# Patient Record
Sex: Female | Born: 1990 | Race: Black or African American | Hispanic: No | Marital: Single | Smoking: Former smoker
Health system: Southern US, Community
[De-identification: ages and names within clinical notes are randomized; demographics above are authoritative.]

## PROBLEM LIST (undated history)

## (undated) DIAGNOSIS — F419 Anxiety disorder, unspecified: Secondary | ICD-10-CM

## (undated) DIAGNOSIS — E669 Obesity, unspecified: Secondary | ICD-10-CM

## (undated) DIAGNOSIS — G43909 Migraine, unspecified, not intractable, without status migrainosus: Secondary | ICD-10-CM

## (undated) DIAGNOSIS — I10 Essential (primary) hypertension: Secondary | ICD-10-CM

## (undated) DIAGNOSIS — F329 Major depressive disorder, single episode, unspecified: Secondary | ICD-10-CM

## (undated) DIAGNOSIS — L83 Acanthosis nigricans: Secondary | ICD-10-CM

## (undated) DIAGNOSIS — K219 Gastro-esophageal reflux disease without esophagitis: Secondary | ICD-10-CM

## (undated) DIAGNOSIS — Z9151 Personal history of suicidal behavior: Secondary | ICD-10-CM

## (undated) DIAGNOSIS — N946 Dysmenorrhea, unspecified: Secondary | ICD-10-CM

## (undated) DIAGNOSIS — Z915 Personal history of self-harm: Secondary | ICD-10-CM

## (undated) DIAGNOSIS — T7840XA Allergy, unspecified, initial encounter: Secondary | ICD-10-CM

## (undated) DIAGNOSIS — F32A Depression, unspecified: Secondary | ICD-10-CM

## (undated) HISTORY — DX: Gastro-esophageal reflux disease without esophagitis: K21.9

## (undated) HISTORY — DX: Dysmenorrhea, unspecified: N94.6

## (undated) HISTORY — DX: Allergy, unspecified, initial encounter: T78.40XA

## (undated) HISTORY — DX: Acanthosis nigricans: L83

## (undated) HISTORY — DX: Anxiety disorder, unspecified: F41.9

## (undated) HISTORY — DX: Migraine, unspecified, not intractable, without status migrainosus: G43.909

## (undated) HISTORY — DX: Depression, unspecified: F32.A

## (undated) HISTORY — DX: Personal history of suicidal behavior: Z91.51

## (undated) HISTORY — DX: Obesity, unspecified: E66.9

## (undated) HISTORY — DX: Personal history of self-harm: Z91.5

## (undated) HISTORY — DX: Major depressive disorder, single episode, unspecified: F32.9

---
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
## 2011-01-15 ENCOUNTER — Observation Stay: Payer: Self-pay | Admitting: Emergency Medicine

## 2011-01-16 ENCOUNTER — Ambulatory Visit: Payer: Self-pay | Admitting: Obstetrics & Gynecology

## 2011-03-06 ENCOUNTER — Observation Stay: Payer: Self-pay | Admitting: Obstetrics & Gynecology

## 2011-03-19 ENCOUNTER — Inpatient Hospital Stay: Payer: Self-pay

## 2012-06-02 ENCOUNTER — Emergency Department: Payer: Self-pay | Admitting: Emergency Medicine

## 2012-06-03 LAB — COMPREHENSIVE METABOLIC PANEL
Anion Gap: 10 (ref 7–16)
Calcium, Total: 8.9 mg/dL (ref 8.5–10.1)
Chloride: 106 mmol/L (ref 98–107)
Co2: 25 mmol/L (ref 21–32)
EGFR (African American): >60
Osmolality: 279 (ref 275–301)
Potassium: 3.6 mmol/L (ref 3.5–5.1)
SGOT(AST): 22 U/L (ref 15–37)
Sodium: 141 mmol/L (ref 136–145)

## 2012-06-03 LAB — CBC
MCH: 30.2 pg (ref 26.0–34.0)
MCHC: 34.7 g/dL (ref 32.0–36.0)
Platelet: 318 10*3/uL (ref 150–440)
RBC: 4.44 10*6/uL (ref 3.80–5.20)
RDW: 13.8 % (ref 11.5–14.5)
WBC: 7.8 10*3/uL (ref 3.6–11.0)

## 2012-06-03 LAB — URINALYSIS, COMPLETE
Bacteria: NONE SEEN
Bilirubin,UR: NEGATIVE
Protein: 100
RBC,UR: 10161 /HPF (ref 0–5)
Specific Gravity: 1.028 (ref 1.003–1.030)
Squamous Epithelial: 9

## 2012-11-30 ENCOUNTER — Emergency Department: Payer: Self-pay | Admitting: Emergency Medicine

## 2013-01-06 LAB — URINALYSIS, COMPLETE
Bilirubin,UR: NEGATIVE
Ketone: NEGATIVE
Protein: 30
RBC,UR: 86 /HPF (ref 0–5)
Specific Gravity: 1.032 (ref 1.003–1.030)

## 2013-01-06 LAB — COMPREHENSIVE METABOLIC PANEL
Albumin: 3.7 g/dL (ref 3.4–5.0)
Alkaline Phosphatase: 115 U/L (ref 50–136)
BUN: 8 mg/dL (ref 7–18)
Calcium, Total: 8.7 mg/dL (ref 8.5–10.1)
Chloride: 105 mmol/L (ref 98–107)
Co2: 27 mmol/L (ref 21–32)
Creatinine: 0.84 mg/dL (ref 0.60–1.30)
EGFR (Non-African Amer.): >60
Glucose: 84 mg/dL (ref 65–99)
SGPT (ALT): 15 U/L (ref 12–78)
Sodium: 137 mmol/L (ref 136–145)

## 2013-01-06 LAB — CBC
MCH: 28.7 pg (ref 26.0–34.0)
MCHC: 34.2 g/dL (ref 32.0–36.0)
MCV: 84 fL (ref 80–100)
Platelet: 335 10*3/uL (ref 150–440)
RBC: 4.45 10*6/uL (ref 3.80–5.20)
RDW: 14 % (ref 11.5–14.5)
WBC: 8.6 10*3/uL (ref 3.6–11.0)

## 2013-01-06 LAB — DRUG SCREEN, URINE
Benzodiazepine, Ur Scrn: NEGATIVE (ref ?–200)
MDMA (Ecstasy)Ur Screen: NEGATIVE (ref ?–500)
Tricyclic, Ur Screen: NEGATIVE (ref ?–1000)

## 2013-01-06 LAB — ACETAMINOPHEN LEVEL: Acetaminophen: 16 ug/mL

## 2013-01-06 LAB — ETHANOL
Ethanol %: <0.003 % (ref 0.000–0.080)
Ethanol: <3 mg/dL

## 2013-01-07 ENCOUNTER — Inpatient Hospital Stay: Payer: Self-pay | Admitting: Psychiatry

## 2013-10-28 ENCOUNTER — Emergency Department: Payer: Self-pay | Admitting: Emergency Medicine

## 2013-12-30 ENCOUNTER — Emergency Department: Payer: Self-pay

## 2014-01-02 LAB — BETA STREP CULTURE(ARMC)

## 2014-05-26 LAB — HM PAP SMEAR: HM PAP: NORMAL

## 2014-08-12 ENCOUNTER — Emergency Department: Payer: Self-pay | Admitting: Emergency Medicine

## 2014-12-03 ENCOUNTER — Emergency Department (HOSPITAL_COMMUNITY)
Admission: EM | Admit: 2014-12-03 | Discharge: 2014-12-03 | Disposition: A | Discharge status: Home / Self Care | Payer: No Typology Code available for payment source | Attending: Emergency Medicine | Admitting: Emergency Medicine

## 2014-12-03 ENCOUNTER — Encounter (HOSPITAL_COMMUNITY): Payer: Self-pay | Admitting: Emergency Medicine

## 2014-12-03 ENCOUNTER — Emergency Department (HOSPITAL_COMMUNITY): Payer: No Typology Code available for payment source

## 2014-12-03 DIAGNOSIS — S4992XA Unspecified injury of left shoulder and upper arm, initial encounter: Secondary | ICD-10-CM | POA: Insufficient documentation

## 2014-12-03 DIAGNOSIS — R52 Pain, unspecified: Secondary | ICD-10-CM

## 2014-12-03 DIAGNOSIS — Y9389 Activity, other specified: Secondary | ICD-10-CM | POA: Diagnosis not present

## 2014-12-03 DIAGNOSIS — Y998 Other external cause status: Secondary | ICD-10-CM | POA: Insufficient documentation

## 2014-12-03 DIAGNOSIS — Y92410 Unspecified street and highway as the place of occurrence of the external cause: Secondary | ICD-10-CM | POA: Diagnosis not present

## 2014-12-03 DIAGNOSIS — M7918 Myalgia, other site: Secondary | ICD-10-CM

## 2014-12-03 DIAGNOSIS — S5012XA Contusion of left forearm, initial encounter: Secondary | ICD-10-CM | POA: Insufficient documentation

## 2014-12-03 MED ORDER — HYDROCODONE-ACETAMINOPHEN 5-325 MG PO TABS: ORAL_TABLET | ORAL | Status: DC

## 2014-12-03 MED ORDER — HYDROCODONE-ACETAMINOPHEN 5-325 MG PO TABS
1.0000 | ORAL_TABLET | Freq: Once | ORAL | Status: AC
  Administered 2014-12-03: 1 via ORAL
  Filled 2014-12-03: qty 1
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
## 2014-12-03 NOTE — ED Notes (Signed)
Patient is alert and orientedx4.  Patient was explained discharge instructions and they understood them with no questions.  The patient's mother, Kristina Mccann is taking the patient home.
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
## 2014-12-03 NOTE — Discharge Instructions (Signed)
Rest, Ice intermittently (in the first 24-48 hours), Gentle compression with an Ace wrap, and elevate (Limb above the level of the heart)   Take up to 800mg  of ibuprofen (that is usually 4 over the counter pills)  3 times a day for 5 days. Take with food.  Take vicodin for breakthrough pain, do not drink alcohol, drive, care for children or do other critical tasks while taking vicodin.  Do not hesitate to return to the emergency room for any new, worsening or concerning symptoms.  Please obtain primary care using resource guide below. Please let them know that you were seen in the emergency room and that they will need to obtain records for further outpatient management.     Musculoskeletal Pain Musculoskeletal pain is muscle and boney aches and pains. These pains can occur in any part of the body. Your caregiver may treat you without knowing the cause of the pain. They may treat you if blood or urine tests, X-rays, and other tests were normal.  CAUSES There is often not a definite cause or reason for these pains. These pains may be caused by a type of germ (virus). The discomfort may also come from overuse. Overuse includes working out too hard when your body is not fit. Boney aches also come from weather changes. Bone is sensitive to atmospheric pressure changes. HOME CARE INSTRUCTIONS   Ask when your test results will be ready. Make sure you get your test results.  Only take over-the-counter or prescription medicines for pain, discomfort, or fever as directed by your caregiver. If you were given medications for your condition, do not drive, operate machinery or power tools, or sign legal documents for 24 hours. Do not drink alcohol. Do not take sleeping pills or other medications that may interfere with treatment.  Continue all activities unless the activities cause more pain. When the pain lessens, slowly resume normal activities. Gradually increase the intensity and duration of the  activities or exercise.  During periods of severe pain, bed rest may be helpful. Lay or sit in any position that is comfortable.  Putting ice on the injured area.  Put ice in a bag.  Place a towel between your skin and the bag.  Leave the ice on for 15 to 20 minutes, 3 to 4 times a day.  Follow up with your caregiver for continued problems and no reason can be found for the pain. If the pain becomes worse or does not go away, it may be necessary to repeat tests or do additional testing. Your caregiver may need to look further for a possible cause. SEEK IMMEDIATE MEDICAL CARE IF:  You have pain that is getting worse and is not relieved by medications.  You develop chest pain that is associated with shortness or breath, sweating, feeling sick to your stomach (nauseous), or throw up (vomit).  Your pain becomes localized to the abdomen.  You develop any new symptoms that seem different or that concern you. MAKE SURE YOU:   Understand these instructions.  Will watch your condition.  Will get help right away if you are not doing well or get worse. Document Released: 07/30/2005 Document Revised: 10/22/2011 Document Reviewed: 04/03/2013 [facility] Patient Information 2015 [place], [place]. This information is not intended to replace advice given to you by your health care provider. Make sure you discuss any questions you have with your health care provider. Emergency Department Resource Guide 1) Find a Doctor and Pay Out of Pocket Although you won't have  to find out who is covered by your insurance plan, it is a good idea to ask around and get recommendations. You will then need to call the office and see if the doctor you have chosen will accept you as a new patient and what types of options they offer for patients who are self-pay. Some doctors offer discounts or will set up payment plans for their patients who do not have insurance, but you will need to ask so you aren't surprised when you  get to your appointment.  2) Contact Your Local Health Department Not all health departments have doctors that can see patients for sick visits, but many do, so it is worth a call to see if yours does. If you don't know where your local health department is, you can check in your phone book. The CDC also has a tool to help you locate your state's health department, and many state websites also have listings of all of their local health departments.  3) Find a Walk-in Clinic If your illness is not likely to be very severe or complicated, you may want to try a walk in clinic. These are popping up all over the country in pharmacies, drugstores, and shopping centers. They're usually staffed by nurse practitioners or physician assistants that have been trained to treat common illnesses and complaints. They're usually fairly quick and inexpensive. However, if you have serious medical issues or chronic medical problems, these are probably not your best option.  No Primary Care Doctor: - Call Health Connect at  [phone] - they can help you locate a primary care doctor that  accepts your insurance, provides certain services, etc. - Physician Referral Service- [phone]  Chronic Pain Problems: Organization         Address  Phone   Notes  [facility]  [phone] Patients need to be referred by their primary care doctor.   Medication Assistance: Organization         Address  Phone   Notes  [facility] Medication [facility] [address] [place] [place], [place] [address] [phone] --Must be a resident of [facility] -- Must have NO insurance coverage whatsoever (no Medicaid/ Medicare, etc.) -- The pt. MUST have a primary care doctor that directs their care regularly and follows them in the community   [facility]  [phone]   [employer]  [phone]    Agencies that provide inexpensive medical care: Organization         Address  Phone    Notes  [facility]  [phone]   [facility]    [phone]   [facility] [address] [place], [place] [address] [phone]   [facility] [address] [place], [place] [phone]   Planned Parenthood    [phone]   [facility]    [phone]   [facility]  [address], [place] Phone:  [phone], Fax:  [phone] Hours of Operation:  9 am - 6 pm, M-F.  Also accepts Medicaid/Medicare and self-pay.  [facility]  [address], [place] Phone: [phone], Fax: [phone]. Hours of Operation:  8:30 am - 5:30 pm, M-F.  Also accepts Medicaid and self-pay.  [facility] [place] [address], [employer] Phone: [phone]   [facility] [address] [place],  [place], [place] [phone], Ext. [phone] Mondays & Thursdays: 7-9 AM.  First 15 patients are seen on a first come, first serve basis.    Medicaid-accepting [facility] Providers:  Organization         Address  Phone   Notes  [facility] [address], [place] [phone] Also accepts self-pay patients.  [facility] [address], [place]  [phone]) [phone]   [facility] [address], [place] [phone]) [phone]   [facility] [address] [place] [address], [place] [phone]) [phone]   Renaye RakersVeita Bland [address], [place]   [phone] Only accepts [place] Access [place] patients after they have their name applied to their card.   Self-Pay (no insurance) in [facility]:  Organization         Address  Phone   Notes  Sickle Cell Patients, [facility] [address], [place] [phone]) [phone]   [facility] [address] [place], [place] [phone]) [phone]   [facility] [place]  [address], [place] [phone]) [phone]   [facility]/Dr. Osei-Bonsu  [address] [place] [address], [place] or [phone], [place] [phone] Phone number for both [place] and [place] locations is the same.  [facility] [address], [place] [phone]) [phone]   [facility] [address] [place] [address], [place] or [address] [phone](336) [phone]   [facility] [address], [place] [phone], phone; [phone]) [phone], fax Sees patients 1st and 3rd Saturday of every month.  Must not qualify for public or private insurance (i.e. Medicaid, Medicare, [place] Health Choice, Veterans' Benefits)  Household income should be no more than 200% of the poverty level The clinic cannot treat you if you are pregnant or think you are pregnant  Sexually transmitted diseases are not treated at the clinic.    Dental Care: Organization         Address  Phone  Notes  [facility] [address], [place] [phone] Accepts children up to age 23 who are enrolled in [place] or [place] Health Choice; pregnant women with a Medicaid card; and children who have applied for Medicaid or [place] Health Choice, but were declined, whose parents can pay a reduced fee at time of service.  [facility] [place]  [address], [place] [phone] Accepts children up to age 23 who are enrolled in [place] or [place] Health Choice; pregnant women with a Medicaid card; and children who have applied for Medicaid or [place] Health Choice, but were declined, whose parents can pay a reduced fee at time of service.  [facility]  [address], [place] [phone]) [phone] Patients are seen by appointment only. Walk-ins are not accepted. [facility] will see patients 23 years of age  and older. Monday - Tuesday (8am-5pm) Most Wednesdays (8:30-5pm) $30 per visit, cash only  [facility]  [address], [facility] [phone]) [phone] Patients are seen by appointment only. Walk-ins are not accepted. [facility] will see patients 23 years of age and older. One Wednesday Evening (Monthly: Volunteer Based).  $30 per visit, cash only  [employer]  Clinics  [phone] for adults; Children under age 51, call Graduate Pediatric Dentistry at [phone]. Children aged 77-14, please call [phone] to request a pediatric application.  Dental services are provided in all areas of dental care including fillings, crowns and bridges, complete and partial dentures, implants, gum treatment, root canals, and extractions. Preventive care is also provided. Treatment is provided to both adults and children. Patients are selected via a lottery and there is often a waiting list.   [facility] [address], [place]  [phone] [email or web address]   [facility] [address] [place] [address] [place], [place] [phone], Ext. [phone] Second and Fourth Thursday of each month, opens at 6:30 AM; Clinic ends at 9 AM.  Patients are seen on a first-come first-served basis, and a limited number are seen during each clinic.   [facility]  [address] [place], [place] [phone]   Eligibility Requirements You must have lived in [place], [place], or [place] counties for at least the last three months.   You cannot be eligible for state or federal sponsored [place], including [employer], [place], or [employer].   You generally cannot be eligible for healthcare insurance through your employer.    How to apply: Eligibility screenings are held every Tuesday and Wednesday afternoon from 1:00 pm until 4:00 pm. You do not need an appointment for the interview!  [facility] [address], [place], [place] [phone]   [facility] Health Department  [phone]   [facility] Health Department  [phone]   [facility] Health Department  [phone]    Behavioral Health Resources in the Community: Intensive Outpatient Programs Organization         Address  Phone  Notes  [facility] [address], [place], [place] [phone]   [facility] [address] [place], [place] [phone]   [facility] [address], [place], [place]  [phone]   [facility] Mental Health [address]  [place], [place] [phone] or [phone]   Substance Abuse Resources Organization         Address  Phone  Notes  [facility] Drug Services  9522689699   [facility]  [phone]   [facility]  [phone]   Floydene Flock  [phone]   Residential & Outpatient Substance Abuse Program  [phone]   Psychological Services Organization         Address  Phone  Notes  [facility] Behavioral Health  [phone]   [facility] Services  [phone]   [facility] Mental Health [address], [place] [phone] or [phone]    Mobile Crisis Teams Organization         Address  Phone  Notes  [facility], Mobile Crisis Care Unit  [phone]   [facility]  [address] [place], [place] [phone]   Doristine Locks [address] [place] [phone]    Self-Help/Support Groups Organization         Address  Phone             Notes  [facility] [place] - variety of support groups  336- I7437963 Call for more information  Narcotics Anonymous (NA), [facility] [address], [employer] [place]  2 meetings at this location   Chief Executive Officer  Notes  [facility] [address],    [place]  [phone]   [facility]  [address] [place] [address], [place], [place] [phone]   [facility] [address] [place], [place] [phone] Admissions: 8am-3pm M-F  [facility] [address]    [place], [place] [phone]   [facility] [address] [place], [place], [place] [phone]   [facility] [address]  [place], [place] [phone]   [facility] - Intensive Outpatient [address], [place], [place] [phone]   [facility] ([facility] [address] [place].,  [place], [place] [phone] or [phone]   [facility] ([facility]) [address], [place], [place] [phone] Accepts Medicaid  [facility] [address]  [place] [phone] Substance Abuse/Addiction Treatment   [facility] Organization         Address  Phone  Notes  [facility]  [phone]   Angie Fava, PhD [address] [place], [place]   [phone] or [phone]   [facility]   [address] [place], [place] [phone]   [facility] [address] [place], [place] [phone] Insurance/Medicaid/sponsorship through [facility] and Families [address]                                    [place], [place] [phone] Therapy/tele-psych/case  [facility] [address] [place] [address] [place], [place] [phone]    Dr. Lolly Mustache  [phone]   [facility]  [employer] [facility] Dept. 1) [address] [place] 2) [address], [place] 3)  [address], [place] [phone]  [phone]   [facility] Child Abuse Hotline 854-129-0629 or [phone] (After Hours)

## 2014-12-03 NOTE — Discharge Summary (Signed)
PATIENT NAME:  Kristina Mccann, Loraina L MR#:  [id number] DATE OF BIRTH:  Oct 16, 1990  DATE OF ADMISSION:  01/07/2013  DATE OF DISCHARGE:  01/09/2013  HOSPITAL COURSE:  See dictated history and physical for details of admission. A 24 year old woman who was admitted through the emergency room where she presented after taking an overdose of pain medicine. She was reporting long-standing stress and recently worsening depression with acute problems with breakup of her fiancee. The patient had taken an impulsive overdose, but then immediately had notified people and had cooperated with getting treatment. In the hospital, the patient denied any suicidal ideation. She recognized that her behavior was impulsive and inappropriate. She gave a history of long-standing depression and probably PTSD. She was willing to cooperate with treatment and involved in groups and individual therapy appropriately. She was started on Prozac 20 mg a day, which she has tolerated well. At the time of discharge, the patient is reporting that her mood is upbeat and no longer feeling depressed. She is denying suicidal or homicidal ideation and expressing optimism about going home. She is able to cite things in her life, mostly her child, that are important and worth living for. The patient is [facility]. She also has had a urinary tract infection that has been treated.   DISCHARGE MEDICATION:  1.  Prozac 20 mg a day.  2.  Trazodone 100 mg at night p.r.n. for sleep.  3.  Septra 1 tablet twice a day for 3 more days.   LABORATORY RESULTS:  Admission labs showed a normal CBC. Chemistry is just with a low AST and total protein slightly. Alcohol level undetectable. TSH normal at 1.9. Urinalysis:  Leukocyte esterase +3 plus, positive white and red blood cells and bacteria. Drug screen positive for opiates related to the medicine she had overdosed on. Salicylates and acetaminophen nontoxic. Pregnancy test negative.   MENTAL  STATUS EXAM AT DISCHARGE:  Neatly dressed and groomed young woman, looks her stated age, cooperative and pleasant in the interview. Good eye contact. Normal psychomotor activity. Speech normal in rate, tone and volume. Affect:  Smiling, upbeat, appropriate. Mood stated as good. Thoughts appear to be lucid, without any loosening of associations or delusions. Denies auditory or visual hallucinations. Denies suicidal or homicidal ideation currently. Shows good insight and judgment. Alert and oriented x 4. Normal intelligence.   DIAGNOSIS, PRINCIPAL AND PRIMARY:  AXIS I:  Major depression, recurrent, moderate.   SECONDARY DIAGNOSES: AXIS I:  Rule out posttraumatic stress disorder.   AXIS II:  Deferred.   AXIS III:  Urinary tract infection.   AXIS IV:  Severe from isolation, being a single mother, working.   AXIS V:  Functioning at time of evaluation and discharge 60.      ____________________________ Audery AmelJohn T. Asani Deniston, MD jtc:nts D: 01/09/2013 17:09:00 ET T: 01/10/2013 07:48:17 ET JOB#: [id number]  cc: Audery AmelJohn T. Amalya Salmons, MD, <Dictator> Audery AmelJOHN T Amyr Sluder MD ELECTRONICALLY SIGNED 01/12/2013 10:51

## 2015-01-26 ENCOUNTER — Emergency Department
Admission: EM | Admit: 2015-01-26 | Discharge: 2015-01-26 | Disposition: A | Discharge status: Home / Self Care | Payer: 59 | Attending: Emergency Medicine | Admitting: Emergency Medicine

## 2015-01-26 ENCOUNTER — Encounter: Payer: Self-pay | Admitting: Emergency Medicine

## 2015-01-26 DIAGNOSIS — N611 Abscess of the breast and nipple: Secondary | ICD-10-CM

## 2015-01-26 DIAGNOSIS — N61 Inflammatory disorders of breast: Secondary | ICD-10-CM | POA: Diagnosis present

## 2015-01-26 MED ORDER — SULFAMETHOXAZOLE-TRIMETHOPRIM 800-160 MG PO TABS: 1.0000 | ORAL_TABLET | Freq: Two times a day (BID) | ORAL | Status: DC

## 2015-01-26 NOTE — Discharge Instructions (Signed)

## 2015-01-26 NOTE — ED Provider Notes (Signed)
[facility] Emergency Department Provider Note  ____________________________________________  Time seen: Approximately 7:21 PM  I have reviewed the triage vital signs and the nursing notes.   HISTORY  Chief Complaint Abscess    HPI Kristina Mccann is a 24 y.o. female resistance to the emergency department for an abscess on the left breast. She states that it better for about a week and that on Sunday it popped and has been having clear or yellow drainage since then. She denies having had abscesses in the past. She reports that she is developing a second one under the breast. She tells me that the areas are not excessively painful.   History reviewed. No pertinent past medical history.  There are no active problems to display for this patient.   Past Surgical History  Procedure Laterality Date  . Cesarean section    . Breast lumpectomy Left 2008    Current Outpatient Rx  Name  Route  Sig  Dispense  Refill  . HYDROcodone-acetaminophen (NORCO/VICODIN) 5-325 MG per tablet      Take 1-2 tablets by mouth every 6 hours as needed for pain and/or cough.   7 tablet   0   . sulfamethoxazole-trimethoprim (BACTRIM DS,SEPTRA DS) 800-160 MG per tablet   Oral   Take 1 tablet by mouth 2 (two) times daily.   20 tablet   0     Allergies Review of patient's allergies indicates no known allergies.  No family history on file.  Social History History  Substance Use Topics  . Smoking status: Never Smoker   . Smokeless tobacco: Not on file  . Alcohol Use: No    Review of Systems   Constitutional: No fever/chills Eyes: No visual changes. ENT: No cough Cardiovascular: Denies chest pain. Respiratory: Denies shortness of breath. Gastrointestinal: No abdominal pain.  No nausea, no vomiting.  No diarrhea.  No constipation. Genitourinary: Negative for dysuria. Musculoskeletal: Negative for back pain. Skin: Abscesses to the left breast Neurological:  Negative for headaches, focal weakness or numbness.  10-point ROS otherwise negative.  ____________________________________________   PHYSICAL EXAM:  VITAL SIGNS: ED Triage Vitals  Enc Vitals Group     BP 01/26/15 1751 160/114 mmHg     Pulse Rate 01/26/15 1751 107     Resp 01/26/15 1751 19     Temp 01/26/15 1751 98.6 F (37 C)     Temp Source 01/26/15 1751 Oral     SpO2 01/26/15 1751 97 %     Weight 01/26/15 1751 204 lb (92.534 kg)     Height 01/26/15 1751  (1.575 m)     Head Cir --      Peak Flow --      Pain Score 01/26/15 1752 0     Pain Loc --      Pain Edu? --      Excl. in GC? --     Constitutional: Alert and oriented. Well appearing and in no acute distress. Eyes: Conjunctivae are normal. PERRL. EOMI. Head: Atraumatic. Nose: No congestion/rhinnorhea. Mouth/Throat: Mucous membranes are moist.  Oropharynx non-erythematous. No oral lesions. Neck: No stridor. Cardiovascular: Normal rate, regular rhythm.  Good peripheral circulation. Respiratory: Normal respiratory effort.  No retractions. Lungs CTAB. Gastrointestinal: Soft and nontender. No distention. No abdominal bruits.  Musculoskeletal: No lower extremity tenderness nor edema.  No joint effusions. Neurologic:  Normal speech and language. No gross focal neurologic deficits are appreciated. Speech is normal. No gait instability. Skin:  Rash no; 1 cm  abscess present to approximate 4:00 position outside of the aerola that is draining clear/purulent drainage--no induration or erythema outside of the abscess, less than 1 cm area of fluctuance present under her left breast; Negative for petechiae.  Psychiatric: Mood and affect are normal. Speech and behavior are normal.  ____________________________________________   LABS (all labs ordered are listed, but only abnormal results are displayed)  Labs Reviewed - No data to  display ____________________________________________  EKG   ____________________________________________  RADIOLOGY  Not indicated ____________________________________________   PROCEDURES  Procedure(s) performed: None ____________________________________________   INITIAL IMPRESSION / ASSESSMENT AND PLAN / ED COURSE  Pertinent labs & imaging results that were available during my care of the patient were reviewed by me and considered in my medical decision making (see chart for details).  Patient was given return precautions. She was advised to finish the antibiotics even if feeling better before she runs out. Patient declined pain medication today. ____________________________________________   FINAL CLINICAL IMPRESSION(S) / ED DIAGNOSES  Final diagnoses:  Breast abscess of female      Chinita Pester, FNP 01/26/15 2015  Minna Antis, MD 01/26/15 [phone]

## 2015-01-26 NOTE — ED Notes (Signed)
Pt with c/o bump to left nipple; reports it appeared 1 week ago, reports it "popped" Sunday, and has been leaking since.

## 2015-04-11 ENCOUNTER — Encounter: Payer: Self-pay | Admitting: Family Medicine

## 2015-04-11 ENCOUNTER — Ambulatory Visit (INDEPENDENT_AMBULATORY_CARE_PROVIDER_SITE_OTHER): Payer: 59 | Admitting: Family Medicine

## 2015-04-11 VITALS — BP 126/72 | HR 89 | Temp 98.5°F | Resp 16 | Ht 62.0 in | Wt 205.8 lb

## 2015-04-11 DIAGNOSIS — Z9151 Personal history of suicidal behavior: Secondary | ICD-10-CM | POA: Insufficient documentation

## 2015-04-11 DIAGNOSIS — J302 Other seasonal allergic rhinitis: Secondary | ICD-10-CM | POA: Insufficient documentation

## 2015-04-11 DIAGNOSIS — M25562 Pain in left knee: Secondary | ICD-10-CM | POA: Diagnosis not present

## 2015-04-11 DIAGNOSIS — Z23 Encounter for immunization: Secondary | ICD-10-CM

## 2015-04-11 DIAGNOSIS — IMO0002 Reserved for concepts with insufficient information to code with codable children: Secondary | ICD-10-CM | POA: Insufficient documentation

## 2015-04-11 DIAGNOSIS — F329 Major depressive disorder, single episode, unspecified: Secondary | ICD-10-CM | POA: Insufficient documentation

## 2015-04-11 DIAGNOSIS — Z915 Personal history of self-harm: Secondary | ICD-10-CM | POA: Insufficient documentation

## 2015-04-11 DIAGNOSIS — M25569 Pain in unspecified knee: Secondary | ICD-10-CM | POA: Insufficient documentation

## 2015-04-11 DIAGNOSIS — G43009 Migraine without aura, not intractable, without status migrainosus: Secondary | ICD-10-CM | POA: Insufficient documentation

## 2015-04-11 DIAGNOSIS — L83 Acanthosis nigricans: Secondary | ICD-10-CM | POA: Insufficient documentation

## 2015-04-11 DIAGNOSIS — K219 Gastro-esophageal reflux disease without esophagitis: Secondary | ICD-10-CM | POA: Insufficient documentation

## 2015-04-11 MED ORDER — MELOXICAM 15 MG PO TABS: 15.0000 mg | ORAL_TABLET | Freq: Every day | ORAL | Status: DC

## 2015-04-11 NOTE — Progress Notes (Signed)
Name: Kristina Mccann   MRN: [id number]    DOB: 03-27-1991   Date:04/11/2015       Progress Note  Subjective  Chief Complaint  Chief Complaint  Patient presents with  . Knee Pain    Onset-12 years ago, patient was at skating ring and fell, patient states her knee cracked and when outwards. Getting worst, intermittently pain/aching when squatting and using her knee bothers patient. Patient never had knee checked out when she was 12 from the fall.  Patient has used a knee brace with no relief.    HPI  Left Anterior Knee pain: Pain is described as an aching pain, constant. At rest the pain can go down to 2/10 but during activity up to 9/10. . Occasionally pain is sharp and shooting. No effusion or redness, no increase in warmth. Pain is worse with squatting, prolonged standing, walking , going up and down stairs. Sometimes knee locks, and is causing difficulty squatting.    Patient Active Problem List   Diagnosis Date Noted  . Acanthosis nigricans 04/11/2015  . Anxiety and depression 04/11/2015  . Gastro-esophageal reflux disease without esophagitis 04/11/2015  . Anterior knee pain 04/11/2015  . H/O suicide attempt 04/11/2015  . Migraine without aura and responsive to treatment 04/11/2015  . Adult BMI 30+ 04/11/2015  . Seasonal allergic rhinitis 04/11/2015    Past Surgical History  Procedure Laterality Date  . Cesarean section    . Breast lumpectomy Left 2008    Family History  Problem Relation Age of Onset  . Asthma Brother     Social History   Social History  . Marital Status: Single    Spouse Name: N/A  . Number of Children: N/A  . Years of Education: N/A   Occupational History  . Not on file.   Social History Main Topics  . Smoking status: Current Some Day Smoker -- 3 years    Types: Cigarettes    Start date: 04/10/2012  . Smokeless tobacco: Never Used  . Alcohol Use: No  . Drug Use: No  . Sexual Activity:    Partners: Male    Birth Control/ Protection:  Implant     Comment: Implanon   Other Topics Concern  . Not on file   Social History Narrative     Current outpatient prescriptions:  .  etonogestrel (IMPLANON) 68 MG IMPL implant, Inject into the skin., Disp: , Rfl:  .  meloxicam (MOBIC) 15 MG tablet, Take 1 tablet (15 mg total) by mouth daily., Disp: 30 tablet, Rfl: 0  No Known Allergies   ROS  Constitutional: Negative for fever or weight change.  Respiratory: Negative for cough and shortness of breath.   Cardiovascular: Negative for chest pain or palpitations.  Gastrointestinal: Negative for abdominal pain, no bowel changes.  Musculoskeletal: Negative for gait problem or joint swelling.  Skin: Negative for rash.  Neurological: Negative for dizziness or headache.  No other specific complaints in a complete review of systems (except as listed in HPI above).  Objective  Filed Vitals:   04/11/15 1211  BP: 126/72  Pulse: 89  Temp: 98.5 F (36.9 C)  TempSrc: Oral  Resp: 16  Height:  (1.575 m)  Weight: 205 lb 12.8 oz (93.35 kg)  SpO2: 99%    Body mass index is 37.63 kg/(m^2).  Physical Exam  Constitutional: Patient appears well-developed and well-nourished. Obese  No distress.  HEENT: head atraumatic, normocephalic, pupils equal and reactive to light,  neck supple, throat within normal  limits Cardiovascular: Normal rate, regular rhythm and normal heart sounds.  No murmur heard. No BLE edema. Pulmonary/Chest: Effort normal and breath sounds normal. No respiratory distress. Abdominal: Soft.  There is no tenderness. Psychiatric: Patient has a normal mood and affect. behavior is normal. Judgment and thought content normal. Muscular Skeletal: normal left knee exam, no effusion, normal rom, no griding Skin: acanthosis nigricans neck  PHQ2/9: Depression screen PHQ 2/9 04/11/2015  Decreased Interest 0  Down, Depressed, Hopeless 0  PHQ - 2 Score 0    Fall Risk: Fall Risk  04/11/2015  Falls in the past year? No     Assessment & Plan  1. Anterior knee pain, left History of trauma and pain going on for year, but getting worse, start nsaid's, discuss x-ray or referral to Ortho and she chose the later - meloxicam (MOBIC) 15 MG tablet; Take 1 tablet (15 mg total) by mouth daily.  Dispense: 30 tablet; Refill: 0 - Ambulatory referral to Orthopedic Surgery  2. Need for vaccination for H flu type B  - Flu Vaccine QUAD 36+ mos PF IM (Fluarix & Fluzone Quad PF)

## 2015-06-21 ENCOUNTER — Encounter: Payer: 59 | Admitting: Family Medicine

## 2016-03-13 LAB — HM HIV SCREENING LAB: HM HIV Screening: NEGATIVE

## 2016-03-20 ENCOUNTER — Encounter: Payer: Self-pay | Admitting: Family Medicine

## 2016-03-20 ENCOUNTER — Ambulatory Visit (INDEPENDENT_AMBULATORY_CARE_PROVIDER_SITE_OTHER): Payer: Self-pay | Admitting: Family Medicine

## 2016-03-20 VITALS — BP 122/102 | HR 85 | Temp 98.4°F | Resp 18 | Ht 62.0 in | Wt 199.1 lb

## 2016-03-20 DIAGNOSIS — Z9151 Personal history of suicidal behavior: Secondary | ICD-10-CM

## 2016-03-20 DIAGNOSIS — E668 Other obesity: Secondary | ICD-10-CM

## 2016-03-20 DIAGNOSIS — IMO0002 Reserved for concepts with insufficient information to code with codable children: Secondary | ICD-10-CM

## 2016-03-20 DIAGNOSIS — Z915 Personal history of self-harm: Secondary | ICD-10-CM

## 2016-03-20 DIAGNOSIS — Z9189 Other specified personal risk factors, not elsewhere classified: Secondary | ICD-10-CM

## 2016-03-20 DIAGNOSIS — R5383 Other fatigue: Secondary | ICD-10-CM

## 2016-03-20 DIAGNOSIS — F411 Generalized anxiety disorder: Secondary | ICD-10-CM

## 2016-03-20 DIAGNOSIS — F329 Major depressive disorder, single episode, unspecified: Secondary | ICD-10-CM

## 2016-03-20 DIAGNOSIS — Z113 Encounter for screening for infections with a predominantly sexual mode of transmission: Secondary | ICD-10-CM

## 2016-03-20 DIAGNOSIS — L83 Acanthosis nigricans: Secondary | ICD-10-CM

## 2016-03-20 LAB — COMPLETE METABOLIC PANEL WITH GFR
ALT: 8 U/L (ref 6–29)
AST: 13 U/L (ref 10–30)
Albumin: 3.8 g/dL (ref 3.6–5.1)
Alkaline Phosphatase: 83 U/L (ref 33–115)
BUN: 14 mg/dL (ref 7–25)
CHLORIDE: 102 mmol/L (ref 98–110)
CO2: 21 mmol/L (ref 20–31)
Calcium: 9.2 mg/dL (ref 8.6–10.2)
Creat: 0.95 mg/dL (ref 0.50–1.10)
GFR, Est Non African American: 84 mL/min (ref >=60)
Glucose, Bld: 79 mg/dL (ref 65–99)
POTASSIUM: 4.2 mmol/L (ref 3.5–5.3)
Sodium: 138 mmol/L (ref 135–146)
Total Bilirubin: 0.2 mg/dL (ref 0.2–1.2)
Total Protein: 7 g/dL (ref 6.1–8.1)

## 2016-03-20 LAB — CBC WITH DIFFERENTIAL/PLATELET
Basophils Absolute: 0 cells/uL (ref 0–200)
Basophils Relative: 0 %
Eosinophils Absolute: 231 cells/uL (ref 15–500)
Eosinophils Relative: 3 %
HEMATOCRIT: 37.2 % (ref 35.0–45.0)
Hemoglobin: 12.2 g/dL (ref 11.7–15.5)
LYMPHS PCT: 34 %
Lymphs Abs: 2618 cells/uL (ref 850–3900)
MCH: 28.7 pg (ref 27.0–33.0)
MCHC: 32.8 g/dL (ref 32.0–36.0)
MCV: 87.5 fL (ref 80.0–100.0)
MONO ABS: 462 {cells}/uL (ref 200–950)
MONOS PCT: 6 %
MPV: 10.6 fL (ref 7.5–12.5)
NEUTROS ABS: 4389 {cells}/uL (ref 1500–7800)
NEUTROS PCT: 57 %
PLATELETS: 331 10*3/uL (ref 140–400)
RBC: 4.25 MIL/uL (ref 3.80–5.10)
RDW: 14.3 % (ref 11.0–15.0)
WBC: 7.7 10*3/uL (ref 3.8–10.8)

## 2016-03-20 LAB — VITAMIN B12: VITAMIN B 12: 274 pg/mL (ref 200–1100)

## 2016-03-20 LAB — TSH: TSH: 1.09 mIU/L

## 2016-03-20 MED ORDER — DULOXETINE HCL 30 MG PO CPEP: 30.0000 mg | ORAL_CAPSULE | Freq: Every day | ORAL | 0 refills | Status: DC

## 2016-03-20 NOTE — Progress Notes (Signed)
Name: Kristina Mccann   MRN: [id number]    DOB: [birth year]   Date:03/20/2016       Progress Note  Subjective  Chief Complaint  Chief Complaint  Patient presents with  . Depression  . Anxiety    HPI  GAD/Major Depression: she has a history of major depression and suicide attempt. She is back with boyfriend Apolinar Junes ) . They were together for 3 years , they had a child together ( Amora ). He left when she was almost 3 years. They were separated for another 3 years, but back together since Feb. They moved to [place] 2 months ago. He is still there working and she came home because she was getting more depressed, and will return at the end of the month. She has seen a PCP in [place], but unable to afford the medication or counseling because she does not have Medicaid yet. She denies suicidal planning but feels like everyone would be better off without her.   Acanthosis Nigricans: she is obese, bp is elevated, she denies polyphagia, polyuria or polydipsia.   Obesity: she eats when stressed and is having a hard time controlling her intake, sometimes she skips meals because of the depression and lack of motivation to cook or prepare meals.    Patient Active Problem List   Diagnosis Date Noted  . Acanthosis nigricans 04/11/2015  . Major depression, chronic (HCC) 04/11/2015  . Gastro-esophageal reflux disease without esophagitis 04/11/2015  . Anterior knee pain 04/11/2015  . H/O suicide attempt 04/11/2015  . Migraine without aura and responsive to treatment 04/11/2015  . Adult BMI 30+ 04/11/2015  . Seasonal allergic rhinitis 04/11/2015    Past Surgical History:  Procedure Laterality Date  . BREAST LUMPECTOMY Left 2008  . CESAREAN SECTION      Family History  Problem Relation Age of Onset  . Asthma Brother     Social History   Social History  . Marital status: Single    Spouse name: N/A  . Number of children: N/A  . Years of education: N/A   Occupational History  . Not  on file.   Social History Main Topics  . Smoking status: Current Some Day Smoker    Years: 3.00    Types: Cigarettes    Start date: 04/10/2012  . Smokeless tobacco: Never Used  . Alcohol use No  . Drug use: No  . Sexual activity: Yes    Partners: Male    Birth control/ protection: Implant     Comment: Implanon   Other Topics Concern  . Not on file   Social History Narrative  . No narrative on file     Current Outpatient Prescriptions:  .  etonogestrel (IMPLANON) 68 MG IMPL implant, Inject into the skin., Disp: , Rfl:  .  DULoxetine (CYMBALTA) 30 MG capsule, Take 1-2 capsules (30-60 mg total) by mouth daily. Start with one capsule daily and increase to 2 pills after the first week, Disp: 60 capsule, Rfl: 0  No Known Allergies   ROS  Constitutional: Negative for fever or weight change.  Respiratory: Negative for cough and shortness of breath.   Cardiovascular: Negative for chest pain or palpitations.  Gastrointestinal: Negative for abdominal pain, no bowel changes.  Musculoskeletal: Negative for gait problem or joint swelling.  Skin: Negative for rash.  Neurological: Negative for dizziness or headache.  No other specific complaints in a complete review of systems (except as listed in HPI above).  Objective  Vitals:  03/20/16 1016  BP: (!) 122/102  Pulse: 85  Resp: 18  Temp: 98.4 F (36.9 C)  SpO2: 98%  Weight: 199 lb 1 oz (90.3 kg)  Height: 5\' 2"  (1.575 m)    Body mass index is 36.41 kg/m.  Physical Exam  Constitutional: Patient appears well-developed and well-nourished. Obese No distress.  HEENT: head atraumatic, normocephalic, pupils equal and reactive to light, neck supple, throat within normal limits Cardiovascular: Normal rate, regular rhythm and normal heart sounds.  No murmur heard. No BLE edema. Pulmonary/Chest: Effort normal and breath sounds normal. No respiratory distress. Abdominal: Soft.  There is no tenderness. Psychiatric: Patient has a  normal mood and affect. behavior is normal. Judgment and thought content normal.  PHQ2/9: Depression screen [facility] 2/9 03/20/2016 04/11/2015  Decreased Interest 2 0  Down, Depressed, Hopeless 3 0  PHQ - 2 Score 5 0  Altered sleeping 3 -  Tired, decreased energy 3 -  Change in appetite 2 -  Feeling bad or failure about yourself  2 -  Trouble concentrating 3 -  Moving slowly or fidgety/restless 2 -  Suicidal thoughts 1 -  PHQ-9 Score 21 -  Difficult doing work/chores Extremely dIfficult -    Fall Risk: Fall Risk  03/20/2016 04/11/2015  Falls in the past year? No No     Functional Status Survey: Is the patient deaf or have difficulty hearing?: No Does the patient have difficulty seeing, even when wearing glasses/contacts?: No Does the patient have difficulty concentrating, remembering, or making decisions?: No Does the patient have difficulty walking or climbing stairs?: No Does the patient have difficulty dressing or bathing?: No    Assessment & Plan  1. Major depression, chronic (HCC)  - DULoxetine (CYMBALTA) 30 MG capsule; Take 1-2 capsules (30-60 mg total) by mouth daily. Start with one capsule daily and increase to 2 pills after the first week  Dispense: 60 capsule; Refill: 0  2. H/O suicide attempt  She programed the text line hot line on her phone  3. Adult BMI 30+  - Hemoglobin A1c Discussed with the patient the risk posed by an increased BMI. Discussed importance of portion control, calorie counting and at least 150 minutes of physical activity weekly. Avoid sweet beverages and drink more water. Eat at least 6 servings of fruit and vegetables daily   4. Acanthosis nigricans  - Hemoglobin A1c  5. Routine screening for STI (sexually transmitted infection)  - HIV antibody - RPR - Chlamydia/Gonococcus/Trichomonas, NAA - HSV(herpes smplx)abs-1+2(IgG+IgM)-bld  6. Other fatigue  - CBC with Differential/Platelet - COMPLETE METABOLIC PANEL WITH GFR - TSH - VITAMIN  D 25 Hydroxy (Vit-D Deficiency, Fractures) - Vitamin B12  7. GAD (generalized anxiety disorder)  Discussed possible side effects  - DULoxetine (CYMBALTA) 30 MG capsule; Take 1-2 capsules (30-60 mg total) by mouth daily. Start with one capsule daily and increase to 2 pills after the first week  Dispense: 60 capsule; Refill: 0

## 2016-03-21 LAB — HEMOGLOBIN A1C
Hgb A1c MFr Bld: 5.1 % (ref <5.7)
Mean Plasma Glucose: 100 mg/dL

## 2016-03-21 LAB — HIV ANTIBODY (ROUTINE TESTING W REFLEX): HIV 1&2 Ab, 4th Generation: NONREACTIVE

## 2016-03-21 LAB — RPR

## 2016-03-21 LAB — HERPES SIMPLEX VIRUS 1/2 (IGG), CSF

## 2016-03-21 LAB — VITAMIN D 25 HYDROXY (VIT D DEFICIENCY, FRACTURES): VIT D 25 HYDROXY: 15 ng/mL — AB (ref 30–100)

## 2016-03-22 ENCOUNTER — Other Ambulatory Visit: Payer: Self-pay | Admitting: Family Medicine

## 2016-03-22 DIAGNOSIS — R768 Other specified abnormal immunological findings in serum: Secondary | ICD-10-CM | POA: Insufficient documentation

## 2016-03-22 DIAGNOSIS — E559 Vitamin D deficiency, unspecified: Secondary | ICD-10-CM

## 2016-03-22 DIAGNOSIS — E538 Deficiency of other specified B group vitamins: Secondary | ICD-10-CM | POA: Insufficient documentation

## 2016-03-22 DIAGNOSIS — R7689 Other specified abnormal immunological findings in serum: Secondary | ICD-10-CM | POA: Insufficient documentation

## 2016-03-22 LAB — CHLAMYDIA/GONOCOCCUS/TRICHOMONAS, NAA
Chlamydia by NAA: NEGATIVE
Gonococcus by NAA: NEGATIVE
Trich vag by NAA: NEGATIVE

## 2016-03-22 LAB — PLEASE NOTE

## 2016-03-22 LAB — HSV(HERPES SMPLX)ABS-I+II(IGG+IGM)-BLD
HSV 1 Glycoprotein G Ab, IgG: <0.9 Index (ref <0.90)
HSV 2 Glycoprotein G Ab, IgG: 10.7 Index — ABNORMAL HIGH (ref <0.90)

## 2016-03-22 MED ORDER — VITAMIN D (ERGOCALCIFEROL) 1.25 MG (50000 UNIT) PO CAPS: 50000.0000 [IU] | ORAL_CAPSULE | ORAL | 0 refills | Status: DC

## 2016-04-02 ENCOUNTER — Ambulatory Visit: Payer: Self-pay | Admitting: Family Medicine

## 2016-05-12 ENCOUNTER — Telehealth: Payer: Self-pay | Admitting: Family Medicine

## 2016-05-12 DIAGNOSIS — F411 Generalized anxiety disorder: Secondary | ICD-10-CM

## 2016-05-12 DIAGNOSIS — F329 Major depressive disorder, single episode, unspecified: Secondary | ICD-10-CM

## 2016-05-15 NOTE — Telephone Encounter (Signed)
Number disconnected.

## 2018-01-17 ENCOUNTER — Emergency Department
Admission: EM | Admit: 2018-01-17 | Discharge: 2018-01-17 | Disposition: A | Discharge status: Home / Self Care | Payer: Medicaid Other | Attending: Emergency Medicine | Admitting: Emergency Medicine

## 2018-01-17 ENCOUNTER — Other Ambulatory Visit: Payer: Self-pay

## 2018-01-17 DIAGNOSIS — R030 Elevated blood-pressure reading, without diagnosis of hypertension: Secondary | ICD-10-CM

## 2018-01-17 DIAGNOSIS — H9201 Otalgia, right ear: Secondary | ICD-10-CM | POA: Diagnosis present

## 2018-01-17 DIAGNOSIS — J0100 Acute maxillary sinusitis, unspecified: Secondary | ICD-10-CM | POA: Diagnosis not present

## 2018-01-17 DIAGNOSIS — F17210 Nicotine dependence, cigarettes, uncomplicated: Secondary | ICD-10-CM | POA: Insufficient documentation

## 2018-01-17 DIAGNOSIS — Z79899 Other long term (current) drug therapy: Secondary | ICD-10-CM | POA: Diagnosis not present

## 2018-01-17 MED ORDER — FEXOFENADINE-PSEUDOEPHED ER 60-120 MG PO TB12: 1.0000 | ORAL_TABLET | Freq: Two times a day (BID) | ORAL | 0 refills | Status: DC

## 2018-01-17 MED ORDER — IBUPROFEN 600 MG PO TABS: 600.0000 mg | ORAL_TABLET | Freq: Three times a day (TID) | ORAL | 0 refills | Status: DC | PRN

## 2018-01-17 MED ORDER — AMOXICILLIN 875 MG PO TABS: 875.0000 mg | ORAL_TABLET | Freq: Two times a day (BID) | ORAL | 0 refills | Status: DC

## 2018-01-17 NOTE — Discharge Instructions (Addendum)
Continue eardrops as directed. °

## 2018-01-17 NOTE — ED Notes (Signed)
See triage note  Presents with pain to right ear   Was seen earlier for same  dx'd with swimmer's ear  Now have some facial congestion and pressure  Afebrile on arrival

## 2018-01-17 NOTE — ED Triage Notes (Signed)
Right ear pain since Sunday, [facility] c/o facial congestion since. Pt alert and oriented X4, active, cooperative, pt in NAD. RR even and unlabored, color WNL.

## 2018-01-17 NOTE — ED Provider Notes (Signed)
[facility] Emergency Department Provider Note   ____________________________________________   First MD Initiated Contact with Patient 01/17/18 1010     (approximate)  I have reviewed the triage vital signs and the nursing notes.   HISTORY  Chief Complaint Otalgia    HPI Kristina Mccann is a 27 y.o. female patient complain 5 days right ear pain.  Patient states she was diagnosed with swimmer's ear by urgent care clinic 5 days ago.  Patient states since starting Ciprodex eardrops she has developed sinus congestion and increased ear pressure/pain.  Patient stated mild hearing loss.  Patient also complain of a sore throat secondary to postnasal drainage.  Patient denies cough.  Patient denies fever/chills.  Patient denies nausea, vomiting, diarrhea.  Patient continued  swimming with complaint.  Past Medical History:  Diagnosis Date  . Acanthosis nigricans   . Allergy   . Anxiety   . Depression   . Dysmenorrhea   . GERD (gastroesophageal reflux disease)   . History of suicide attempt   . Migraine without status migrainosus, not intractable   . Obesity     Patient Active Problem List   Diagnosis Date Noted  . 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 B12 deficiency 03/22/2016  . Vitamin D deficiency 03/22/2016  . Acanthosis nigricans 04/11/2015  . Major depression, chronic (HCC) 04/11/2015  . Gastro-esophageal reflux disease without esophagitis 04/11/2015  . Anterior knee pain 04/11/2015  . H/O suicide attempt 04/11/2015  . Migraine without aura and responsive to treatment 04/11/2015  . Adult BMI 30+ 04/11/2015  . Seasonal allergic rhinitis 04/11/2015    Past Surgical History:  Procedure Laterality Date  . BREAST LUMPECTOMY Left 2008  . CESAREAN SECTION      Prior to Admission medications   Medication Sig Start Date End Date Taking? Authorizing Provider  amoxicillin (AMOXIL) 875 MG tablet Take 1 tablet (875 mg total) by mouth 2 (two) times daily.  01/17/18   Joni Reining, PA-C  DULoxetine (CYMBALTA) 30 MG capsule Take 1-2 capsules (30-60 mg total) by mouth daily. Start with one capsule daily and increase to 2 pills after the first week 03/20/16   Alba Cory, MD  etonogestrel (IMPLANON) 68 MG IMPL implant Inject into the skin.    [provider]  fexofenadine-pseudoephedrine (ALLEGRA-D) 60-120 MG 12 hr tablet Take 1 tablet by mouth 2 (two) times daily. 01/17/18   Joni Reining, PA-C  ibuprofen (ADVIL,MOTRIN) 600 MG tablet Take 1 tablet (600 mg total) by mouth every 8 (eight) hours as needed. 01/17/18   Joni Reining, PA-C  Vitamin D, Ergocalciferol, (DRISDOL) 50000 units CAPS capsule Take 1 capsule (50,000 Units total) by mouth every 7 (seven) days. 03/22/16   Alba Cory, MD    Allergies Patient has no known allergies.  Family History  Problem Relation Age of Onset  . Asthma Brother     Social History Social History   Tobacco Use  . Smoking status: Current Some Day Smoker    Years: 3.00    Types: Cigarettes    Start date: 04/10/2012  . Smokeless tobacco: Never Used  Substance Use Topics  . Alcohol use: No    Alcohol/week: 0.0 oz  . Drug use: No    Review of Systems  Constitutional: No fever/chills Eyes: No visual changes. ENT: Right ear pain, nasal congestion, sore throat and sinus pressure. Cardiovascular: Denies chest pain. Respiratory: Denies shortness of breath. Gastrointestinal: No abdominal pain.  No nausea, no vomiting.  No diarrhea.  No constipation. Genitourinary: Negative for dysuria. Musculoskeletal: Negative for back pain. Skin: Negative for rash. Neurological: Negative for headaches, focal weakness or numbness. Psychiatric:Anxiety and depression   ____________________________________________   PHYSICAL EXAM:  VITAL SIGNS: ED Triage Vitals  Enc Vitals Group     BP 01/17/18 0958 (!) 165/114     Pulse Rate 01/17/18 0958 71     Resp 01/17/18 0958 18     Temp 01/17/18 0958 98.6  F (37 C)     Temp Source 01/17/18 0958 Oral     SpO2 01/17/18 0958 100 %     Weight 01/17/18 1003 212 lb (96.2 kg)     Height 01/17/18 1003 5\' 2"  (1.575 m)     Head Circumference --      Peak Flow --      Pain Score 01/17/18 1003 7     Pain Loc --      Pain Edu? --      Excl. in GC? --     Constitutional: Alert and oriented. Well appearing and in no acute distress. Eyes: Conjunctivae are normal. PERRL. EOMI. Head: Atraumatic. Nose: Bilateral maxillary guarding with edematous nasal turbinates.  EARS: Erythematous right ear canal with bilateral edematous TMs. Mouth/Throat: Mucous membranes are moist.  Oropharynx erythematous. Neck: No stridor. Hematological/Lymphatic/Immunilogical: No cervical lymphadenopathy. Cardiovascular: Normal rate, regular rhythm. Grossly normal heart sounds.  Good peripheral circulation.  Elevated blood pressure was rechecked and found to be within normal range. Respiratory: Normal respiratory effort.  No retractions. Lungs CTAB. Gastrointestinal: Soft and nontender. No distention. No abdominal bruits. No CVA tenderness. Musculoskeletal: No lower extremity tenderness nor edema.  No joint effusions. Neurologic:  Normal speech and language. No gross focal neurologic deficits are appreciated. No gait instability. Skin:  Skin is warm, dry and intact. No rash noted. Psychiatric: Mood and affect are normal. Speech and behavior are normal.  ____________________________________________   LABS (all labs ordered are listed, but only abnormal results are displayed)  Labs Reviewed - No data to display ____________________________________________  EKG   ____________________________________________  RADIOLOGY  ED MD interpretation:    Official radiology report(s): No results found.  ____________________________________________   PROCEDURES  Procedure(s) performed: None  Procedures  Critical Care performed:  No  ____________________________________________   INITIAL IMPRESSION / ASSESSMENT AND PLAN / ED COURSE  As part of my medical decision making, I reviewed the following data within the electronic MEDICAL RECORD NUMBER    Nasal and facial congestion secondary to sinusitis.  Right ear pain secondary to otitis external and sinus congestion.  Patient advised to continue using eyedrops given by urgent care.  Patient given prescription for Amoxil, Allegra-D, and ibuprofen.  Patient given discharge care instruction advised follow-up PCP.      ____________________________________________   FINAL CLINICAL IMPRESSION(S) / ED DIAGNOSES  Final diagnoses:  Otalgia of right ear  Subacute maxillary sinusitis  Elevated blood pressure reading     ED Discharge Orders        Ordered    amoxicillin (AMOXIL) 875 MG tablet  2 times daily     01/17/18 1017    fexofenadine-pseudoephedrine (ALLEGRA-D) 60-120 MG 12 hr tablet  2 times daily     01/17/18 1017    ibuprofen (ADVIL,MOTRIN) 600 MG tablet  Every 8 hours PRN     01/17/18 1017       Note:  This document was prepared using Dragon voice recognition software and may include unintentional dictation errors.    Joni ReiningSmith, Ashleyanne Hemmingway K, PA-C 01/17/18  1031    Schaevitz, Myra Rude, MD 01/17/18 1600

## 2018-01-20 ENCOUNTER — Ambulatory Visit: Payer: Self-pay | Admitting: Nurse Practitioner

## 2018-04-04 ENCOUNTER — Other Ambulatory Visit (HOSPITAL_COMMUNITY)
Admission: RE | Admit: 2018-04-04 | Discharge: 2018-04-04 | Disposition: A | Discharge status: Home / Self Care | Payer: Medicaid Other | Source: Ambulatory Visit | Attending: Family Medicine | Admitting: Family Medicine

## 2018-04-04 ENCOUNTER — Encounter: Payer: Self-pay | Admitting: Family Medicine

## 2018-04-04 ENCOUNTER — Ambulatory Visit (INDEPENDENT_AMBULATORY_CARE_PROVIDER_SITE_OTHER): Payer: Medicaid Other | Admitting: Family Medicine

## 2018-04-04 VITALS — BP 120/80 | HR 88 | Temp 98.1°F | Resp 16 | Ht 62.0 in | Wt 207.2 lb

## 2018-04-04 DIAGNOSIS — Z113 Encounter for screening for infections with a predominantly sexual mode of transmission: Secondary | ICD-10-CM | POA: Diagnosis not present

## 2018-04-04 DIAGNOSIS — L83 Acanthosis nigricans: Secondary | ICD-10-CM

## 2018-04-04 DIAGNOSIS — Z6837 Body mass index (BMI) 37.0-37.9, adult: Secondary | ICD-10-CM | POA: Insufficient documentation

## 2018-04-04 DIAGNOSIS — E6609 Other obesity due to excess calories: Secondary | ICD-10-CM | POA: Diagnosis not present

## 2018-04-04 DIAGNOSIS — Z0000 Encounter for general adult medical examination without abnormal findings: Secondary | ICD-10-CM | POA: Diagnosis not present

## 2018-04-04 DIAGNOSIS — Z114 Encounter for screening for human immunodeficiency virus [HIV]: Secondary | ICD-10-CM

## 2018-04-04 DIAGNOSIS — E559 Vitamin D deficiency, unspecified: Secondary | ICD-10-CM

## 2018-04-04 DIAGNOSIS — Z6841 Body Mass Index (BMI) 40.0 and over, adult: Secondary | ICD-10-CM | POA: Insufficient documentation

## 2018-04-04 DIAGNOSIS — Z5901 Sheltered homelessness: Secondary | ICD-10-CM | POA: Insufficient documentation

## 2018-04-04 DIAGNOSIS — Z111 Encounter for screening for respiratory tuberculosis: Secondary | ICD-10-CM

## 2018-04-04 DIAGNOSIS — Z591 Inadequate housing: Secondary | ICD-10-CM
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
## 2018-04-07 LAB — COMPLETE METABOLIC PANEL WITH GFR
AG Ratio: 1.1 (calc) (ref 1.0–2.5)
ALBUMIN MSPROF: 3.9 g/dL (ref 3.6–5.1)
ALKALINE PHOSPHATASE (APISO): 90 U/L (ref 33–115)
ALT: 8 U/L (ref 6–29)
AST: 14 U/L (ref 10–30)
BILIRUBIN TOTAL: 0.3 mg/dL (ref 0.2–1.2)
BUN: 8 mg/dL (ref 7–25)
CHLORIDE: 105 mmol/L (ref 98–110)
CO2: 26 mmol/L (ref 20–32)
Calcium: 9.3 mg/dL (ref 8.6–10.2)
Creat: 0.78 mg/dL (ref 0.50–1.10)
GFR, Est African American: 122 mL/min/{1.73_m2} (ref >=60)
GFR, Est Non African American: 105 mL/min/{1.73_m2} (ref >=60)
GLOBULIN: 3.7 g/dL (ref 1.9–3.7)
Glucose, Bld: 80 mg/dL (ref 65–139)
Potassium: 4.1 mmol/L (ref 3.5–5.3)
SODIUM: 140 mmol/L (ref 135–146)
Total Protein: 7.6 g/dL (ref 6.1–8.1)

## 2018-04-07 LAB — QUANTIFERON-TB GOLD PLUS
Mitogen-NIL: >10 IU/mL
NIL: 0.03 IU/mL
QUANTIFERON-TB GOLD PLUS: NEGATIVE
TB1-NIL: 0 [IU]/mL
TB2-NIL: 0 IU/mL

## 2018-04-07 LAB — HEMOGLOBIN A1C
EAG (MMOL/L): 6 (calc)
Hgb A1c MFr Bld: 5.4 % of total Hgb (ref <5.7)
Mean Plasma Glucose: 108 (calc)

## 2018-04-07 LAB — CBC WITH DIFFERENTIAL/PLATELET
BASOS ABS: 29 {cells}/uL (ref 0–200)
Basophils Relative: 0.5 %
Eosinophils Absolute: 108 cells/uL (ref 15–500)
Eosinophils Relative: 1.9 %
HCT: 38.9 % (ref 35.0–45.0)
Hemoglobin: 12.8 g/dL (ref 11.7–15.5)
Lymphs Abs: 2172 cells/uL (ref 850–3900)
MCH: 28.6 pg (ref 27.0–33.0)
MCHC: 32.9 g/dL (ref 32.0–36.0)
MCV: 86.8 fL (ref 80.0–100.0)
MPV: 10.8 fL (ref 7.5–12.5)
Monocytes Relative: 6.3 %
NEUTROS PCT: 53.2 %
Neutro Abs: 3032 cells/uL (ref 1500–7800)
PLATELETS: 415 10*3/uL — AB (ref 140–400)
RBC: 4.48 10*6/uL (ref 3.80–5.10)
RDW: 13.8 % (ref 11.0–15.0)
TOTAL LYMPHOCYTE: 38.1 %
WBC: 5.7 10*3/uL (ref 3.8–10.8)
WBCMIX: 359 {cells}/uL (ref 200–950)

## 2018-04-07 LAB — LIPID PANEL
CHOLESTEROL: 157 mg/dL (ref <200)
HDL: 41 mg/dL — AB (ref >50)
LDL CHOLESTEROL (CALC): 99 mg/dL
Non-HDL Cholesterol (Calc): 116 mg/dL (calc) (ref <130)
TRIGLYCERIDES: 84 mg/dL (ref <150)
Total CHOL/HDL Ratio: 3.8 (calc) (ref <5.0)

## 2018-04-07 LAB — CERVICOVAGINAL ANCILLARY ONLY
Chlamydia: NEGATIVE
NEISSERIA GONORRHEA: NEGATIVE
TRICH (WINDOWPATH): NEGATIVE

## 2018-04-07 LAB — HIV ANTIBODY (ROUTINE TESTING W REFLEX): HIV 1&2 Ab, 4th Generation: NONREACTIVE

## 2018-04-07 LAB — TSH: TSH: 0.66 mIU/L

## 2018-04-07 LAB — VITAMIN D 25 HYDROXY (VIT D DEFICIENCY, FRACTURES): VIT D 25 HYDROXY: 19 ng/mL — AB (ref 30–100)

## 2018-04-07 LAB — RPR: RPR: NONREACTIVE

## 2018-08-27 ENCOUNTER — Emergency Department
Admission: EM | Admit: 2018-08-27 | Discharge: 2018-08-27 | Disposition: A | Discharge status: Home / Self Care | Payer: Medicaid Other | Attending: Emergency Medicine | Admitting: Emergency Medicine

## 2018-08-27 ENCOUNTER — Encounter: Payer: Self-pay | Admitting: Emergency Medicine

## 2018-08-27 ENCOUNTER — Other Ambulatory Visit: Payer: Self-pay

## 2018-08-27 DIAGNOSIS — R05 Cough: Secondary | ICD-10-CM | POA: Diagnosis present

## 2018-08-27 DIAGNOSIS — J101 Influenza due to other identified influenza virus with other respiratory manifestations: Secondary | ICD-10-CM | POA: Diagnosis not present

## 2018-08-27 DIAGNOSIS — F17210 Nicotine dependence, cigarettes, uncomplicated: Secondary | ICD-10-CM | POA: Insufficient documentation

## 2018-08-27 LAB — INFLUENZA PANEL BY PCR (TYPE A & B)
INFLAPCR: POSITIVE — AB
Influenza B By PCR: NEGATIVE

## 2018-08-27 MED ORDER — OSELTAMIVIR PHOSPHATE 75 MG PO CAPS: 75.0000 mg | ORAL_CAPSULE | Freq: Two times a day (BID) | ORAL | 0 refills | Status: AC

## 2018-08-27 NOTE — Discharge Instructions (Addendum)
Follow-up with your primary care provider or can no clinic acute care if any continued problems.  Begin taking Tamiflu twice a day for the next 5 days.  Increase fluids.  You may take any over-the-counter medication for cough.  Tylenol or ibuprofen as needed for fever and body aches.  Discontinue or decrease smoking to prevent further respiratory problems.

## 2018-08-27 NOTE — ED Triage Notes (Signed)
Symptoms started Monday, appears in NAD.

## 2018-08-27 NOTE — ED Notes (Signed)
Signature pad not working. Pt verbally understood discharge prescriptions and follow up care.

## 2018-08-27 NOTE — ED Provider Notes (Signed)
[facility] Emergency Department Provider Note  ____________________________________________   First MD Initiated Contact with Patient 08/27/18 1129     (approximate)  I have reviewed the triage vital signs and the nursing notes.   HISTORY  Chief Complaint Cough; Headache; and Fever  HPI Kristina Mccann is a 28 y.o. female presents to the ED with complaint of sudden onset of symptoms Monday of cough, congestion, fever and body aches.  Patient did not get the flu vaccine.  She denies any other symptoms.  Rates her pain as 7 out of 10.  Past Medical History:  Diagnosis Date  . Acanthosis nigricans   . Allergy   . Anxiety   . Depression   . Dysmenorrhea   . GERD (gastroesophageal reflux disease)   . History of suicide attempt   . Migraine without status migrainosus, not intractable   . Obesity     Patient Active Problem List   Diagnosis Date Noted  . Class 2 obesity due to excess calories without serious comorbidity with body mass index (BMI) of 37.0 to 37.9 in adult 04/04/2018  . Lives in sheltered housing 04/04/2018  . 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 B12 deficiency 03/22/2016  . Vitamin D deficiency 03/22/2016  . Acanthosis nigricans 04/11/2015  . Major depression, chronic ([facility]) 04/11/2015  . Gastro-esophageal reflux disease without esophagitis 04/11/2015  . Anterior knee pain 04/11/2015  . H/O suicide attempt 04/11/2015  . Migraine without aura and responsive to treatment 04/11/2015  . Adult BMI 30+ 04/11/2015  . Seasonal allergic rhinitis 04/11/2015    Past Surgical History:  Procedure Laterality Date  . BREAST LUMPECTOMY Left 2008  . CESAREAN SECTION      Prior to Admission medications   Medication Sig Start Date End Date Taking? Authorizing Provider  etonogestrel (IMPLANON) 68 MG IMPL implant Inject into the skin.    [provider]  oseltamivir (TAMIFLU) 75 MG capsule Take 1 capsule (75 mg total) by mouth 2 (two) times  daily for 5 days. 08/27/18 09/01/18  Tommi RumpsSummers, Rhonda L, PA-C    Allergies Patient has no known allergies.  Family History  Problem Relation Age of Onset  . Stroke Father   . Diabetes Father   . Obesity Father   . Hypertension Father   . Asthma Brother   . Asthma Brother   . Asthma Brother     Social History Social History   Tobacco Use  . Smoking status: Current Some Day Smoker    Years: 3.00    Types: Cigarettes    Start date: 04/10/2012  . Smokeless tobacco: Never Used  . Tobacco comment: 1-2 black and milds a day  Substance Use Topics  . Alcohol use: No    Alcohol/week: 0.0 standard drinks  . Drug use: No    Review of Systems Constitutional: Positive fever/chills Eyes: No visual changes. ENT: No sore throat.  Positive nasal congestion. Cardiovascular: Denies chest pain. Respiratory: Denies shortness of breath.  Positive cough. Gastrointestinal: No abdominal pain.  No nausea, no vomiting.   Musculoskeletal: Positive for body aches. Skin: Negative for rash. Neurological: Negative for headaches, focal weakness or numbness. ___________________________________________   PHYSICAL EXAM:  VITAL SIGNS: ED Triage Vitals  Enc Vitals Group     BP 08/27/18 1132 (!) 162/87     Pulse Rate 08/27/18 1132 87     Resp --      Temp 08/27/18 1132 99.1 F (37.3 C)     Temp Source 08/27/18 1132 Oral  SpO2 08/27/18 1132 98 %     Weight 08/27/18 1122 200 lb (90.7 kg)     Height 08/27/18 1122 5' (1.524 m)     Head Circumference --      Peak Flow --      Pain Score 08/27/18 1121 7     Pain Loc --      Pain Edu? --      Excl. in GC? --    Constitutional: Alert and oriented. Well appearing and in no acute distress. Eyes: Conjunctivae are normal.  Head: Atraumatic. Nose: Mild congestion/rhinnorhea. Mouth/Throat: Mucous membranes are moist.  Oropharynx non-erythematous. Neck: No stridor.   Hematological/Lymphatic/Immunilogical: No cervical  lymphadenopathy. Cardiovascular: Normal rate, regular rhythm. Grossly normal heart sounds.  Good peripheral circulation. Respiratory: Normal respiratory effort.  No retractions. Lungs CTAB. Musculoskeletal: No lower extremity tenderness nor edema.  No joint effusions. Neurologic:  Normal speech and language. No gross focal neurologic deficits are appreciated. No gait instability. Skin:  Skin is warm, dry and intact. No rash noted. Psychiatric: Mood and affect are normal. Speech and behavior are normal.  ____________________________________________   LABS (all labs ordered are listed, but only abnormal results are displayed)  Labs Reviewed  INFLUENZA PANEL BY PCR (TYPE A & B) - Abnormal; Notable for the following components:      Result Value   Influenza A By PCR POSITIVE (*)    All other components within normal limits    PROCEDURES  Procedure(s) performed: None  Procedures  Critical Care performed: No  ____________________________________________   INITIAL IMPRESSION / ASSESSMENT AND PLAN / ED COURSE  As part of my medical decision making, I reviewed the following data within the electronic MEDICAL RECORD NUMBER Notes from prior ED visits and [place] Controlled Substance Database  Patient presents to the ED with complaint of sudden onset of symptoms suggestive of influenza.  Patient was positive for influenza A.  We discussed beginning increase fluids and decreasing her smoking.  She is to obtain over-the-counter cough medication and begin taking Tamiflu 75 mg twice daily for 5 days.  She was given a note to remain out of work until Monday.  She is to follow-up with her PCP or urgent care if needed.  ____________________________________________   FINAL CLINICAL IMPRESSION(S) / ED DIAGNOSES  Final diagnoses:  Influenza A     ED Discharge Orders         Ordered    oseltamivir (TAMIFLU) 75 MG capsule  2 times daily     08/27/18 1225           Note:  This document was  prepared using Dragon voice recognition software and may include unintentional dictation errors.    Tommi Rumps, PA-C 08/27/18 1233    Sharman Cheek, MD 08/28/18 [phone]

## 2019-02-25 ENCOUNTER — Other Ambulatory Visit: Payer: Self-pay

## 2019-02-25 ENCOUNTER — Ambulatory Visit (LOCAL_COMMUNITY_HEALTH_CENTER): Payer: Medicaid Other

## 2019-02-25 VITALS — BP 130/100 | Ht 63.0 in | Wt 213.0 lb

## 2019-02-25 DIAGNOSIS — Z0389 Encounter for observation for other suspected diseases and conditions ruled out: Secondary | ICD-10-CM | POA: Diagnosis not present

## 2019-02-25 DIAGNOSIS — B9689 Other specified bacterial agents as the cause of diseases classified elsewhere: Secondary | ICD-10-CM

## 2019-02-25 DIAGNOSIS — Z3009 Encounter for other general counseling and advice on contraception: Secondary | ICD-10-CM

## 2019-02-25 DIAGNOSIS — Z113 Encounter for screening for infections with a predominantly sexual mode of transmission: Secondary | ICD-10-CM

## 2019-02-25 DIAGNOSIS — Z1388 Encounter for screening for disorder due to exposure to contaminants: Secondary | ICD-10-CM | POA: Diagnosis not present

## 2019-02-25 DIAGNOSIS — Z3046 Encounter for surveillance of implantable subdermal contraceptive: Secondary | ICD-10-CM | POA: Diagnosis not present

## 2019-02-25 DIAGNOSIS — N760 Acute vaginitis: Secondary | ICD-10-CM | POA: Diagnosis not present

## 2019-02-25 LAB — WET PREP FOR TRICH, YEAST, CLUE
Trichomonas Exam: NEGATIVE
Yeast Exam: NEGATIVE

## 2019-02-25 MED ORDER — THERA VITAL M PO TABS: 1.0000 | ORAL_TABLET | Freq: Every day | ORAL | 0 refills | Status: DC

## 2019-02-25 MED ORDER — METRONIDAZOLE 500 MG PO TABS: 500.0000 mg | ORAL_TABLET | Freq: Two times a day (BID) | ORAL | 0 refills | Status: AC

## 2019-02-25 NOTE — Progress Notes (Signed)
Patient ID: Kristina Mccann, female   DOB: 24-Dec-1990, 28 y.o.   MRN: [id number]   [facility] problem visit  [place] Department  Subjective:  Kristina Mccann is a 28 y.o. being seen today for   Chief Complaint  Patient presents with  . Contraception    Nexplanon removal  . Exposure to STD    STD testing    HPI Client desire STD screen today   Does the patient have a current or past history of drug use? No   No components found for: HCV]   Health Maintenance Due  Topic Date Due  . PAP SMEAR-Modifier  05/27/2015  . PAP-Cervical Cytology Screening  05/26/2017    ROS  The following portions of the patient's history were reviewed and updated as appropriate: allergies, current medications, past family history, past medical history, past social history, past surgical history and problem list. Problem list updated.   See flowsheet for other program required questions.  Objective:   Vitals:   02/25/19 1127  BP: (!) 130/100  Weight: 213 lb (96.6 kg)  Height: 5\' 3"  (1.6 m)    Physical Exam Neck:     Musculoskeletal: Neck supple.  Abdominal:     Palpations: Abdomen is soft.     Tenderness: There is no abdominal tenderness.  Genitourinary:    General: Normal vulva.     Vagina: Vaginal discharge present.     Rectum: Normal.     Comments: Blood noted in vagina.  PH not Mccann Lymphadenopathy:     Cervical: No cervical adenopathy.  Neurological:     Mental Status: She is alert.     Assessment and Plan:  Kristina Mccann is a 28 y.o. female presenting to the [facility] Department for a Women's Health problem visit Nexplanon removed Client desires pregnancy.May use condoms until establishes reg menses.  Preconceptual counseling reviewed.  STD screen Treat wet prep for BV if + for clues/amine as per SO.  Elevated BP Needs to have BP f/u witjh PCP. There are no diagnoses linked to this encounter.    No follow-ups on  file.  No future appointments.  Kristina Done, FNP

## 2019-02-25 NOTE — Progress Notes (Signed)
Patient here for Nexplanon removal. Inserted at [place] 2015. Patient does not desire new form BCM. Plans pregnancy sometime in near future. Patient given pamphlet on planning a healthy pregnancy, MVI, and nexplanon care instructions. RN counseling done.Jenetta Downer, RN

## 2019-08-24 DIAGNOSIS — F17210 Nicotine dependence, cigarettes, uncomplicated: Secondary | ICD-10-CM | POA: Diagnosis not present

## 2019-08-24 DIAGNOSIS — K820 Obstruction of gallbladder: Secondary | ICD-10-CM | POA: Diagnosis not present

## 2019-08-24 DIAGNOSIS — Z79899 Other long term (current) drug therapy: Secondary | ICD-10-CM | POA: Insufficient documentation

## 2019-08-24 DIAGNOSIS — K5900 Constipation, unspecified: Secondary | ICD-10-CM | POA: Insufficient documentation

## 2019-08-24 DIAGNOSIS — R109 Unspecified abdominal pain: Secondary | ICD-10-CM | POA: Diagnosis present

## 2019-08-24 DIAGNOSIS — R1084 Generalized abdominal pain: Secondary | ICD-10-CM | POA: Diagnosis not present

## 2019-08-25 ENCOUNTER — Other Ambulatory Visit: Payer: Self-pay

## 2019-08-25 ENCOUNTER — Emergency Department
Admission: EM | Admit: 2019-08-25 | Discharge: 2019-08-25 | Disposition: A | Discharge status: Home / Self Care | Payer: Medicaid Other | Attending: Emergency Medicine | Admitting: Emergency Medicine

## 2019-08-25 ENCOUNTER — Emergency Department: Payer: Medicaid Other

## 2019-08-25 DIAGNOSIS — R1084 Generalized abdominal pain: Secondary | ICD-10-CM

## 2019-08-25 DIAGNOSIS — K820 Obstruction of gallbladder: Secondary | ICD-10-CM | POA: Diagnosis not present

## 2019-08-25 DIAGNOSIS — K5900 Constipation, unspecified: Secondary | ICD-10-CM

## 2019-08-25 LAB — COMPREHENSIVE METABOLIC PANEL
ALT: 17 U/L (ref 0–44)
AST: 21 U/L (ref 15–41)
Albumin: 3.7 g/dL (ref 3.5–5.0)
Alkaline Phosphatase: 89 U/L (ref 38–126)
Anion gap: 9 (ref 5–15)
BUN: 12 mg/dL (ref 6–20)
CO2: 27 mmol/L (ref 22–32)
Calcium: 9 mg/dL (ref 8.9–10.3)
Chloride: 106 mmol/L (ref 98–111)
Creatinine, Ser: 0.86 mg/dL (ref 0.44–1.00)
GFR calc Af Amer: >60 mL/min (ref >60)
GFR calc non Af Amer: >60 mL/min (ref >60)
Glucose, Bld: 119 mg/dL — ABNORMAL HIGH (ref 70–99)
Potassium: 4.2 mmol/L (ref 3.5–5.1)
Sodium: 142 mmol/L (ref 135–145)
Total Bilirubin: 0.4 mg/dL (ref 0.3–1.2)
Total Protein: 7.8 g/dL (ref 6.5–8.1)

## 2019-08-25 LAB — URINALYSIS, COMPLETE (UACMP) WITH MICROSCOPIC
Bacteria, UA: NONE SEEN
Bilirubin Urine: NEGATIVE
Glucose, UA: NEGATIVE mg/dL
Ketones, ur: NEGATIVE mg/dL
Nitrite: NEGATIVE
Protein, ur: 30 mg/dL — AB
RBC / HPF: >50 RBC/hpf — ABNORMAL HIGH (ref 0–5)
Specific Gravity, Urine: 1.013 (ref 1.005–1.030)
pH: 6 (ref 5.0–8.0)

## 2019-08-25 LAB — CBC
HCT: 38.9 % (ref 36.0–46.0)
Hemoglobin: 12.4 g/dL (ref 12.0–15.0)
MCH: 28.7 pg (ref 26.0–34.0)
MCHC: 31.9 g/dL (ref 30.0–36.0)
MCV: 90 fL (ref 80.0–100.0)
Platelets: 210 10*3/uL (ref 150–400)
RBC: 4.32 MIL/uL (ref 3.87–5.11)
RDW: 13.6 % (ref 11.5–15.5)
WBC: 9.2 10*3/uL (ref 4.0–10.5)
nRBC: 0 % (ref 0.0–0.2)

## 2019-08-25 LAB — POCT PREGNANCY, URINE: Preg Test, Ur: NEGATIVE

## 2019-08-25 LAB — LIPASE, BLOOD: Lipase: 31 U/L (ref 11–51)

## 2019-08-25 MED ORDER — MORPHINE SULFATE (PF) 4 MG/ML IV SOLN
4.0000 mg | Freq: Once | INTRAVENOUS | Status: AC
  Administered 2019-08-25: 4 mg via INTRAVENOUS
  Filled 2019-08-25: qty 1

## 2019-08-25 MED ORDER — IOHEXOL 300 MG/ML  SOLN
100.0000 mL | Freq: Once | INTRAMUSCULAR | Status: AC | PRN
  Administered 2019-08-25: 100 mL via INTRAVENOUS

## 2019-08-25 MED ORDER — DICYCLOMINE HCL 10 MG PO CAPS
10.0000 mg | ORAL_CAPSULE | Freq: Once | ORAL | Status: AC
  Administered 2019-08-25: 10 mg via ORAL
  Filled 2019-08-25: qty 1

## 2019-08-25 MED ORDER — ONDANSETRON HCL 4 MG/2ML IJ SOLN
4.0000 mg | INTRAMUSCULAR | Status: AC
  Administered 2019-08-25: 4 mg via INTRAVENOUS
  Filled 2019-08-25: qty 2

## 2019-08-25 MED ORDER — SODIUM CHLORIDE 0.9% FLUSH
3.0000 mL | Freq: Once | INTRAVENOUS | Status: AC
  Administered 2019-08-25: 03:00:00 3 mL via INTRAVENOUS

## 2019-08-25 MED ORDER — KETOROLAC TROMETHAMINE 30 MG/ML IJ SOLN
15.0000 mg | Freq: Once | INTRAMUSCULAR | Status: AC
  Administered 2019-08-25: 15 mg via INTRAVENOUS
  Filled 2019-08-25: qty 1

## 2019-08-25 NOTE — ED Triage Notes (Signed)
Pt presents via POV c/o abd pain x3 days, waxing and waning pain. Reports nausea.

## 2019-08-25 NOTE — Discharge Instructions (Signed)
You were seen in the emergency department today for abdominal pain that we believe is due to constipation.  We recommend that you use one or more of the following over-the-counter medications in the order described:   1)  Colace (or Dulcolax) 100 mg:  This is a stool softener, and you may take it once or twice a day as needed. 2)  Senna tablets:  This is a bowel stimulant that will help "push" out your stool. It is the next step to add after you have tried a stool softener. 3)  Miralax (powder):  This medication works by drawing additional fluid into your intestines and helps to flush out your stool.  Mix the powder with water or juice according to label instructions.  It may help if the Colace and Senna are not sufficient, but you must be sure to use the recommended amount of water or juice when you mix up the powder. 4)  Look for magnesium citrate at the pharmacy (it is usually a small glass bottle).  Drink the bottle according to the label instructions.  For now, until you get "cleaned out", I recommend you at least try a stool softener such as docusate and also use Miralax twice daily.  This will work!  Remember that narcotic pain medications are constipating, so avoid them or minimize their use.  Drink plenty of fluids.  Please return to the Emergency Department immediately if you develop new or worsening symptoms that concern you, such as (but not limited to) fever > 101 degrees, severe abdominal pain, or persistent vomiting.

## 2019-08-25 NOTE — ED Provider Notes (Signed)
[facility] Emergency Department Provider Note  ____________________________________________   First MD Initiated Contact with Patient 08/25/19 0244     (approximate)  I have reviewed the triage vital signs and the nursing notes.   HISTORY  Chief Complaint Abdominal Pain    HPI Zanai L Morell is a 29 y.o. female with medical issues as listed below who presents for evaluation of intermittent but gradually worsening abdominal pain over the last 3 days.  She says that it started acutely as a sharp stabbing pain in her left side and then would come and go over the last 3 days but is also been migrating to different parts of her abdomen.  Now she feels it in the lower middle part of her abdomen.  Is accompanied with nausea but no vomiting and no diarrhea.  It has been radiating to her back over the last few hours as well and it was keeping her from sleeping.  She describes it as severe, she is not able to find a position of comfort, and she said that nothing is helping.  She has been urinating more frequently than usual but has no burning with urination.  She is not currently on her menstrual period but it is almost time for it to begin.  She has no vaginal discomfort or discharge and no concerns for STDs.  She denies fever/chills, sore throat, chest pain, shortness of breath, and cough.         Past Medical History:  Diagnosis Date  . Acanthosis nigricans   . Allergy   . Anxiety   . Depression   . Dysmenorrhea   . GERD (gastroesophageal reflux disease)   . History of suicide attempt   . Migraine without status migrainosus, not intractable   . Obesity     Patient Active Problem List   Diagnosis Date Noted  . Class 2 obesity due to excess calories without serious comorbidity with body mass index (BMI) of 37.0 to 37.9 in adult 04/04/2018  . Lives in sheltered housing 04/04/2018  . 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 B12 deficiency 03/22/2016  . Vitamin D  deficiency 03/22/2016  . Acanthosis nigricans 04/11/2015  . Major depression, chronic (HCC) 04/11/2015  . Gastro-esophageal reflux disease without esophagitis 04/11/2015  . Anterior knee pain 04/11/2015  . H/O suicide attempt 04/11/2015  . Migraine without aura and responsive to treatment 04/11/2015  . Adult BMI 30+ 04/11/2015  . Seasonal allergic rhinitis 04/11/2015    Past Surgical History:  Procedure Laterality Date  . BREAST LUMPECTOMY Left 2008  . CESAREAN SECTION      Prior to Admission medications   Medication Sig Start Date End Date Taking? Authorizing Provider  etonogestrel (IMPLANON) 68 MG IMPL implant Inject into the skin.    [provider]  Multiple Vitamins-Minerals (MULTIVITAMIN) tablet Take 1 tablet by mouth daily. 02/25/19   Larene Pickett, FNP    Allergies Patient has no known allergies.  Family History  Problem Relation Age of Onset  . Migraines Mother   . Stroke Father   . Diabetes Father   . Obesity Father   . Hypertension Father   . Asthma Brother   . Asthma Brother   . Asthma Brother   . Diabetes Maternal Grandmother   . Hypertension Maternal Grandmother     Social History Social History   Tobacco Use  . Smoking status: Current Some Day Smoker    Years: 3.00    Types: Cigarettes    Start date:  04/10/2012  . Smokeless tobacco: Never Used  . Tobacco comment: 1-2 black and milds a day  Substance Use Topics  . Alcohol use: No    Alcohol/week: 0.0 standard drinks  . Drug use: No    Review of Systems Constitutional: No fever/chills Eyes: No visual changes. ENT: No sore throat. Cardiovascular: Denies chest pain. Respiratory: Denies shortness of breath. Gastrointestinal: Abdominal pain with nausea, as described above. Genitourinary: Negative for dysuria. Musculoskeletal: Some pain from her abdomen is radiating into her lower back. Integumentary: Negative for rash. Neurological: Negative for headaches, focal weakness or  numbness.   ____________________________________________   PHYSICAL EXAM:  VITAL SIGNS: ED Triage Vitals  Enc Vitals Group     BP 08/25/19 0005 (!) 149/116     Pulse Rate 08/25/19 0005 96     Resp 08/25/19 0005 20     Temp 08/25/19 0005 98.8 F (37.1 C)     Temp Source 08/25/19 0005 Oral     SpO2 08/25/19 0005 97 %     Weight 08/25/19 0006 97.5 kg (215 lb)     Height 08/25/19 0006 1.575 m (5\' 2" )     Head Circumference --      Peak Flow --      Pain Score 08/25/19 0010 8     Pain Loc --      Pain Edu? --      Excl. in [place]? --     Constitutional: Alert and oriented.  Appears uncomfortable but not in severe distress. Eyes: Conjunctivae are normal.  Head: Atraumatic. Nose: No congestion/rhinnorhea. Mouth/Throat: Patient is wearing a mask. Neck: No stridor.  No meningeal signs.   Cardiovascular: Normal rate, regular rhythm. Good peripheral circulation. Grossly normal heart sounds. Respiratory: Normal respiratory effort.  No retractions. Gastrointestinal: Obese, soft and nondistended.  Tender to palpation throughout but with more exquisite tenderness around the umbilicus as well as in the left lower quadrant.  Also tender in epigastrium but negative Murphy sign. Genitourinary: Deferred Musculoskeletal: No lower extremity tenderness nor edema. No gross deformities of extremities. Neurologic:  Normal speech and language. No gross focal neurologic deficits are appreciated.  Skin:  Skin is warm, dry and intact. Psychiatric: Mood and affect are normal. Speech and behavior are normal.  ____________________________________________   LABS (all labs ordered are listed, but only abnormal results are displayed)  Labs Reviewed  COMPREHENSIVE METABOLIC PANEL - Abnormal; Notable for the following components:      Result Value   Glucose, Bld 119 (*)    All other components within normal limits  URINALYSIS, COMPLETE (UACMP) WITH MICROSCOPIC - Abnormal; Notable for the following  components:   Color, Urine YELLOW (*)    APPearance HAZY (*)    Hgb urine dipstick LARGE (*)    Protein, ur 30 (*)    Leukocytes,Ua SMALL (*)    RBC / HPF >50 (*)    All other components within normal limits  LIPASE, BLOOD  CBC  POC URINE PREG, ED  POCT PREGNANCY, URINE   ____________________________________________  EKG  None - EKG not ordered by ED physician ____________________________________________  RADIOLOGY Ursula Alert, personally viewed and evaluated these images (plain radiographs) as part of my medical decision making, as well as reviewing the written report by the radiologist.  ED MD interpretation: No strong evidence of cholelithiasis or acute cholecystitis on ultrasound.  Substantial constipation on CT scan, no evidence of any other acute or emergent abnormality.  Official radiology report(s): CT abd/pelvis w/ IV contrast  Result Date: 08/25/2019 CLINICAL DATA:  Right flank pain EXAM: CT ABDOMEN AND PELVIS WITH CONTRAST TECHNIQUE: Multidetector CT imaging of the abdomen and pelvis was performed using the standard protocol following bolus administration of intravenous contrast. CONTRAST:  OMNIPAQUE IOHEXOL 300 MG/ML  SOLN COMPARISON:  None. FINDINGS: Lower chest: The visualized heart size within normal limits. No pericardial fluid/thickening. No hiatal hernia. The visualized portions of the lungs are clear. Hepatobiliary: The liver is normal in density without focal abnormality.The main portal vein is patent. No evidence of calcified gallstones, gallbladder wall thickening or biliary dilatation. Pancreas: Unremarkable. No pancreatic ductal dilatation or surrounding inflammatory changes. Spleen: Normal in size without focal abnormality. Adrenals/Urinary Tract: Both adrenal glands appear normal. No renal or collecting system calculi are seen. No hydronephrosis. The bladder is unremarkable. Stomach/Bowel: The stomach, small bowel, and colon are normal in appearance. No  inflammatory changes, wall thickening, or obstructive findings.The appendix is normal. There is a moderate to large amount of colonic stool present most notable in the cecum. There does appear to be fecalization of the distal ileal loops. Vascular/Lymphatic: There are no enlarged mesenteric, retroperitoneal, or pelvic lymph nodes. No significant vascular findings are present. Reproductive: The uterus and adnexa are unremarkable. Other: No evidence of abdominal wall mass or hernia. Musculoskeletal: No acute or significant osseous findings. IMPRESSION: No renal or collecting system calculi. Normal appearing appendix. Moderate to large amount of colonic stool most notable within the cecum with fecalization of the distal ileal loops which could be due to slow transit. Electronically Signed   By: Jonna Clark M.D.   On: 08/25/2019 04:20   US Abdomen Limited RUQ  Result Date: 08/25/2019 CLINICAL DATA:  29 year old female with abdominal pain and nausea for 3 days. EXAM: ULTRASOUND ABDOMEN LIMITED RIGHT UPPER QUADRANT COMPARISON:  None. FINDINGS: Gallbladder: Contracted gallbladder with no shadowing echogenic stones evident (image 13). No pericholecystic fluid. No sonographic Murphy sign elicited. Borderline gallbladder wall thickening. Common bile duct: Diameter: 2 millimeters, normal. Liver: No focal lesion identified. Within normal limits in parenchymal echogenicity. Portal vein is patent on color Doppler imaging with normal direction of blood flow towards the liver. Other: Negative visible right kidney. IMPRESSION: 1. Contracted gallbladder with no strong evidence of cholelithiasis or acute cholecystitis. 2. Negative liver.  No evidence of bile duct obstruction. Electronically Signed   By: Odessa Fleming M.D.   On: 08/25/2019 02:02    ____________________________________________   PROCEDURES   Procedure(s) performed (including Critical  Care):  Procedures   ____________________________________________   INITIAL IMPRESSION / MDM / ASSESSMENT AND PLAN / ED COURSE  As part of my medical decision making, I reviewed the following data within the electronic MEDICAL RECORD NUMBER Nursing notes reviewed and incorporated, Labs reviewed , Old chart reviewed and Notes from prior ED visits   Differential diagnosis includes, but is not limited to, biliary colic, renal/ureteral colic, UTI/pyelonephritis, diverticulitis, appendicitis, mesenteric adenitis, epiploic appendagitis, acid reflux.  The description of the patient's symptoms, intermittent over the last 3 days and now radiating to her back as well as some migration of the pain but having started on the left side, suggest to me that she may be having a kidney stone.  She also has hematuria but no report of a current menstrual cycle.  There is no strong indication of a urinary tract infection including no bacteria seen and nitrite negative urine.  Comprehensive metabolic panel is normal, urine pregnancy test is negative, lipase is normal, CBC is normal including no leukocytosis.  Ultrasound obtained while the patient was in the lobby shows no evidence of biliary disease.  Given that she is reporting severe pain and she has substantial tenderness palpation, I am obtaining a CT scan of the abdomen and pelvis with IV contrast.  She received morphine 4 g IV and Zofran 4 mg IV.      Clinical Course as of Aug 24 549  Tue Aug 25, 2019  2951 Patient feels better after getting a dose of Bentyl 10 mg by mouth and Toradol 15 mg IV.  I ordered these because the CT scan came back showing no evidence of an acute or emergent medical condition but what appears to be constipation which does make sense with the clinical presentation.  I updated the patient and she agrees that that is likely is that she has not had a bowel movement for a while.  I gave my usual and customary constipation management  recommendations and gave her return precautions.   [CF]    Clinical Course User Index [CF] Loleta Rose, MD     ____________________________________________  FINAL CLINICAL IMPRESSION(S) / ED DIAGNOSES  Final diagnoses:  Constipation, unspecified constipation type  Generalized abdominal pain     MEDICATIONS GIVEN DURING THIS VISIT:  Medications  sodium chloride flush (NS) 0.9 % injection 3 mL (3 mLs Intravenous Given by Other 08/25/19 0254)  morphine 4 MG/ML injection 4 mg (4 mg Intravenous Given 08/25/19 0323)  ondansetron (ZOFRAN) injection 4 mg (4 mg Intravenous Given 08/25/19 0323)  iohexol (OMNIPAQUE) 300 MG/ML solution 100 mL (100 mLs Intravenous Contrast Given 08/25/19 0406)  ketorolac (TORADOL) 30 MG/ML injection 15 mg (15 mg Intravenous Given 08/25/19 0500)  dicyclomine (BENTYL) capsule 10 mg (10 mg Oral Given 08/25/19 0459)     ED Discharge Orders    None      *Please note:  Marshia L Boatman was evaluated in Emergency Department on 08/25/2019 for the symptoms described in the history of present illness. She was evaluated in the context of the global COVID-19 pandemic, which necessitated consideration that the patient might be at risk for infection with the SARS-CoV-2 virus that causes COVID-19. Institutional protocols and algorithms that pertain to the evaluation of patients at risk for COVID-19 are in a state of rapid change based on information released by regulatory bodies including the CDC and federal and state organizations. These policies and algorithms were followed during the patient's care in the ED.  Some ED evaluations and interventions may be delayed as a result of limited staffing during the pandemic.*  Note:  This document was prepared using Dragon voice recognition software and may include unintentional dictation errors.   Loleta Rose, MD 08/25/19 [phone]

## 2019-11-18 DIAGNOSIS — Z5181 Encounter for therapeutic drug level monitoring: Secondary | ICD-10-CM | POA: Diagnosis not present

## 2019-12-16 DIAGNOSIS — Z5181 Encounter for therapeutic drug level monitoring: Secondary | ICD-10-CM | POA: Diagnosis not present

## 2020-01-06 DIAGNOSIS — Z5181 Encounter for therapeutic drug level monitoring: Secondary | ICD-10-CM | POA: Diagnosis not present

## 2020-01-13 DIAGNOSIS — Z5181 Encounter for therapeutic drug level monitoring: Secondary | ICD-10-CM | POA: Diagnosis not present

## 2020-01-20 DIAGNOSIS — Z5181 Encounter for therapeutic drug level monitoring: Secondary | ICD-10-CM | POA: Diagnosis not present

## 2020-01-27 DIAGNOSIS — Z5181 Encounter for therapeutic drug level monitoring: Secondary | ICD-10-CM | POA: Diagnosis not present

## 2020-02-03 DIAGNOSIS — Z5181 Encounter for therapeutic drug level monitoring: Secondary | ICD-10-CM | POA: Diagnosis not present

## 2020-02-10 DIAGNOSIS — Z5181 Encounter for therapeutic drug level monitoring: Secondary | ICD-10-CM | POA: Diagnosis not present

## 2020-02-11 DIAGNOSIS — Z419 Encounter for procedure for purposes other than remedying health state, unspecified: Secondary | ICD-10-CM | POA: Diagnosis not present

## 2020-03-13 DIAGNOSIS — Z419 Encounter for procedure for purposes other than remedying health state, unspecified: Secondary | ICD-10-CM | POA: Diagnosis not present

## 2020-04-13 DIAGNOSIS — Z419 Encounter for procedure for purposes other than remedying health state, unspecified: Secondary | ICD-10-CM | POA: Diagnosis not present

## 2020-05-13 DIAGNOSIS — Z419 Encounter for procedure for purposes other than remedying health state, unspecified: Secondary | ICD-10-CM | POA: Diagnosis not present

## 2020-06-13 DIAGNOSIS — Z419 Encounter for procedure for purposes other than remedying health state, unspecified: Secondary | ICD-10-CM | POA: Diagnosis not present

## 2020-07-13 DIAGNOSIS — Z419 Encounter for procedure for purposes other than remedying health state, unspecified: Secondary | ICD-10-CM | POA: Diagnosis not present

## 2020-07-26 NOTE — Progress Notes (Deleted)
Name: Kristina Mccann   MRN: [id number]    DOB: 07/10/1991   Date:07/26/2020       Progress Note  Subjective  Chief Complaint  Confirm Pregnancy  HPI  *** Patient Active Problem List   Diagnosis Date Noted  . Class 2 obesity due to excess calories without serious comorbidity with body mass index (BMI) of 37.0 to 37.9 in adult 04/04/2018  . Lives in sheltered housing 04/04/2018  . 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 B12 deficiency 03/22/2016  . Vitamin D deficiency 03/22/2016  . Acanthosis nigricans 04/11/2015  . Major depression, chronic (HCC) 04/11/2015  . Gastro-esophageal reflux disease without esophagitis 04/11/2015  . Anterior knee pain 04/11/2015  . H/O suicide attempt 04/11/2015  . Migraine without aura and responsive to treatment 04/11/2015  . Adult BMI 30+ 04/11/2015  . Seasonal allergic rhinitis 04/11/2015    Past Surgical History:  Procedure Laterality Date  . BREAST LUMPECTOMY Left 2008  . CESAREAN SECTION      Family History  Problem Relation Age of Onset  . Migraines Mother   . Stroke Father   . Diabetes Father   . Obesity Father   . Hypertension Father   . Asthma Brother   . Asthma Brother   . Asthma Brother   . Diabetes Maternal Grandmother   . Hypertension Maternal Grandmother     Social History   Tobacco Use  . Smoking status: Current Some Day Smoker    Years: 3.00    Types: Cigarettes    Start date: 04/10/2012  . Smokeless tobacco: Never Used  . Tobacco comment: 1-2 black and milds a day  Substance Use Topics  . Alcohol use: No    Alcohol/week: 0.0 standard drinks     Current Outpatient Medications:  .  cetirizine (ZYRTEC) 10 MG tablet, cetirizine 10 mg tablet, Disp: , Rfl:  .  etonogestrel (IMPLANON) 68 MG IMPL implant, Inject into the skin., Disp: , Rfl:  .  Multiple Vitamins-Minerals (MULTIVITAMIN) tablet, Take 1 tablet by mouth daily., Disp: 100 tablet, Rfl: 0  No Known Allergies  I personally reviewed {Reviewed:[id number]} with the  patient/caregiver today.   ROS  ***  Objective  There were no vitals filed for this visit.  There is no height or weight on file to calculate BMI.  Physical Exam ***  No results found for this or any previous visit (from the past 2160 hour(s)).  Diabetic Foot Exam: Diabetic Foot Exam - Simple   No data filed    ***  PHQ2/9: Depression screen [facility] 2/9 04/04/2018 03/20/2016 04/11/2015  Decreased Interest 0 2 0  Down, Depressed, Hopeless 0 3 0  PHQ - 2 Score 0 5 0  Altered sleeping 0 3 -  Tired, decreased energy 0 3 -  Change in appetite 0 2 -  Feeling bad or failure about yourself  0 2 -  Trouble concentrating 0 3 -  Moving slowly or fidgety/restless 0 2 -  Suicidal thoughts 0 1 -  PHQ-9 Score 0 21 -  Difficult doing work/chores Not difficult at all Extremely dIfficult -    phq 9 is {gen pos [id number]} ***  Fall Risk: Fall Risk  04/04/2018 03/20/2016 04/11/2015  Falls in the past year? No No No   ***   Functional Status Survey:   ***   Assessment & Plan  *** There are no diagnoses linked to this encounter.

## 2020-07-27 ENCOUNTER — Ambulatory Visit: Payer: Medicaid Other | Admitting: Family Medicine

## 2020-08-09 ENCOUNTER — Other Ambulatory Visit: Payer: Self-pay

## 2020-08-09 ENCOUNTER — Emergency Department: Payer: Medicaid Other

## 2020-08-09 ENCOUNTER — Encounter: Payer: Self-pay | Admitting: Emergency Medicine

## 2020-08-09 ENCOUNTER — Emergency Department
Admission: EM | Admit: 2020-08-09 | Discharge: 2020-08-09 | Disposition: A | Discharge status: Home / Self Care | Payer: Medicaid Other | Attending: Emergency Medicine | Admitting: Emergency Medicine

## 2020-08-09 DIAGNOSIS — O219 Vomiting of pregnancy, unspecified: Secondary | ICD-10-CM | POA: Diagnosis not present

## 2020-08-09 DIAGNOSIS — O131 Gestational [pregnancy-induced] hypertension without significant proteinuria, first trimester: Secondary | ICD-10-CM | POA: Diagnosis not present

## 2020-08-09 DIAGNOSIS — O210 Mild hyperemesis gravidarum: Secondary | ICD-10-CM | POA: Insufficient documentation

## 2020-08-09 DIAGNOSIS — Z3A09 9 weeks gestation of pregnancy: Secondary | ICD-10-CM | POA: Diagnosis not present

## 2020-08-09 DIAGNOSIS — I10 Essential (primary) hypertension: Secondary | ICD-10-CM | POA: Diagnosis not present

## 2020-08-09 DIAGNOSIS — O1214 Gestational proteinuria, complicating childbirth: Secondary | ICD-10-CM | POA: Diagnosis not present

## 2020-08-09 DIAGNOSIS — Z3A10 10 weeks gestation of pregnancy: Secondary | ICD-10-CM | POA: Diagnosis not present

## 2020-08-09 DIAGNOSIS — O99331 Smoking (tobacco) complicating pregnancy, first trimester: Secondary | ICD-10-CM | POA: Insufficient documentation

## 2020-08-09 DIAGNOSIS — R112 Nausea with vomiting, unspecified: Secondary | ICD-10-CM

## 2020-08-09 DIAGNOSIS — F17210 Nicotine dependence, cigarettes, uncomplicated: Secondary | ICD-10-CM | POA: Diagnosis not present

## 2020-08-09 DIAGNOSIS — R809 Proteinuria, unspecified: Secondary | ICD-10-CM

## 2020-08-09 DIAGNOSIS — O139 Gestational [pregnancy-induced] hypertension without significant proteinuria, unspecified trimester: Secondary | ICD-10-CM | POA: Diagnosis not present

## 2020-08-09 LAB — COMPREHENSIVE METABOLIC PANEL
ALT: 11 U/L (ref 0–44)
AST: 17 U/L (ref 15–41)
Albumin: 3.5 g/dL (ref 3.5–5.0)
Alkaline Phosphatase: 72 U/L (ref 38–126)
Anion gap: 12 (ref 5–15)
BUN: 8 mg/dL (ref 6–20)
CO2: 23 mmol/L (ref 22–32)
Calcium: 9.2 mg/dL (ref 8.9–10.3)
Chloride: 100 mmol/L (ref 98–111)
Creatinine, Ser: 0.69 mg/dL (ref 0.44–1.00)
GFR, Estimated: >60 mL/min (ref >60)
Glucose, Bld: 94 mg/dL (ref 70–99)
Potassium: 3.7 mmol/L (ref 3.5–5.1)
Sodium: 135 mmol/L (ref 135–145)
Total Bilirubin: 0.4 mg/dL (ref 0.3–1.2)
Total Protein: 8.2 g/dL — ABNORMAL HIGH (ref 6.5–8.1)

## 2020-08-09 LAB — URINALYSIS, COMPLETE (UACMP) WITH MICROSCOPIC
Bilirubin Urine: NEGATIVE
Glucose, UA: NEGATIVE mg/dL
Ketones, ur: 80 mg/dL — AB
Leukocytes,Ua: NEGATIVE
Nitrite: NEGATIVE
Protein, ur: 100 mg/dL — AB
RBC / HPF: >50 RBC/hpf — ABNORMAL HIGH (ref 0–5)
Specific Gravity, Urine: 1.03 (ref 1.005–1.030)
pH: 5 (ref 5.0–8.0)

## 2020-08-09 LAB — CBC
HCT: 37.6 % (ref 36.0–46.0)
Hemoglobin: 13 g/dL (ref 12.0–15.0)
MCH: 29.3 pg (ref 26.0–34.0)
MCHC: 34.6 g/dL (ref 30.0–36.0)
MCV: 84.9 fL (ref 80.0–100.0)
Platelets: 371 10*3/uL (ref 150–400)
RBC: 4.43 MIL/uL (ref 3.87–5.11)
RDW: 13.7 % (ref 11.5–15.5)
WBC: 11.3 10*3/uL — ABNORMAL HIGH (ref 4.0–10.5)
nRBC: 0 % (ref 0.0–0.2)

## 2020-08-09 LAB — HCG, QUANTITATIVE, PREGNANCY: hCG, Beta Chain, Quant, S: 135188 m[IU]/mL — ABNORMAL HIGH (ref <5)

## 2020-08-09 LAB — POC URINE PREG, ED: Preg Test, Ur: POSITIVE — AB

## 2020-08-09 MED ORDER — METOCLOPRAMIDE HCL 5 MG/ML IJ SOLN
10.0000 mg | Freq: Once | INTRAMUSCULAR | Status: AC
  Administered 2020-08-09: 10 mg via INTRAVENOUS
  Filled 2020-08-09: qty 2

## 2020-08-09 MED ORDER — METOCLOPRAMIDE HCL 10 MG PO TABS: 10.0000 mg | ORAL_TABLET | Freq: Three times a day (TID) | ORAL | 0 refills | Status: DC | PRN

## 2020-08-09 MED ORDER — DEXTROSE-NACL 5-0.9 % IV SOLN: INTRAVENOUS | Status: AC

## 2020-08-09 NOTE — ED Provider Notes (Signed)
[facility] Emergency Department Provider Note  ____________________________________________  Time seen: Approximately 4:06 AM  I have reviewed the triage vital signs and the nursing notes.   HISTORY  Chief Complaint Emesis During Pregnancy   HPI Kristina Mccann is a 29 y.o. female with a history of obesity, anxiety, depression, migraine  who is currently [redacted] weeks pregnant per LMP with her second pregnancy who presents for evaluation of nausea and vomiting.  Patient has had nausea and vomiting for several weeks which has been getting progressively worse.  Unable to keep anything down today.  She denies having any problems with nausea and vomiting with her first pregnancy which was a normal pregnancy.  She denies abdominal pain, vaginal discharge, vaginal bleeding, diarrhea or constipation, fever or chills, chest pain or shortness of breath, cough or congestion, dysuria or hematuria.  Patient has not established care for this pregnancy yet.  Past Medical History:  Diagnosis Date  . Acanthosis nigricans   . Allergy   . Anxiety   . Depression   . Dysmenorrhea   . GERD (gastroesophageal reflux disease)   . History of suicide attempt   . Migraine without status migrainosus, not intractable   . Obesity     Patient Active Problem List   Diagnosis Date Noted  . Class 2 obesity due to excess calories without serious comorbidity with body mass index (BMI) of 37.0 to 37.9 in adult 04/04/2018  . Lives in sheltered housing 04/04/2018  . 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 B12 deficiency 03/22/2016  . Vitamin D deficiency 03/22/2016  . Acanthosis nigricans 04/11/2015  . Major depression, chronic (HCC) 04/11/2015  . Gastro-esophageal reflux disease without esophagitis 04/11/2015  . Anterior knee pain 04/11/2015  . H/O suicide attempt 04/11/2015  . Migraine without aura and responsive to treatment 04/11/2015  . Adult BMI 30+ 04/11/2015  . Seasonal allergic rhinitis  04/11/2015    Past Surgical History:  Procedure Laterality Date  . BREAST LUMPECTOMY Left 2008  . CESAREAN SECTION      Prior to Admission medications   Medication Sig Start Date End Date Taking? Authorizing Provider  metoCLOPramide (REGLAN) 10 MG tablet Take 1 tablet (10 mg total) by mouth every 8 (eight) hours as needed for up to 3 days for nausea. 08/09/20 08/12/20 Yes Ronrico Dupin, [place], MD  cetirizine (ZYRTEC) 10 MG tablet cetirizine 10 mg tablet    [provider]  etonogestrel (IMPLANON) 68 MG IMPL implant Inject into the skin.    [provider]  Multiple Vitamins-Minerals (MULTIVITAMIN) tablet Take 1 tablet by mouth daily. 02/25/19   Larene Pickett, FNP    Allergies Patient has no known allergies.  Family History  Problem Relation Age of Onset  . Migraines Mother   . Stroke Father   . Diabetes Father   . Obesity Father   . Hypertension Father   . Asthma Brother   . Asthma Brother   . Asthma Brother   . Diabetes Maternal Grandmother   . Hypertension Maternal Grandmother     Social History Social History   Tobacco Use  . Smoking status: Current Some Day Smoker    Years: 3.00    Types: Cigarettes    Start date: 04/10/2012  . Smokeless tobacco: Never Used  . Tobacco comment: 1-2 black and milds a day  Vaping Use  . Vaping Use: Former  Substance Use Topics  . Alcohol use: No    Alcohol/week: 0.0 standard drinks  . Drug use: No  Review of Systems  Constitutional: Negative for fever. Eyes: Negative for visual changes. ENT: Negative for sore throat. Neck: No neck pain  Cardiovascular: Negative for chest pain. Respiratory: Negative for shortness of breath. Gastrointestinal: Negative for abdominal pain, diarrhea. + nausea and vomiting Genitourinary: Negative for dysuria. Musculoskeletal: Negative for back pain. Skin: Negative for rash. Neurological: Negative for headaches, weakness or numbness. Psych: No SI or  HI  ____________________________________________   PHYSICAL EXAM:  VITAL SIGNS: ED Triage Vitals  Enc Vitals Group     BP 08/09/20 0047 (!) 157/103     Pulse Rate 08/09/20 0047 91     Resp 08/09/20 0047 20     Temp 08/09/20 0047 99.4 F (37.4 C)     Temp Source 08/09/20 0047 Oral     SpO2 08/09/20 0047 95 %     Weight 08/09/20 0048 220 lb (99.8 kg)     Height 08/09/20 0048 5\' 2"  (1.575 m)     Head Circumference --      Peak Flow --      Pain Score 08/09/20 0047 0     Pain Loc --      Pain Edu? --      Excl. in GC? --     Constitutional: Alert and oriented. Well appearing and in no apparent distress. HEENT:      Head: Normocephalic and atraumatic.         Eyes: Conjunctivae are normal. Sclera is non-icteric.       Mouth/Throat: Mucous membranes are moist.       Neck: Supple with no signs of meningismus. Cardiovascular: Regular rate and rhythm. No murmurs, gallops, or rubs.  Respiratory: Normal respiratory effort. Lungs are clear to auscultation bilaterally.  Gastrointestinal: Soft, non tender, and non distended with positive bowel sounds. No rebound or guarding. Musculoskeletal:  No edema, cyanosis, or erythema of extremities. Neurologic: Normal speech and language. Face is symmetric. Moving all extremities. No gross focal neurologic deficits are appreciated. Skin: Skin is warm, dry and intact. No rash noted. Psychiatric: Mood and affect are normal. Speech and behavior are normal.  ____________________________________________   LABS (all labs ordered are listed, but only abnormal results are displayed)  Labs Reviewed  CBC - Abnormal; Notable for the following components:      Result Value   WBC 11.3 (*)    All other components within normal limits  COMPREHENSIVE METABOLIC PANEL - Abnormal; Notable for the following components:   Total Protein 8.2 (*)    All other components within normal limits  HCG, QUANTITATIVE, PREGNANCY - Abnormal; Notable for the following  components:   hCG, Beta Chain, Quant, S 135,188 (*)    All other components within normal limits  URINALYSIS, COMPLETE (UACMP) WITH MICROSCOPIC - Abnormal; Notable for the following components:   Color, Urine YELLOW (*)    APPearance CLOUDY (*)    Hgb urine dipstick LARGE (*)    Ketones, ur 80 (*)    Protein, ur 100 (*)    RBC / HPF >50 (*)    Bacteria, UA FEW (*)    All other components within normal limits  POC URINE PREG, ED - Abnormal; Notable for the following components:   Preg Test, Ur POSITIVE (*)    All other components within normal limits   ____________________________________________  EKG  none  ____________________________________________  RADIOLOGY  I have personally reviewed the images performed during this visit and I agree with the Radiologist's read.   Interpretation by Radiologist:  US OB LESS THAN 14 WEEKS WITH OB TRANSVAGINAL  Result Date: 08/09/2020 CLINICAL DATA:  Pregnant.  Nausea, vomiting.  LMP 05/29/2020 EXAM: OBSTETRIC <14 WK [place] AND TRANSVAGINAL OB US TECHNIQUE: Both transabdominal and transvaginal ultrasound examinations were performed for complete evaluation of the gestation as well as the maternal uterus, adnexal regions, and pelvic cul-de-sac. Transvaginal technique was performed to assess early pregnancy. COMPARISON:  None. FINDINGS: Intrauterine gestational sac: Present, single Yolk sac:  Not visualized Embryo:  Present, single Cardiac Activity: Present, regular Heart Rate: 180 bpm MSD: Appropriate given fetal size CRL:  32 mm   10 w   the 1 d                  [place] EDC: 03/06/2021 Subchorionic hemorrhage:  None visualized. Maternal uterus/adnexae: The uterus is retroflexed. No intrauterine masses are seen. The cervix is closed and is unremarkable. No free fluid seen within the cul-de-sac. The maternal ovaries are unremarkable. No adnexal masses are seen. IMPRESSION: Single living intrauterine gestation with an estimated gestational age of [redacted] weeks, 1  day. Electronically Signed   By: Helyn Numbers MD   On: 08/09/2020 05:11      ____________________________________________   PROCEDURES  Procedure(s) performed: None Procedures Critical Care performed:  None ____________________________________________   INITIAL IMPRESSION / ASSESSMENT AND PLAN / ED COURSE   30 y.o. female with a history of obesity, anxiety, depression, migraine  who is currently [redacted] weeks pregnant per LMP with her second pregnancy who presents for evaluation of nausea and vomiting for several weeks.  No prior history of hyperemesis with first pregnancy.  Here she is well-appearing and in no distress, abdomen is soft with no tenderness.  Vital signs show elevated BP of 165/90.  Patient denies any history of elevated blood pressure however review of epic shows that patient has had elevated blood pressure at least since 2016 on several different doctor visits.  Patient denies that she was ever told that her blood pressure was elevated.  She has no headache or changes in vision, no abdominal pain.  Pregnancy test is positive with a beta quant of 135,000.  UA shows ketones with blood and 100 protein.  Patient has had protein in her urine since January 2021.  At this time believe that elevated blood pressure and proteinuria are from chronic hypertension and not associated with her pregnancy at this time.  She has normal LFTs and normal platelets.  Presentation is most likely concerning with hyperemesis gravidarum.  Will give a liter of D5 normal and Reglan.  Will get ultrasound to evaluate the pregnancy.  Old medical records reviewed. No vaginal discharge or bleeding, no ab pain.  _________________________ 6:25 AM on 08/09/2020 -----------------------------------------  Patient feels markedly improved.  Passed p.o. challenge.  Ultrasound consistent with a 10-week pregnancy with no complications.  Recommended close follow-up with OB/GYN due to proteinuria and hypertension for close  monitoring.  Discussed my standard return precautions. patient be discharged home with a prescription for Reglan.    _____________________________________________ Please note:  Patient was evaluated in Emergency Department today for the symptoms described in the history of present illness. Patient was evaluated in the context of the global COVID-19 pandemic, which necessitated consideration that the patient might be at risk for infection with the SARS-CoV-2 virus that causes COVID-19. Institutional protocols and algorithms that pertain to the evaluation of patients at risk for COVID-19 are in a state of rapid change based on information released by regulatory bodies including the  CDC and federal and [employer] organizations. These policies and algorithms were followed during the patient's care in the ED.  Some ED evaluations and interventions may be delayed as a result of limited staffing during the pandemic.   [place] Controlled Substance Database was reviewed by me. ____________________________________________   FINAL CLINICAL IMPRESSION(S) / ED DIAGNOSES   Final diagnoses:  Hyperemesis gravidarum  Hypertension, unspecified type  Proteinuria, unspecified type      NEW MEDICATIONS STARTED DURING THIS VISIT:  ED Discharge Orders         Ordered    metoCLOPramide (REGLAN) 10 MG tablet  Every 8 hours PRN        08/09/20 0542           Note:  This document was prepared using Dragon voice recognition software and may include unintentional dictation errors.    Nita SickleVeronese, , MD 08/09/20 [phone]

## 2020-08-09 NOTE — ED Triage Notes (Signed)
Pt is approx [redacted] weeks pregnant and has had n.v. for a few weeks. NO vag bleeding, co generalized abd pain.

## 2020-08-09 NOTE — ED Notes (Signed)
Patient transported to Ultrasound 

## 2020-08-09 NOTE — ED Notes (Signed)
Pt provided with crackers and water for PO challenge.  

## 2020-08-09 NOTE — Discharge Instructions (Signed)
Your ultrasound shows a 10-week 1-day pregnancy with a due date of July 25.  Your blood pressure has been elevated which has been seen before as we discussed.  You also have some protein in your urine which could be seen with patients with chronic hypertension.  It is very important that you follow-up closely with your OB/GYN for management of this issue and to prevent any complications with the pregnancy.  I am giving a prescription for Reglan for nausea.  Please take it as prescribed.  Return to the emergency room if you have abdominal pain, vaginal bleeding, or if you're unable to keep food or drinks down.

## 2020-08-13 DIAGNOSIS — Z419 Encounter for procedure for purposes other than remedying health state, unspecified: Secondary | ICD-10-CM | POA: Diagnosis not present

## 2020-08-13 NOTE — L&D Delivery Note (Deleted)
Pt arrived from mother baby for NST. Due to difficulty monitoring pt, NST will take a longer time. See next shift nurses note for completion of test.  

## 2020-08-29 ENCOUNTER — Encounter: Payer: Medicaid Other | Admitting: Obstetrics

## 2020-09-02 ENCOUNTER — Other Ambulatory Visit (HOSPITAL_COMMUNITY)
Admission: RE | Admit: 2020-09-02 | Discharge: 2020-09-02 | Disposition: A | Discharge status: Home / Self Care | Payer: Medicaid Other | Source: Ambulatory Visit | Attending: Obstetrics | Admitting: Obstetrics

## 2020-09-02 ENCOUNTER — Other Ambulatory Visit: Payer: Self-pay

## 2020-09-02 ENCOUNTER — Encounter: Payer: Self-pay | Admitting: Obstetrics

## 2020-09-02 ENCOUNTER — Ambulatory Visit (INDEPENDENT_AMBULATORY_CARE_PROVIDER_SITE_OTHER): Payer: Medicaid Other | Admitting: Obstetrics

## 2020-09-02 VITALS — BP 130/90 | Wt 215.0 lb

## 2020-09-02 DIAGNOSIS — Z3A15 15 weeks gestation of pregnancy: Secondary | ICD-10-CM

## 2020-09-02 DIAGNOSIS — O0992 Supervision of high risk pregnancy, unspecified, second trimester: Secondary | ICD-10-CM | POA: Insufficient documentation

## 2020-09-02 DIAGNOSIS — Z3A13 13 weeks gestation of pregnancy: Secondary | ICD-10-CM | POA: Insufficient documentation

## 2020-09-02 DIAGNOSIS — O0990 Supervision of high risk pregnancy, unspecified, unspecified trimester: Secondary | ICD-10-CM | POA: Insufficient documentation

## 2020-09-02 LAB — OB RESULTS CONSOLE GC/CHLAMYDIA: Gonorrhea: NEGATIVE

## 2020-09-02 NOTE — Progress Notes (Signed)
New Obstetric Patient H&P    Chief Complaint: "Desires prenatal care"   History of Present Illness: Patient is a 30 y.o. G2P1001 Not Hispanic or Latino female, LMP 05/29/2020 presents with amenorrhea and positive home pregnancy test. Based on her  LMP, her EDD is Estimated Date of Delivery: 03/05/21 and her EGA is [redacted]w[redacted]d. Cycles are 5. days, irregular, and occur approximately every : 28 days. Her last pap smear was about one years ago and was no abnormalities.    She had a urine pregnancy test which was positive about 2 month(s)  ago. Her last menstrual period was normal and lasted for  about 5 day(s). Since her LMP she claims she has experienced fatigue and breast tenderness. She was initially quite nauseous , but this has ceased.. She denies vaginal bleeding. Her past medical history is noncontributory. Her prior pregnancies are notable for delivery via primary CS secondary to an active Herpes outbreak.  Since her LMP, she admits to the use of tobacco products  no She claims she has gained   no pounds since the start of her pregnancy.  There are cats in the home in the home  No She admits close contact with children on a regular basis  yes  She has had chicken pox in the past no She has had Tuberculosis exposures, symptoms, or previously tested positive for TB   no Current or past history of domestic violence. no  Genetic Screening/Teratology Counseling: (Includes patient, baby's father, or anyone in either family with:)   1. Patient's age >/= 93 [facility]  no 2. Thalassemia ([place], [place], Mediterranean, or Asian background): MCV<80  no 3. Neural tube defect (meningomyelocele, spina bifida, anencephaly)  no 4. Congenital heart defect  no  5. Down syndrome  no 6. Tay-Sachs (Jewish, [place]  no 7. Canavan's Disease  no 8. Sickle cell disease or trait (African)  no  9. Hemophilia or other blood disorders  no  10. Muscular dystrophy  no  11. Cystic fibrosis  no  12.  Huntington's Chorea  no  13. Mental retardation/autism  no 14. Other inherited genetic or chromosomal disorder  no 15. Maternal metabolic disorder (DM, PKU, etc)  no 16. Patient or FOB with a child with a birth defect not listed above no  16a. Patient or FOB with a birth defect themselves no 17. Recurrent pregnancy loss, or stillbirth  no  18. Any medications since LMP other than prenatal vitamins (include vitamins, supplements, OTC meds, drugs, alcohol)  no 19. Any other genetic/environmental exposure to discuss  no  Infection History:   1. Lives with someone with TB or TB exposed  no  2. Patient or partner has history of genital herpes  yes 3. Rash or viral illness since LMP  no 4. History of STI (GC, CT, HPV, syphilis, HIV)  Treated in the past for Trichomonas 5. History of recent travel :  no  Other pertinent information:  yes - she would very much like a TOL    Review of Systems:10 point review of systems negative unless otherwise noted in HPI  Past Medical History:  Past Medical History:  Diagnosis Date   Acanthosis nigricans    Allergy    Anxiety    Depression    Dysmenorrhea    GERD (gastroesophageal reflux disease)    History of suicide attempt    Migraine without status migrainosus, not intractable    Obesity     Past Surgical  History:  Past Surgical History:  Procedure Laterality Date   BREAST LUMPECTOMY Left 2008   CESAREAN SECTION      Gynecologic History: Patient's last menstrual period was 05/29/2020.  Obstetric History: G2P1001  Family History:  Family History  Problem Relation Age of Onset   Migraines Mother    Stroke Father    Diabetes Father    Obesity Father    Hypertension Father    Asthma Brother    Asthma Brother    Asthma Brother    Diabetes Maternal Grandmother    Hypertension Maternal Grandmother     Social History:  Social History   Socioeconomic History   Marital status: Single    Spouse name: Not  on file   Number of children: 1   Years of education: Not on file   Highest education level: High school graduate  Occupational History   Occupation: Case Production designer, theatre/television/film    Comment: [employer]   Occupation: Conservation officer, nature    Comment: Scientist, forensic Station  Tobacco Use   Smoking status: Former Smoker    Years: 3.00    Types: Cigarettes    Start date: 04/10/2012   Smokeless tobacco: Never Used   Tobacco comment: 1-2 black and milds a day  Vaping Use   Vaping Use: Former  Substance and Sexual Activity   Alcohol use: No    Alcohol/week: 0.0 standard drinks   Drug use: No   Sexual activity: Yes    Partners: Male    Birth control/protection: None  Other Topics Concern   Not on file  Social History Narrative   Not on file   Social Determinants of Health   Financial Resource Strain: Not on file  Food Insecurity: Not on file  Transportation Needs: Not on file  Physical Activity: Not on file  Stress: Not on file  Social Connections: Not on file  Intimate Partner Violence: Not on file    Allergies:  No Known Allergies  Medications: Prior to Admission medications   Medication Sig Start Date End Date Taking? Authorizing Provider  metoCLOPramide (REGLAN) 10 MG tablet Take 1 tablet (10 mg total) by mouth every 8 (eight) hours as needed for up to 3 days for nausea. 08/09/20 08/12/20  Nita Sickle, MD    Physical Exam Vitals: Blood pressure 130/90, weight 215 lb (97.5 kg), last menstrual period 05/29/2020.  General: NAD HEENT: normocephalic, anicteric Thyroid: no enlargement, no palpable nodules Pulmonary: No increased work of breathing, CTAB Cardiovascular: RRR, distal pulses 2+ Abdomen: NABS, soft, non-tender, non-distended.  Umbilicus without lesions.  No hepatomegaly, splenomegaly or masses palpable. No evidence of hernia  Genitourinary:  External: Normal external female genitalia.  Normal urethral meatus, normal  Bartholin's and Skene's glands.     Vagina: Normal vaginal mucosa, no evidence of prolapse.    Cervix: Grossly normal in appearance, no bleeding  Uterus: anteverted, enlarged, mobile, normal contour.  No CMT  Adnexa: ovaries non-enlarged, no adnexal masses  Rectal: deferred Extremities: no edema, erythema, or tenderness Neurologic: Grossly intact Psychiatric: mood appropriate, affect full Ample gynecoid pelvis   Assessment: 30 y.o. G2P1001 at [redacted]w[redacted]d presenting to initiate prenatal care  Plan: 1) Avoid alcoholic beverages. 2) Patient encouraged not to smoke.  3) Discontinue the use of all non-medicinal drugs and chemicals.  4) Take prenatal vitamins daily.  5) Nutrition, food safety (fish, cheese advisories, and high nitrite foods) and exercise discussed. 6) Hospital and practice style discussed with cross coverage system.  7) Genetic Screening, such as with  1st Trimester Screening, cell free fetal DNA, AFP testing, and Ultrasound, as well as with amniocentesis and CVS as appropriate, is discussed with patient. At the conclusion of today's visit patient undecided genetic testing. She will likely need additional education on her options. 8) Patient is asked about travel to areas at risk for the Zika virus, and counseled to avoid travel and exposure to mosquitoes or sexual partners who may have themselves been exposed to the virus. Testing is discussed, and will be ordered as appropriate.   We discussed her HSV status, and she knows to start Valtrex at [redacted] weeks gestation. Advised to gain 15-20 lbs during the pregnancy. Dietary counsel provided to assist her with this. RTC in 1-2 weeks for a dating sono, bloodwork, and possible genetic testing as she desires. Pap and cultures today as well as urine culture. Mirna Mires, CNM  09/02/2020 4:57 PM

## 2020-09-02 NOTE — Progress Notes (Signed)
NOB- flu shot today, no concerns 

## 2020-09-04 LAB — URINE CULTURE

## 2020-09-06 LAB — CYTOLOGY - PAP
Chlamydia: NEGATIVE
Comment: NEGATIVE
Comment: NEGATIVE
Comment: NORMAL
Diagnosis: NEGATIVE
Neisseria Gonorrhea: NEGATIVE
Trichomonas: NEGATIVE

## 2020-09-08 ENCOUNTER — Ambulatory Visit (INDEPENDENT_AMBULATORY_CARE_PROVIDER_SITE_OTHER): Payer: Medicaid Other

## 2020-09-08 ENCOUNTER — Encounter: Payer: Self-pay | Admitting: Obstetrics and Gynecology

## 2020-09-08 ENCOUNTER — Other Ambulatory Visit: Payer: Self-pay

## 2020-09-08 ENCOUNTER — Ambulatory Visit (INDEPENDENT_AMBULATORY_CARE_PROVIDER_SITE_OTHER): Payer: Medicaid Other | Admitting: Obstetrics and Gynecology

## 2020-09-08 VITALS — BP 112/70 | Wt 219.0 lb

## 2020-09-08 DIAGNOSIS — O99342 Other mental disorders complicating pregnancy, second trimester: Secondary | ICD-10-CM | POA: Diagnosis not present

## 2020-09-08 DIAGNOSIS — Z3A14 14 weeks gestation of pregnancy: Secondary | ICD-10-CM | POA: Diagnosis not present

## 2020-09-08 DIAGNOSIS — O0972 Supervision of high risk pregnancy due to social problems, second trimester: Secondary | ICD-10-CM | POA: Diagnosis not present

## 2020-09-08 DIAGNOSIS — O99212 Obesity complicating pregnancy, second trimester: Secondary | ICD-10-CM | POA: Diagnosis not present

## 2020-09-08 DIAGNOSIS — O0990 Supervision of high risk pregnancy, unspecified, unspecified trimester: Secondary | ICD-10-CM

## 2020-09-08 DIAGNOSIS — O99213 Obesity complicating pregnancy, third trimester: Secondary | ICD-10-CM | POA: Insufficient documentation

## 2020-09-08 DIAGNOSIS — Z1379 Encounter for other screening for genetic and chromosomal anomalies: Secondary | ICD-10-CM

## 2020-09-08 DIAGNOSIS — Z3A13 13 weeks gestation of pregnancy: Secondary | ICD-10-CM

## 2020-09-08 DIAGNOSIS — Z3149 Encounter for other procreative investigation and testing: Secondary | ICD-10-CM

## 2020-09-08 DIAGNOSIS — Z6841 Body Mass Index (BMI) 40.0 and over, adult: Secondary | ICD-10-CM

## 2020-09-08 DIAGNOSIS — Z130 Encounter for screening for diseases of the blood and blood-forming organs and certain disorders involving the immune mechanism: Secondary | ICD-10-CM

## 2020-09-08 LAB — POCT URINALYSIS DIPSTICK OB
Glucose, UA: NEGATIVE
POC,PROTEIN,UA: NEGATIVE

## 2020-09-08 NOTE — Progress Notes (Signed)
Routine Prenatal Care Visit  Subjective  Kristina Mccann is a 30 y.o. G2P1001 at [redacted]w[redacted]d being seen today for ongoing prenatal care.  She is currently monitored for the following issues for this high-risk pregnancy and has Acanthosis nigricans; Major depression, chronic (HCC); Gastro-esophageal reflux disease without esophagitis; Anterior knee pain; H/O suicide attempt; Migraine without aura and responsive to treatment; Seasonal allergic rhinitis; HSV-2 seropositive; B12 deficiency; Vitamin D deficiency; Lives in sheltered housing; Supervision of high risk pregnancy, antepartum; Maternal morbid obesity, antepartum, second trimester (HCC); and BMI 40.0-44.9, adult (HCC) on their problem list.  ----------------------------------------------------------------------------------- Patient reports no complaints.  Patient states she has experienced an major improvement in sx of N/V.  . Vag. Bleeding: None.   . Denies leaking of fluid.  ----------------------------------------------------------------------------------- The following portions of the patient's history were reviewed and updated as appropriate: allergies, current medications, past family history, past medical history, past social history, past surgical history and problem list. Problem list updated.   Objective  Blood pressure 112/70, weight 219 lb (99.3 kg), last menstrual period 05/29/2020. Pregravid weight 220 lb (99.8 kg) Total Weight Gain -1 lb (-0.454 kg) Urinalysis:      Fetal Status: Fetal Heart Rate (bpm): 163          [place]:  Findings:  Singleton intrauterine pregnancy is visualized with a CRL consistent with [redacted]w[redacted]d gestation, giving an (U/S) EDD of 03/04/2021. The (U/S) EDD is consistent with the clinically established EDD of 03/05/2021. FHR: 163 BPM The EFW: 102 g ( 4 oz ) Yolk sac is not visualized. Amnion: not visualized  Right Ovary is normal in appearance. Left Ovary is normal appearance. Corpus luteal cyst:  is not  visualized Survey of the adnexa demonstrates no adnexal masses. There is no free peritoneal fluid in the cul de sac.  General:  Alert, oriented and cooperative. Patient is in no acute distress.  Skin: Skin is warm and dry. No rash noted.   Cardiovascular: Normal heart rate noted  Respiratory: Normal respiratory effort, no problems with respiration noted  Abdomen: Soft, gravid, appropriate for gestational age.       Pelvic:  Cervical exam deferred        Extremities: Normal range of motion.     Mental Status: Normal mood and affect. Normal behavior. Normal judgment and thought content.     Assessment   30 y.o. G2P1001 at [redacted]w[redacted]d by  03/05/2021, by Last Menstrual Period presenting for routine prenatal visit  Plan   SECOND Problems (from 09/02/20 to present)    Problem Noted Resolved   Supervision of high risk pregnancy, antepartum 09/02/2020 by Mirna Mires, CNM No   Overview Addendum 09/07/2020 11:45 AM by Mirna Mires, CNM     Clinic  Prenatal Labs  Dating  Blood type:     Genetic Screen 1 Screen:    AFP:     Quad:     NIPS: Antibody:   Anatomic [place]  Rubella:    GTT Early:               Third trimester:  RPR:     Flu vaccine  HBsAg:     TDaP vaccine                                               Rhogam: HIV:     [employer]  GBS: (For PCN allergy, check sensitivities)  Contraception  Pap: NILM  Circumcision    Pediatrician    Support Person              Previous Version       -NOB labs, A1C, Inheritest, and NIPS collected today -Reviewed dating [place] - EDD c/w with LMP  Second trimester precautions including but not limited to vaginal bleeding, contractions, leaking of fluid and fetal movement were reviewed in detail with the patient.    Return in about 4 weeks (around 10/06/2020) for ROB with anat scan.  Zipporah Plants, CNM, MSN [facility], [facility] 09/08/2020, 11:44 AM

## 2020-09-09 ENCOUNTER — Encounter: Payer: Self-pay | Admitting: Obstetrics

## 2020-09-09 ENCOUNTER — Telehealth: Payer: Self-pay

## 2020-09-09 NOTE — Telephone Encounter (Signed)
Pt left msg on triage asking if she can get a note for work stating she can wait later on in her pregnancy to get her covid vaccine. Says she had talked with [facility] and was advised to wait later on in her prengancy. Please advise.

## 2020-09-09 NOTE — Telephone Encounter (Signed)
Sent a note to the patient recommending she is now in her second trimester and can get the COVID vaccine pas recommended by [employer].

## 2020-09-12 LAB — HGB FRACTIONATION CASCADE
Hgb A2: 2.6 % (ref 1.8–3.2)
Hgb A: 97.4 % (ref 96.4–98.8)
Hgb F: 0 % (ref 0.0–2.0)
Hgb S: 0 %

## 2020-09-12 LAB — HGB A1C W/O EAG: Hgb A1c MFr Bld: 5.4 % (ref 4.8–5.6)

## 2020-09-12 NOTE — Telephone Encounter (Signed)
Called pt to let her know letter via mychart sent, no answer, left detailed msg.

## 2020-09-13 DIAGNOSIS — Z419 Encounter for procedure for purposes other than remedying health state, unspecified: Secondary | ICD-10-CM | POA: Diagnosis not present

## 2020-09-13 LAB — MATERNIT 21 PLUS CORE, BLOOD
Fetal Fraction: 7
Result (T21): NEGATIVE
Trisomy 13 (Patau syndrome): NEGATIVE
Trisomy 18 (Edwards syndrome): NEGATIVE
Trisomy 21 (Down syndrome): NEGATIVE

## 2020-09-18 ENCOUNTER — Encounter: Payer: Self-pay | Admitting: Obstetrics

## 2020-09-18 DIAGNOSIS — F1290 Cannabis use, unspecified, uncomplicated: Secondary | ICD-10-CM | POA: Insufficient documentation

## 2020-09-18 LAB — URINE DRUG PANEL 7
Amphetamines, Urine: NEGATIVE ng/mL
Barbiturate Quant, Ur: NEGATIVE ng/mL
Benzodiazepine Quant, Ur: NEGATIVE ng/mL
Cannabinoid Quant, Ur: POSITIVE — AB
Cocaine (Metab.): NEGATIVE ng/mL
Opiate Quant, Ur: NEGATIVE ng/mL
PCP Quant, Ur: NEGATIVE ng/mL

## 2020-09-22 DIAGNOSIS — O99512 Diseases of the respiratory system complicating pregnancy, second trimester: Secondary | ICD-10-CM | POA: Diagnosis not present

## 2020-09-22 DIAGNOSIS — K219 Gastro-esophageal reflux disease without esophagitis: Secondary | ICD-10-CM | POA: Diagnosis not present

## 2020-09-22 DIAGNOSIS — O99612 Diseases of the digestive system complicating pregnancy, second trimester: Secondary | ICD-10-CM | POA: Diagnosis not present

## 2020-09-22 DIAGNOSIS — J029 Acute pharyngitis, unspecified: Secondary | ICD-10-CM | POA: Diagnosis not present

## 2020-09-22 DIAGNOSIS — Z3A16 16 weeks gestation of pregnancy: Secondary | ICD-10-CM | POA: Diagnosis not present

## 2020-10-04 ENCOUNTER — Ambulatory Visit: Payer: Medicaid Other

## 2020-10-06 ENCOUNTER — Encounter: Payer: Self-pay | Admitting: Obstetrics and Gynecology

## 2020-10-06 ENCOUNTER — Ambulatory Visit: Payer: Self-pay

## 2020-10-06 DIAGNOSIS — O0990 Supervision of high risk pregnancy, unspecified, unspecified trimester: Secondary | ICD-10-CM

## 2020-10-07 ENCOUNTER — Other Ambulatory Visit: Payer: Self-pay | Admitting: Obstetrics and Gynecology

## 2020-10-07 DIAGNOSIS — O99212 Obesity complicating pregnancy, second trimester: Secondary | ICD-10-CM

## 2020-10-07 DIAGNOSIS — Z3689 Encounter for other specified antenatal screening: Secondary | ICD-10-CM

## 2020-10-07 LAB — INHERITEST CORE(CF97,SMA,FRAX)

## 2020-10-10 ENCOUNTER — Other Ambulatory Visit: Payer: Self-pay

## 2020-10-10 ENCOUNTER — Ambulatory Visit
Admission: RE | Admit: 2020-10-10 | Discharge: 2020-10-10 | Disposition: A | Discharge status: Home / Self Care | Payer: Medicaid Other | Source: Ambulatory Visit | Attending: Obstetrics and Gynecology | Admitting: Obstetrics and Gynecology

## 2020-10-10 ENCOUNTER — Encounter: Payer: Self-pay | Admitting: Obstetrics and Gynecology

## 2020-10-10 DIAGNOSIS — Z3689 Encounter for other specified antenatal screening: Secondary | ICD-10-CM

## 2020-10-10 DIAGNOSIS — Z3A15 15 weeks gestation of pregnancy: Secondary | ICD-10-CM | POA: Diagnosis not present

## 2020-10-10 DIAGNOSIS — O99212 Obesity complicating pregnancy, second trimester: Secondary | ICD-10-CM | POA: Diagnosis not present

## 2020-10-11 ENCOUNTER — Encounter: Payer: Self-pay | Admitting: Obstetrics and Gynecology

## 2020-10-11 ENCOUNTER — Ambulatory Visit (INDEPENDENT_AMBULATORY_CARE_PROVIDER_SITE_OTHER): Payer: Medicaid Other | Admitting: Obstetrics

## 2020-10-11 VITALS — BP 118/72 | Ht 62.0 in | Wt 218.2 lb

## 2020-10-11 DIAGNOSIS — Z6841 Body Mass Index (BMI) 40.0 and over, adult: Secondary | ICD-10-CM

## 2020-10-11 DIAGNOSIS — Z3A19 19 weeks gestation of pregnancy: Secondary | ICD-10-CM

## 2020-10-11 DIAGNOSIS — O0990 Supervision of high risk pregnancy, unspecified, unspecified trimester: Secondary | ICD-10-CM | POA: Diagnosis not present

## 2020-10-11 DIAGNOSIS — Z1379 Encounter for other screening for genetic and chromosomal anomalies: Secondary | ICD-10-CM

## 2020-10-11 DIAGNOSIS — Z419 Encounter for procedure for purposes other than remedying health state, unspecified: Secondary | ICD-10-CM | POA: Diagnosis not present

## 2020-10-11 DIAGNOSIS — O219 Vomiting of pregnancy, unspecified: Secondary | ICD-10-CM

## 2020-10-11 LAB — OB RESULTS CONSOLE VARICELLA ZOSTER ANTIBODY, IGG: Varicella: NON-IMMUNE/NOT IMMUNE

## 2020-10-11 LAB — POCT URINALYSIS DIPSTICK OB
Glucose, UA: NEGATIVE
POC,PROTEIN,UA: NEGATIVE

## 2020-10-11 MED ORDER — ONDANSETRON 4 MG PO TBDP: 4.0000 mg | ORAL_TABLET | Freq: Four times a day (QID) | ORAL | 0 refills | Status: DC | PRN

## 2020-10-11 NOTE — Progress Notes (Signed)
Routine Prenatal Care Visit  Subjective  Kristina Mccann is a 30 y.o. G2P1001 at [redacted]w[redacted]d being seen today for ongoing prenatal care.  She is currently monitored for the following issues for this high-risk pregnancy and has Acanthosis nigricans; Major depression, chronic (HCC); Gastro-esophageal reflux disease without esophagitis; Anterior knee pain; H/O suicide attempt; Migraine without aura and responsive to treatment; Seasonal allergic rhinitis; HSV-2 seropositive; B12 deficiency; Vitamin D deficiency; Lives in sheltered housing; Supervision of high risk pregnancy, antepartum; Maternal morbid obesity, antepartum, second trimester (HCC); BMI 40.0-44.9, adult (HCC); and Marijuana user on their problem list.  ----------------------------------------------------------------------------------- Patient reports heartburn and nausea.    . Vag. Bleeding: None.   . Leaking Fluid denies.  ----------------------------------------------------------------------------------- The following portions of the patient's history were reviewed and updated as appropriate: allergies, current medications, past family history, past medical history, past social history, past surgical history and problem list. Problem list updated.  Objective  Blood pressure 118/72, height 5\' 2"  (1.575 m), weight 218 lb 3.2 oz (99 kg), last menstrual period 05/29/2020. Pregravid weight 220 lb (99.8 kg) Total Weight Gain -1 lb 12.8 oz (-0.816 kg) Urinalysis: Urine Protein    Urine Glucose    Fetal Status:           General:  Alert, oriented and cooperative. Patient is in no acute distress.  Skin: Skin is warm and dry. No rash noted.   Cardiovascular: Normal heart rate noted  Respiratory: Normal respiratory effort, no problems with respiration noted  Abdomen: Soft, gravid, appropriate for gestational age.       Pelvic:  Cervical exam deferred        Extremities: Normal range of motion.     Mental Status: Normal mood and affect. Normal  behavior. Normal judgment and thought content.   Assessment   30 y.o. G2P1001 at [redacted]w[redacted]d by  03/05/2021, by Last Menstrual Period presenting for routine prenatal visit  Plan   SECOND Problems (from 09/02/20 to present)    Problem Noted Resolved   Marijuana user 09/18/2020 by 11/16/2020, CNM No   Overview Signed 09/18/2020  4:48 PM by 11/16/2020, CNM    + testing ather new OB appointment 09/2020      Maternal morbid obesity, antepartum, second trimester (HCC) 09/08/2020 by 09/10/2020, CNM No   Overview Signed 09/08/2020 11:42 AM by 09/10/2020, CNM    BMI >=40 [ ]  early 1h gtt -  [ ]  screen sleep apnea [ ]  anesthesia consult (early and late if BMI > 45) [x]  u/s for dating [x]  nutritional goals [ ]  folic acid 1mg  [ ]  bASA (>12 weeks) [ ]  consider nutrition consult [ ]  consider maternal EKG 1st trimester [ ]  Growth u/s 28 [ ] , 32 [ ] , 36 weeks [ ]  [ ]  NST/AFI weekly 34+ weeks (34[] ,35[] ,36[] , 37[] , 38[] , 39[] , 40[] ) [ ]  IOL by 41 weeks (scheduled, prn [] )       Supervision of high risk pregnancy, antepartum 09/02/2020 by , CNM No   Overview Addendum 09/18/2020  4:51 PM by , CNM     Nursing Staff Provider  Office Location  [facility] Dating   LMP = 14w . Dating sono CW LMP  Language  English Anatomy    Flu Vaccine   Genetic Screen  NIPS:   Negx3, XX   TDaP vaccine    Hgb A1C or  GTT  A1C: Third trimester :   Rhogam     LAB RESULTS  Feeding Plan  Blood Type     Contraception  Antibody    Circumcision  Rubella    Pediatrician   RPR     Support Person  HBsAg     Prenatal Classes  HIV      Varicella   BTL Consent  GBS  (For PCN allergy, check sensitivities)        VBAC Consent  Pap  NILM    Hgb Electro      CF      SMA         Marijuana + at NOB      Previous Version       Preterm labor symptoms and general obstetric precautions including but not limited to vaginal bleeding, contractions, leaking of fluid and fetal  movement were reviewed in detail with the patient. Please refer to After Visit Summary for other counseling recommendations.  Reviewed her anatomy scan today. She needs a repeat to complete views. Rx for Zofran ODT provided Her NOB labs were not drawn last visit- drawn today with SMA testing.  Return in about 4 weeks (around 11/08/2020) for return OB.  Mirna Mires, CNM  10/11/2020 11:43 AM

## 2020-10-12 LAB — RPR+RH+ABO+RUB AB+AB SCR+CB...
Antibody Screen: NEGATIVE
HIV Screen 4th Generation wRfx: NONREACTIVE
Hematocrit: 34.6 % (ref 34.0–46.6)
Hemoglobin: 11.7 g/dL (ref 11.1–15.9)
Hepatitis B Surface Ag: NEGATIVE
MCH: 29.4 pg (ref 26.6–33.0)
MCHC: 33.8 g/dL (ref 31.5–35.7)
MCV: 87 fL (ref 79–97)
Platelets: 316 10*3/uL (ref 150–450)
RBC: 3.98 x10E6/uL (ref 3.77–5.28)
RDW: 14.2 % (ref 11.7–15.4)
RPR Ser Ql: NONREACTIVE
Rh Factor: POSITIVE
Rubella Antibodies, IGG: <0.9 index — ABNORMAL LOW (ref >0.99)
Varicella zoster IgG: <135 index — ABNORMAL LOW (ref >165)
WBC: 7.5 10*3/uL (ref 3.4–10.8)

## 2020-10-18 ENCOUNTER — Telehealth: Payer: Self-pay

## 2020-10-18 NOTE — Telephone Encounter (Signed)
Pt calling; her work is asking if she can get a 2nd TB skin test.  [phone]  [facility] - it is okay but we draw blood instead - quantaferon TB Gold.

## 2020-10-19 LAB — SMN1 COPY NUMBER ANALYSIS (SMA CARRIER SCREENING)

## 2020-10-19 NOTE — Telephone Encounter (Signed)
Pt aware we do not do the TB skin test; we draw blood.  If her work wants the skin test she can ck c her PCP or HD.

## 2020-10-31 NOTE — Telephone Encounter (Signed)
Called pt to adv of appt changes. She was kind and understanding.

## 2020-11-07 ENCOUNTER — Other Ambulatory Visit: Payer: Self-pay | Admitting: Obstetrics

## 2020-11-07 DIAGNOSIS — Z6841 Body Mass Index (BMI) 40.0 and over, adult: Secondary | ICD-10-CM

## 2020-11-07 DIAGNOSIS — Z362 Encounter for other antenatal screening follow-up: Secondary | ICD-10-CM

## 2020-11-08 ENCOUNTER — Ambulatory Visit: Payer: Medicaid Other

## 2020-11-08 ENCOUNTER — Encounter: Payer: Medicaid Other | Admitting: Obstetrics and Gynecology

## 2020-11-08 DIAGNOSIS — Z6841 Body Mass Index (BMI) 40.0 and over, adult: Secondary | ICD-10-CM

## 2020-11-08 DIAGNOSIS — O0990 Supervision of high risk pregnancy, unspecified, unspecified trimester: Secondary | ICD-10-CM

## 2020-11-11 DIAGNOSIS — Z419 Encounter for procedure for purposes other than remedying health state, unspecified: Secondary | ICD-10-CM | POA: Diagnosis not present

## 2020-11-23 DIAGNOSIS — Z5181 Encounter for therapeutic drug level monitoring: Secondary | ICD-10-CM | POA: Diagnosis not present

## 2020-11-24 ENCOUNTER — Ambulatory Visit
Admission: RE | Admit: 2020-11-24 | Discharge: 2020-11-24 | Disposition: A | Discharge status: Home / Self Care | Payer: Medicaid Other | Source: Ambulatory Visit | Attending: Obstetrics | Admitting: Obstetrics

## 2020-11-24 ENCOUNTER — Other Ambulatory Visit: Payer: Self-pay

## 2020-11-24 DIAGNOSIS — Z6841 Body Mass Index (BMI) 40.0 and over, adult: Secondary | ICD-10-CM | POA: Insufficient documentation

## 2020-11-24 DIAGNOSIS — Z362 Encounter for other antenatal screening follow-up: Secondary | ICD-10-CM

## 2020-11-24 DIAGNOSIS — Z3A25 25 weeks gestation of pregnancy: Secondary | ICD-10-CM | POA: Diagnosis not present

## 2020-11-24 DIAGNOSIS — Z3492 Encounter for supervision of normal pregnancy, unspecified, second trimester: Secondary | ICD-10-CM | POA: Diagnosis not present

## 2020-11-28 ENCOUNTER — Encounter: Payer: Self-pay | Admitting: Obstetrics and Gynecology

## 2020-11-28 ENCOUNTER — Other Ambulatory Visit: Payer: Self-pay

## 2020-11-28 ENCOUNTER — Encounter: Payer: Medicaid Other | Admitting: Obstetrics and Gynecology

## 2020-11-28 ENCOUNTER — Ambulatory Visit (INDEPENDENT_AMBULATORY_CARE_PROVIDER_SITE_OTHER): Payer: Medicaid Other | Admitting: Obstetrics and Gynecology

## 2020-11-28 VITALS — BP 122/74 | Wt 231.0 lb

## 2020-11-28 DIAGNOSIS — Z131 Encounter for screening for diabetes mellitus: Secondary | ICD-10-CM

## 2020-11-28 DIAGNOSIS — Z3A26 26 weeks gestation of pregnancy: Secondary | ICD-10-CM

## 2020-11-28 DIAGNOSIS — O0992 Supervision of high risk pregnancy, unspecified, second trimester: Secondary | ICD-10-CM

## 2020-11-28 DIAGNOSIS — Z113 Encounter for screening for infections with a predominantly sexual mode of transmission: Secondary | ICD-10-CM

## 2020-11-28 DIAGNOSIS — O99212 Obesity complicating pregnancy, second trimester: Secondary | ICD-10-CM

## 2020-11-28 DIAGNOSIS — R768 Other specified abnormal immunological findings in serum: Secondary | ICD-10-CM

## 2020-11-28 NOTE — Progress Notes (Signed)
Routine Prenatal Care Visit  Subjective  Kristina Mccann is a 30 y.o. G2P1001 at [redacted]w[redacted]d being seen today for ongoing prenatal care.  She is currently monitored for the following issues for this high-risk pregnancy and has Acanthosis nigricans; Major depression, chronic (HCC); Gastro-esophageal reflux disease without esophagitis; Anterior knee pain; H/O suicide attempt; Migraine without aura and responsive to treatment; Seasonal allergic rhinitis; HSV-2 seropositive; B12 deficiency; Vitamin D deficiency; Lives in sheltered housing; Supervision of high risk pregnancy, antepartum; Maternal morbid obesity, antepartum, second trimester (HCC); BMI 40.0-44.9, adult (HCC); and Marijuana user on their problem list.  ----------------------------------------------------------------------------------- Patient reports being tired and struggling with sleep.   Contractions: Not present. Vag. Bleeding: None.  Movement: Present. Leaking Fluid denies.  Anatomy u/s now complete.  ----------------------------------------------------------------------------------- The following portions of the patient's history were reviewed and updated as appropriate: allergies, current medications, past family history, past medical history, past social history, past surgical history and problem list. Problem list updated.  Objective  Blood pressure 122/74, weight 231 lb (104.8 kg), last menstrual period 05/29/2020. Pregravid weight 220 lb (99.8 kg) Total Weight Gain 11 lb (4.99 kg) Urinalysis: Urine Protein    Urine Glucose    Fetal Status: Fetal Heart Rate (bpm): 150 Fundal Height: 27 cm Movement: Present     General:  Alert, oriented and cooperative. Patient is in no acute distress.  Skin: Skin is warm and dry. No rash noted.   Cardiovascular: Normal heart rate noted  Respiratory: Normal respiratory effort, no problems with respiration noted  Abdomen: Soft, gravid, appropriate for gestational age. Pain/Pressure: Absent      Pelvic:  Cervical exam deferred        Extremities: Normal range of motion.  Edema: None  Mental Status: Normal mood and affect. Normal behavior. Normal judgment and thought content.   Imaging: US OB Follow Up  Result Date: 11/24/2020 CLINICAL DATA:  Incomplete fetal anatomic survey EXAM: OBSTETRIC 14+ WK ULTRASOUND FOLLOW-UP FINDINGS: Number of Fetuses: 1 Heart Rate:  131 bpm Movement: Yes Presentation: Cephalic Previa: No Placental Location: Anterior Amniotic Fluid (Subjective): Normal Amniotic Fluid (Objective): Vertical pocket 5.1cm FETAL BIOMETRY BPD:  6.2cm 25w 1d HC:    23.0cm 25w 0d AC:    18.7cm 23w 3d FL:    5.0cm 27w 0d Current Mean GA: 25w 2d [place] EDC: 03/07/2021 Assigned GA: 25w 3d Assigned EDC: 03/06/2021 FETAL ANATOMY Lateral Ventricles: Appears normal Thalami/CSP: Appears normal Posterior Fossa: Previously seen Nuchal Region: Previously seen Upper Lip: Appears normal Spine: Previously seen 4 Chamber Heart on Left: Appears normal LVOT: Appears normal RVOT: Appears normal Stomach on Left: Appears normal 3 Vessel Cord: Previously seen Cord Insertion site: Previously seen Kidneys: Appears normal Bladder: Appears normal Extremities: Previously seen Sex: Female Technical Limitations: Maternal body habitus Maternal Findings: Cervix:  3.1 cm IMPRESSION: 1. Single live intrauterine pregnancy as above estimated age 27 weeks and 2 days. 2. Normal appearance of the ventricular outflow tracts, completing the anatomic survey begun previously. No fetal anomalies identified. Electronically Signed   By: Sharlet Salina M.D.   On: 11/24/2020 16:42     Assessment   29 y.o. G2P1001 at [redacted]w[redacted]d by  03/05/2021, by Last Menstrual Period presenting for routine prenatal visit  Plan   SECOND Problems (from 09/02/20 to present)    Problem Noted Resolved   Marijuana user 09/18/2020 by Mirna Mires, CNM No   Overview Signed 09/18/2020  4:48 PM by Mirna Mires, CNM    + testing ather new OB appointment  09/2020      Maternal morbid obesity, antepartum, second trimester (HCC) 09/08/2020 by Zipporah Plants, CNM No   Overview Signed 09/08/2020 11:42 AM by Zipporah Plants, CNM    BMI >=40 [ ]  early 1h gtt -  [ ]  screen sleep apnea [ ]  anesthesia consult (early and late if BMI > 45) [x]  u/s for dating [x]  nutritional goals [ ]  folic acid 1mg  [ ]  bASA (>12 weeks) [ ]  consider nutrition consult [ ]  consider maternal EKG 1st trimester [ ]  Growth u/s 28 [ ] , 32 [ ] , 36 weeks [ ]  [ ]  NST/AFI weekly 34+ weeks (34[] ,35[] ,36[] , 37[] , 38[] , 39[] , 40[] ) [ ]  IOL by 41 weeks (scheduled, prn [] )       Supervision of high risk pregnancy, antepartum 09/02/2020 by , CNM No   Overview Addendum 10/19/2020  9:02 PM by , CNM     Nursing Staff Provider  Office Location  [facility] Dating   LMP = 14w . Dating sono CW LMP  Language  English Anatomy  Normal anatomy, female  Flu Vaccine   Genetic Screen  NIPS:   Negx3, XX  SMA negative  TDaP vaccine    Hgb A1C or  GTT  A1C: Third trimester :   Rhogam     LAB RESULTS   Feeding Plan  Blood Type   A+  Contraception  Antibody  negative  Circumcision  NA Rubella  non immune  Pediatrician   RPR   NR  Support Person  HBsAg   negative  Prenatal Classes  HIV  NR    Varicella Non immune  BTL Consent  GBS  (For PCN allergy, check sensitivities)        VBAC Consent  Pap  NILM    Hgb Electro      CF      SMA         Marijuana + at NOB      Previous Version       Preterm labor symptoms and general obstetric precautions including but not limited to vaginal bleeding, contractions, leaking of fluid and fetal movement were reviewed in detail with the patient. Please refer to After Visit Summary for other counseling recommendations.   Return in about 2 weeks (around 12/12/2020) for 28 week labs and routine prenatal.   , MD, OB/GYN, [facility] 11/28/2020 11:31 AM

## 2020-12-06 ENCOUNTER — Telehealth: Payer: Self-pay

## 2020-12-06 NOTE — Telephone Encounter (Signed)
Pt calling; is 27wks, very nauseas; vomiting; can't keep anything down.  [phone]  Pt states not keeping fluids down started this am; no fever, GFM.  Adv sips of clear liquids x24hrs, then bland food for 24hrs, the work up to reg diet; pt aware to be seen if doesn't keep liquids down for 24hrs.  Pt requests rx for nausea - never p/u rx d/t not having a car.

## 2020-12-11 DIAGNOSIS — Z419 Encounter for procedure for purposes other than remedying health state, unspecified: Secondary | ICD-10-CM | POA: Diagnosis not present

## 2020-12-12 ENCOUNTER — Other Ambulatory Visit: Payer: Medicaid Other

## 2020-12-12 ENCOUNTER — Ambulatory Visit (INDEPENDENT_AMBULATORY_CARE_PROVIDER_SITE_OTHER): Payer: Medicaid Other | Admitting: Obstetrics and Gynecology

## 2020-12-12 ENCOUNTER — Encounter: Payer: Medicaid Other | Admitting: Obstetrics

## 2020-12-12 ENCOUNTER — Encounter: Payer: Self-pay | Admitting: Obstetrics and Gynecology

## 2020-12-12 ENCOUNTER — Other Ambulatory Visit: Payer: Self-pay

## 2020-12-12 VITALS — BP 126/80 | Wt 231.0 lb

## 2020-12-12 DIAGNOSIS — O0992 Supervision of high risk pregnancy, unspecified, second trimester: Secondary | ICD-10-CM | POA: Diagnosis not present

## 2020-12-12 DIAGNOSIS — Z113 Encounter for screening for infections with a predominantly sexual mode of transmission: Secondary | ICD-10-CM | POA: Diagnosis not present

## 2020-12-12 DIAGNOSIS — O34219 Maternal care for unspecified type scar from previous cesarean delivery: Secondary | ICD-10-CM | POA: Insufficient documentation

## 2020-12-12 DIAGNOSIS — O99213 Obesity complicating pregnancy, third trimester: Secondary | ICD-10-CM

## 2020-12-12 DIAGNOSIS — Z131 Encounter for screening for diabetes mellitus: Secondary | ICD-10-CM | POA: Diagnosis not present

## 2020-12-12 DIAGNOSIS — Z3A28 28 weeks gestation of pregnancy: Secondary | ICD-10-CM

## 2020-12-12 DIAGNOSIS — R768 Other specified abnormal immunological findings in serum: Secondary | ICD-10-CM

## 2020-12-12 DIAGNOSIS — O0993 Supervision of high risk pregnancy, unspecified, third trimester: Secondary | ICD-10-CM

## 2020-12-12 NOTE — Progress Notes (Signed)
Routine Prenatal Care Visit  Subjective  Kristina Mccann is a 30 y.o. G2P1001 at [redacted]w[redacted]d being seen today for ongoing prenatal care.  She is currently monitored for the following issues for this high-risk pregnancy and has Acanthosis nigricans; Major depression, chronic (HCC); Gastro-esophageal reflux disease without esophagitis; Anterior knee pain; H/O suicide attempt; Migraine without aura and responsive to treatment; Seasonal allergic rhinitis; HSV-2 seropositive; B12 deficiency; Vitamin D deficiency; Lives in sheltered housing; Supervision of high risk pregnancy, antepartum; Maternal morbid obesity, antepartum, second trimester (HCC); BMI 40.0-44.9, adult (HCC); Marijuana user; and Pregnancy with history of cesarean section, antepartum on their problem list.  ----------------------------------------------------------------------------------- Patient reports no complaints.   Contractions: Not present. Vag. Bleeding: None.  Movement: Present. Leaking Fluid denies.  ----------------------------------------------------------------------------------- The following portions of the patient's history were reviewed and updated as appropriate: allergies, current medications, past family history, past medical history, past social history, past surgical history and problem list. Problem list updated.  Objective  Blood pressure 126/80, weight 231 lb (104.8 kg), last menstrual period 05/29/2020. Pregravid weight 220 lb (99.8 kg) Total Weight Gain 11 lb (4.99 kg) Urinalysis: Urine Protein    Urine Glucose    Fetal Status: Fetal Heart Rate (bpm): 135 Fundal Height: 28 cm Movement: Present     General:  Alert, oriented and cooperative. Patient is in no acute distress.  Skin: Skin is warm and dry. No rash noted.   Cardiovascular: Normal heart rate noted  Respiratory: Normal respiratory effort, no problems with respiration noted  Abdomen: Soft, gravid, appropriate for gestational age. Pain/Pressure: Absent      Pelvic:  Cervical exam deferred        Extremities: Normal range of motion.  Edema: None  Mental Status: Normal mood and affect. Normal behavior. Normal judgment and thought content.   Assessment   30 y.o. G2P1001 at [redacted]w[redacted]d by  03/05/2021, by Last Menstrual Period presenting for routine prenatal visit  Plan   SECOND Problems (from 09/02/20 to present)    Problem Noted Resolved   Pregnancy with history of cesarean section, antepartum 12/12/2020 by Conard Novak, MD No   Marijuana user 09/18/2020 by Mirna Mires, CNM No   Overview Signed 09/18/2020  4:48 PM by Mirna Mires, CNM    + testing ather new OB appointment 09/2020      Maternal morbid obesity, antepartum, second trimester (HCC) 09/08/2020 by Zipporah Plants, CNM No   Overview Signed 09/08/2020 11:42 AM by Zipporah Plants, CNM    BMI >=40 [ ]  early 1h gtt -  [ ]  screen sleep apnea [ ]  anesthesia consult (early and late if BMI > 45) [x]  u/s for dating [x]  nutritional goals [ ]  folic acid 1mg  [ ]  bASA (>12 weeks) [ ]  consider nutrition consult [ ]  consider maternal EKG 1st trimester [ ]  Growth u/s 28 [ ] , 32 [ ] , 36 weeks [ ]  [ ]  NST/AFI weekly 34+ weeks (34[] ,35[] ,36[] , 37[] , 38[] , 39[] , 40[] ) [ ]  IOL by 41 weeks (scheduled, prn [] )       Supervision of high risk pregnancy, antepartum 09/02/2020 by , CNM No   Overview Addendum 10/19/2020  9:02 PM by , CNM     Nursing Staff Provider  Office Location  [facility] Dating   LMP = 14w . Dating sono CW LMP  Language  English Anatomy  Normal anatomy, female  Flu Vaccine   Genetic Screen  NIPS:   Negx3, XX  SMA negative  TDaP vaccine  Hgb A1C or  GTT  A1C: Third trimester :   Rhogam     LAB RESULTS   Feeding Plan  Blood Type   A+  Contraception  Antibody  negative  Circumcision  NA Rubella  non immune  Pediatrician   RPR   NR  Support Person  HBsAg   negative  Prenatal Classes  HIV  NR    Varicella Non immune  BTL Consent  GBS   (For PCN allergy, check sensitivities)        VBAC Consent  Pap  NILM    Hgb Electro      CF      SMA         Marijuana + at NOB      Previous Version       Preterm labor symptoms and general obstetric precautions including but not limited to vaginal bleeding, contractions, leaking of fluid and fetal movement were reviewed in detail with the patient. Please refer to After Visit Summary for other counseling recommendations.   - 28 wk labs today  Return in about 2 weeks (around 12/26/2020) for Routine Prenatal Appointment.   Thomasene Mohair, MD, [facility], [facility] 12/12/2020 10:20 AM

## 2020-12-13 DIAGNOSIS — Z5181 Encounter for therapeutic drug level monitoring: Secondary | ICD-10-CM | POA: Diagnosis not present

## 2020-12-13 LAB — 28 WEEK RH+PANEL
Basophils Absolute: 0 10*3/uL (ref 0.0–0.2)
Basos: 0 %
EOS (ABSOLUTE): 0.1 10*3/uL (ref 0.0–0.4)
Eos: 1 %
Gestational Diabetes Screen: 122 mg/dL (ref 65–139)
HIV Screen 4th Generation wRfx: NONREACTIVE
Hematocrit: 30.8 % — ABNORMAL LOW (ref 34.0–46.6)
Hemoglobin: 10.5 g/dL — ABNORMAL LOW (ref 11.1–15.9)
Immature Grans (Abs): 0 10*3/uL (ref 0.0–0.1)
Immature Granulocytes: 1 %
Lymphocytes Absolute: 1.6 10*3/uL (ref 0.7–3.1)
Lymphs: 21 %
MCH: 29.4 pg (ref 26.6–33.0)
MCHC: 34.1 g/dL (ref 31.5–35.7)
MCV: 86 fL (ref 79–97)
Monocytes Absolute: 0.4 10*3/uL (ref 0.1–0.9)
Monocytes: 5 %
Neutrophils Absolute: 5.5 10*3/uL (ref 1.4–7.0)
Neutrophils: 72 %
Platelets: 308 10*3/uL (ref 150–450)
RBC: 3.57 x10E6/uL — ABNORMAL LOW (ref 3.77–5.28)
RDW: 12.7 % (ref 11.7–15.4)
RPR Ser Ql: NONREACTIVE
WBC: 7.6 10*3/uL (ref 3.4–10.8)

## 2020-12-19 ENCOUNTER — Ambulatory Visit
Admission: RE | Admit: 2020-12-19 | Discharge: 2020-12-19 | Disposition: A | Discharge status: Home / Self Care | Payer: Medicaid Other | Source: Ambulatory Visit | Attending: Obstetrics and Gynecology | Admitting: Obstetrics and Gynecology

## 2020-12-19 ENCOUNTER — Other Ambulatory Visit: Payer: Self-pay

## 2020-12-19 DIAGNOSIS — O99212 Obesity complicating pregnancy, second trimester: Secondary | ICD-10-CM | POA: Diagnosis not present

## 2020-12-19 DIAGNOSIS — Z3493 Encounter for supervision of normal pregnancy, unspecified, third trimester: Secondary | ICD-10-CM | POA: Diagnosis not present

## 2020-12-19 DIAGNOSIS — Z3A29 29 weeks gestation of pregnancy: Secondary | ICD-10-CM | POA: Diagnosis not present

## 2020-12-20 ENCOUNTER — Encounter: Payer: Self-pay | Admitting: Obstetrics and Gynecology

## 2020-12-20 ENCOUNTER — Inpatient Hospital Stay
Admission: EM | Admit: 2020-12-20 | Discharge: 2021-01-10 | DRG: 832 | Disposition: A | Discharge status: Home / Self Care | Payer: Medicaid Other | Attending: Obstetrics | Admitting: Obstetrics

## 2020-12-20 ENCOUNTER — Ambulatory Visit (INDEPENDENT_AMBULATORY_CARE_PROVIDER_SITE_OTHER): Payer: Medicaid Other | Admitting: Obstetrics and Gynecology

## 2020-12-20 ENCOUNTER — Other Ambulatory Visit: Payer: Self-pay

## 2020-12-20 VITALS — BP 144/88 | Wt 226.0 lb

## 2020-12-20 DIAGNOSIS — O1493 Unspecified pre-eclampsia, third trimester: Secondary | ICD-10-CM

## 2020-12-20 DIAGNOSIS — Z3A29 29 weeks gestation of pregnancy: Secondary | ICD-10-CM | POA: Diagnosis not present

## 2020-12-20 DIAGNOSIS — O99891 Other specified diseases and conditions complicating pregnancy: Secondary | ICD-10-CM | POA: Diagnosis present

## 2020-12-20 DIAGNOSIS — O0990 Supervision of high risk pregnancy, unspecified, unspecified trimester: Secondary | ICD-10-CM

## 2020-12-20 DIAGNOSIS — O163 Unspecified maternal hypertension, third trimester: Secondary | ICD-10-CM

## 2020-12-20 DIAGNOSIS — Z20822 Contact with and (suspected) exposure to covid-19: Secondary | ICD-10-CM | POA: Diagnosis present

## 2020-12-20 DIAGNOSIS — O113 Pre-existing hypertension with pre-eclampsia, third trimester: Principal | ICD-10-CM | POA: Diagnosis present

## 2020-12-20 DIAGNOSIS — O288 Other abnormal findings on antenatal screening of mother: Secondary | ICD-10-CM

## 2020-12-20 DIAGNOSIS — A6000 Herpesviral infection of urogenital system, unspecified: Secondary | ICD-10-CM | POA: Diagnosis present

## 2020-12-20 DIAGNOSIS — R768 Other specified abnormal immunological findings in serum: Secondary | ICD-10-CM | POA: Diagnosis present

## 2020-12-20 DIAGNOSIS — Z87891 Personal history of nicotine dependence: Secondary | ICD-10-CM

## 2020-12-20 DIAGNOSIS — O99213 Obesity complicating pregnancy, third trimester: Secondary | ICD-10-CM | POA: Diagnosis present

## 2020-12-20 DIAGNOSIS — O99810 Abnormal glucose complicating pregnancy: Secondary | ICD-10-CM | POA: Diagnosis not present

## 2020-12-20 DIAGNOSIS — O99212 Obesity complicating pregnancy, second trimester: Secondary | ICD-10-CM

## 2020-12-20 DIAGNOSIS — O119 Pre-existing hypertension with pre-eclampsia, unspecified trimester: Secondary | ICD-10-CM

## 2020-12-20 DIAGNOSIS — R000 Tachycardia, unspecified: Secondary | ICD-10-CM | POA: Diagnosis present

## 2020-12-20 DIAGNOSIS — Z3A31 31 weeks gestation of pregnancy: Secondary | ICD-10-CM

## 2020-12-20 DIAGNOSIS — Z3A30 30 weeks gestation of pregnancy: Secondary | ICD-10-CM

## 2020-12-20 DIAGNOSIS — R7689 Other specified abnormal immunological findings in serum: Secondary | ICD-10-CM | POA: Diagnosis present

## 2020-12-20 DIAGNOSIS — Z3A32 32 weeks gestation of pregnancy: Secondary | ICD-10-CM

## 2020-12-20 DIAGNOSIS — O0993 Supervision of high risk pregnancy, unspecified, third trimester: Secondary | ICD-10-CM

## 2020-12-20 DIAGNOSIS — O10913 Unspecified pre-existing hypertension complicating pregnancy, third trimester: Secondary | ICD-10-CM | POA: Diagnosis not present

## 2020-12-20 DIAGNOSIS — F1290 Cannabis use, unspecified, uncomplicated: Secondary | ICD-10-CM | POA: Diagnosis present

## 2020-12-20 DIAGNOSIS — O10013 Pre-existing essential hypertension complicating pregnancy, third trimester: Secondary | ICD-10-CM | POA: Diagnosis present

## 2020-12-20 DIAGNOSIS — O34219 Maternal care for unspecified type scar from previous cesarean delivery: Secondary | ICD-10-CM

## 2020-12-20 DIAGNOSIS — Z880 Allergy status to penicillin: Secondary | ICD-10-CM

## 2020-12-20 DIAGNOSIS — O98313 Other infections with a predominantly sexual mode of transmission complicating pregnancy, third trimester: Secondary | ICD-10-CM | POA: Diagnosis present

## 2020-12-20 DIAGNOSIS — O99323 Drug use complicating pregnancy, third trimester: Secondary | ICD-10-CM | POA: Diagnosis present

## 2020-12-20 LAB — COMPREHENSIVE METABOLIC PANEL
ALT: 8 U/L (ref 0–44)
AST: 20 U/L (ref 15–41)
Albumin: 3 g/dL — ABNORMAL LOW (ref 3.5–5.0)
Alkaline Phosphatase: 74 U/L (ref 38–126)
Anion gap: 9 (ref 5–15)
BUN: 6 mg/dL (ref 6–20)
CO2: 22 mmol/L (ref 22–32)
Calcium: 8.8 mg/dL — ABNORMAL LOW (ref 8.9–10.3)
Chloride: 104 mmol/L (ref 98–111)
Creatinine, Ser: 0.59 mg/dL (ref 0.44–1.00)
GFR, Estimated: >60 mL/min (ref >60)
Glucose, Bld: 81 mg/dL (ref 70–99)
Potassium: 3.7 mmol/L (ref 3.5–5.1)
Sodium: 135 mmol/L (ref 135–145)
Total Bilirubin: 0.2 mg/dL — ABNORMAL LOW (ref 0.3–1.2)
Total Protein: 7.7 g/dL (ref 6.5–8.1)

## 2020-12-20 LAB — PROTEIN / CREATININE RATIO, URINE
Creatinine, Urine: 308 mg/dL
Protein Creatinine Ratio: 0.62 mg/mg{Cre} — ABNORMAL HIGH (ref 0.00–0.15)
Total Protein, Urine: 190 mg/dL

## 2020-12-20 LAB — CBC
HCT: 33.3 % — ABNORMAL LOW (ref 36.0–46.0)
Hemoglobin: 11.1 g/dL — ABNORMAL LOW (ref 12.0–15.0)
MCH: 28.7 pg (ref 26.0–34.0)
MCHC: 33.3 g/dL (ref 30.0–36.0)
MCV: 86 fL (ref 80.0–100.0)
Platelets: 338 10*3/uL (ref 150–400)
RBC: 3.87 MIL/uL (ref 3.87–5.11)
RDW: 13.2 % (ref 11.5–15.5)
WBC: 7.5 10*3/uL (ref 4.0–10.5)
nRBC: 0 % (ref 0.0–0.2)

## 2020-12-20 LAB — URINE DRUG SCREEN, QUALITATIVE (ARMC ONLY)
Amphetamines, Ur Screen: NOT DETECTED
Barbiturates, Ur Screen: NOT DETECTED
Benzodiazepine, Ur Scrn: NOT DETECTED
Cannabinoid 50 Ng, Ur ~~LOC~~: POSITIVE — AB
Cocaine Metabolite,Ur ~~LOC~~: NOT DETECTED
MDMA (Ecstasy)Ur Screen: NOT DETECTED
Methadone Scn, Ur: NOT DETECTED
Opiate, Ur Screen: NOT DETECTED
Phencyclidine (PCP) Ur S: NOT DETECTED
Tricyclic, Ur Screen: NOT DETECTED

## 2020-12-20 MED ORDER — LABETALOL HCL 5 MG/ML IV SOLN: 80.0000 mg | INTRAVENOUS | Status: DC | PRN

## 2020-12-20 MED ORDER — LABETALOL HCL 5 MG/ML IV SOLN
40.0000 mg | INTRAVENOUS | Status: DC | PRN
  Administered 2020-12-20: 40 mg via INTRAVENOUS
  Filled 2020-12-20: qty 8

## 2020-12-20 MED ORDER — HYDRALAZINE HCL 20 MG/ML IJ SOLN
INTRAMUSCULAR | Status: AC
  Filled 2020-12-20: qty 1

## 2020-12-20 MED ORDER — NIFEDIPINE ER OSMOTIC RELEASE 30 MG PO TB24
30.0000 mg | ORAL_TABLET | Freq: Every day | ORAL | Status: DC
  Administered 2020-12-20 – 2020-12-21 (×2): 30 mg via ORAL
  Filled 2020-12-20 (×2): qty 1

## 2020-12-20 MED ORDER — BETAMETHASONE SOD PHOS & ACET 6 (3-3) MG/ML IJ SUSP
12.0000 mg | INTRAMUSCULAR | Status: AC
  Administered 2020-12-20 – 2020-12-21 (×2): 12 mg via INTRAMUSCULAR
  Filled 2020-12-20 (×2): qty 5

## 2020-12-20 MED ORDER — HYDRALAZINE HCL 20 MG/ML IJ SOLN
5.0000 mg | Freq: Once | INTRAMUSCULAR | Status: AC
  Administered 2020-12-20: 5 mg via INTRAVENOUS

## 2020-12-20 MED ORDER — HYDRALAZINE HCL 20 MG/ML IJ SOLN: 10.0000 mg | INTRAMUSCULAR | Status: DC | PRN

## 2020-12-20 MED ORDER — LABETALOL HCL 5 MG/ML IV SOLN
20.0000 mg | INTRAVENOUS | Status: DC | PRN
  Administered 2020-12-20 – 2020-12-22 (×2): 20 mg via INTRAVENOUS
  Filled 2020-12-20 (×2): qty 4

## 2020-12-20 NOTE — H&P (Signed)
Obstetric H&P   Chief Complaint: Elevated BP today in clinic  Prenatal Care Provider: [facility]  History of Present Illness: 30 y.o. G2P1001 [redacted]w[redacted]d by 03/05/2021, by Last Menstrual Period presenting to L&D after routine prenatal visit today at which mild range BP was noted.  The patient has no previously documented history of CHTN, although review of her records show several visits with elevated BP predating this pregnancy, as well as a 13 week BP of 130/90.  She did not have any blood pressure issues with her first pregnancy but this pregnancy is with a different partner.  Denies increased edema, headaches. RUQ/epigastric pain.  Reports +FM, no LOF, no VB, no contractions.  Has growth scan 12/19/2020 which was normal.   Pregravid weight 99.8 kg Total Weight Gain 2.722 kg  SECOND Problems (from 09/02/20 to present)    Problem Noted Resolved   Pregnancy with history of cesarean section, antepartum 12/12/2020 by Will Bonnet, MD No   Marijuana user 09/18/2020 by Imagene Riches, CNM No   Overview Signed 09/18/2020  4:48 PM by Imagene Riches, CNM    + testing ather new OB appointment 09/2020      Maternal morbid obesity, antepartum, second trimester ([place]) 09/08/2020 by Orlie Pollen, CNM No   Overview Signed 09/08/2020 11:42 AM by Orlie Pollen, CNM    BMI >=40 $Remove'[ ]'pHkQDXb$  early 1h gtt -  $'[ ]'D$  screen sleep apnea $RemoveBef'[ ]'vbiMzfBQhR$  anesthesia consult (early and late if BMI > 45) $Rem'[x]'mIzZ$  u/s for dating $RemoveBe'[x]'xyrCSAOsQ$  nutritional goals $RemoveBeforeDEI'[ ]'OBMKoUdhfhiQyPED$  folic acid $RemoveBe'1mg'rVHcnlgjq$'[ ]'$  bASA (>12 weeks) $RemoveBef'[ ]'KsPJkIrJRN$  consider nutrition consult $RemoveBeforeDEI'[ ]'WXhLREktaeDaGfcp$  consider maternal EKG 1st trimester $RemoveBefor'[ ]'HoglgBeJSuwR$  Growth u/s 28 $Remo'[ ]'YHhaJ$ , 32 $Re'[ ]'FIH$ , 36 weeks $Remove'[ ]'LnTkLXP$  $Remove'[ ]'cdHsKCh$  NST/AFI weekly 34+ weeks (34$RemoveBefo'[]'LGyNHAWZfBn$ ,35$Remove'[]'ejrNkCm$ ,36$'[]'u$ , 37$'[]'c$ , 38'[]'$ , 39$Rem'[]'WMkb$ , 40'[]'$ ) $Rem'[ ]'ZVvQ$  IOL by 41 weeks (scheduled, prn $RemoveBeforeD'[]'fuLDDuYNfSNRml$ )       Supervision of high risk pregnancy, antepartum 09/02/2020 by Imagene Riches, CNM No   Overview Addendum 10/19/2020  9:02 PM by Imagene Riches, CNM     Nursing Staff Provider  Office Location  [facility] Dating   LMP = 14w [place].  Dating sono CW LMP  Language  English Anatomy US  Normal anatomy, female  Flu Vaccine   Genetic Screen  NIPS:   Negx3, XX  SMA negative  TDaP vaccine    Hgb A1C or  GTT Third trimester : 88  Rhogam     LAB RESULTS   Feeding Plan  Blood Type   A+  Contraception  Antibody  negative  Circumcision  NA Rubella  non immune  Pediatrician   RPR   NR  Support Person  HBsAg   negative  Prenatal Classes  HIV  NR    Varicella Non immune  BTL Consent  GBS  (For PCN allergy, check sensitivities)        VBAC Consent  Pap  NILM    Hgb Electro      CF      SMA         Marijuana + at NOB      Previous Version       Review of Systems: 10 point review of systems negative unless otherwise noted in HPI  Past Medical History: Patient Active Problem List   Diagnosis Date Noted  . Elevated blood pressure affecting pregnancy in third trimester, antepartum 12/20/2020  . Pregnancy with history of cesarean section, antepartum 12/12/2020  . Marijuana user 09/18/2020    +  testing ather new OB appointment 09/2020   . Maternal morbid obesity, antepartum, second trimester ([place]) 09/08/2020    BMI >=40 $Remove'[ ]'HCSEiyB$  early 1h gtt -  $'[ ]'F$  screen sleep apnea $RemoveBef'[ ]'SXTjqbyukA$  anesthesia consult (early and late if BMI > 45) $Rem'[x]'uswh$  u/s for dating $RemoveBe'[x]'OBjVmsjIt$  nutritional goals $RemoveBeforeDEI'[ ]'BPUlZykJdhUMpvJY$  folic acid $RemoveBe'1mg'dWcuodQlM$'[ ]'$  bASA (>12 weeks) $RemoveBef'[ ]'VKlMqUULIs$  consider nutrition consult $RemoveBeforeDEI'[ ]'DndXbXHksjyLWhGd$  consider maternal EKG 1st trimester $RemoveBefor'[ ]'goZLwCRVZEjz$  Growth u/s 28 $Remo'[ ]'IzXJZ$ , 32 $Re'[ ]'nJg$ , 36 weeks $Remove'[ ]'bogXQvn$  $Remove'[ ]'mSllvFR$  NST/AFI weekly 34+ weeks (34$RemoveBefo'[]'eHeEAElcJpd$ ,35$Remove'[]'AnALJzI$ ,36$'[]'Z$ , 37$'[]'k$ , 38'[]'$ , 39$Rem'[]'PZcz$ , 40'[]'$ ) $Rem'[ ]'ffaw$  IOL by 41 weeks (scheduled, prn $RemoveBeforeD'[]'EXcexlpvCdHedl$ )    . BMI 40.0-44.9, adult ([place]) 09/08/2020  . Supervision of high risk pregnancy, antepartum 09/02/2020     Nursing Staff Provider  Office Location  [facility] Dating   LMP = 14w [place]. Dating sono CW LMP  Language  English Anatomy US  Normal anatomy, female  Flu Vaccine   Genetic Screen  NIPS:   Negx3, XX  SMA negative  TDaP vaccine    Hgb A1C or  GTT  A1C: Third trimester :   Rhogam     LAB RESULTS    Feeding Plan  Blood Type   A+  Contraception  Antibody  negative  Circumcision  NA Rubella  non immune  Pediatrician   RPR   NR  Support Person  HBsAg   negative  Prenatal Classes  HIV  NR    Varicella Non immune  BTL Consent  GBS  (For PCN allergy, check sensitivities)        VBAC Consent  Pap  NILM    Hgb Electro      CF      SMA         Marijuana + at [facility]   . Lives in sheltered housing 04/04/2018  . 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 B12 deficiency 03/22/2016  . Vitamin D deficiency 03/22/2016  . Acanthosis nigricans 04/11/2015  . Major depression, chronic ([place]) 04/11/2015  . Gastro-esophageal reflux disease without esophagitis 04/11/2015  . Anterior knee pain 04/11/2015  . H/O suicide attempt 04/11/2015  . Migraine without aura and responsive to treatment 04/11/2015  . Seasonal allergic rhinitis 04/11/2015    Past Surgical History: Past Surgical History:  Procedure Laterality Date  . BREAST LUMPECTOMY Left 2008  . CESAREAN SECTION      Past Obstetric History: # 1 - Date: 03/23/11, Sex: Female, Weight: 2892 g, GA: None, Delivery: C-Section, Unspecified, Apgar1: None, Apgar5: None, Living: None, Birth Comments: None  # 2 - Date: None, Sex: None, Weight: None, GA: None, Delivery: None, Apgar1: None, Apgar5: None, Living: None, Birth Comments: None   Past Gynecologic History:  Family History: Family History  Problem Relation Age of Onset  . Migraines Mother   . Stroke Father   . Diabetes Father   . Obesity Father   . Hypertension Father   . Asthma Brother   . Asthma Brother   . Asthma Brother   . Diabetes Maternal Grandmother   . Hypertension Maternal Grandmother     Social History: Social History   Socioeconomic History  . Marital status: Single    Spouse name: Not on file  . Number of children: 1  . Years of education: Not on file  . Highest education level: High school graduate  Occupational History  . 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 Occupation: Scientist, water quality    Comment: Research scientist (medical)  Tobacco  Use  . Smoking status: Former Smoker    Years: 3.00    Types: Cigarettes    Start date: 04/10/2012  . Smokeless tobacco: Never Used  . Tobacco comment: 1-2 black and milds a day  Vaping Use  . Vaping Use: Former  Substance and Sexual Activity  . Alcohol use: No    Alcohol/week: 0.0 standard drinks  . Drug use: Yes    Types: Marijuana    Comment: 1-2x day  . Sexual activity: Yes    Partners: Male    Birth control/protection: None  Other Topics Concern  . Not on file  Social History Narrative  . Not on file   Social Determinants of Health   Financial Resource Strain: Not on file  Food Insecurity: Not on file  Transportation Needs: Not on file  Physical Activity: Not on file  Stress: Not on file  Social Connections: Not on file  Intimate Partner Violence: Not on file    Medications: Prior to Admission medications   Medication Sig Start Date End Date Taking? Authorizing Provider  Prenatal Vit-Fe Fumarate-FA (PRENATAL MULTIVITAMIN) TABS tablet Take 1 tablet by mouth daily at 12 noon.   Yes [provider]  metoCLOPramide (REGLAN) 10 MG tablet Take 1 tablet (10 mg total) by mouth every 8 (eight) hours as needed for up to 3 days for nausea. 08/09/20 08/12/20  Rudene Re, MD  ondansetron (ZOFRAN ODT) 4 MG disintegrating tablet Take 1 tablet (4 mg total) by mouth every 6 (six) hours as needed for nausea. Patient not taking: Reported on 12/20/2020 10/11/20   Imagene Riches, CNM    Allergies: No Known Allergies  Physical Exam: Vitals: Blood pressure (!) 162/82, pulse 86, temperature 98.3 F (36.8 C), temperature source Oral, resp. rate 18, height $RemoveBe'5\' 2"'mAhqTOsGI$  (1.575 m), weight 102.5 kg, last menstrual period 05/29/2020.  Urine Dip Protein: N/A see P/C ratio  FHT: 145, moderate, +accels, no decels Toco: none  General: NAD HEENT: normocephalic, anicteric Pulmonary: No  increased work of breathing Cardiovascular: RRR, distal pulses 2+ Abdomen: Gravid, non-tender Genitourinary: deferred Extremities: no edema, erythema, or tenderness Neurologic: Grossly intact Psychiatric: mood appropriate, affect full  Labs: Results for orders placed or performed during the hospital encounter of 12/20/20 (from the past 24 hour(s))  Protein / creatinine ratio, urine     Status: Abnormal   Collection Time: 12/20/20 12:41 PM  Result Value Ref Range   Creatinine, Urine 308 mg/dL   Total Protein, Urine 190 mg/dL   Protein Creatinine Ratio 0.62 (H) 0.00 - 0.15 mg/mg[Cre]  Urine Drug Screen, Qualitative ([facility] only)     Status: Abnormal   Collection Time: 12/20/20 12:41 PM  Result Value Ref Range   Tricyclic, Ur Screen NONE DETECTED NONE DETECTED   Amphetamines, Ur Screen NONE DETECTED NONE DETECTED   MDMA (Ecstasy)Ur Screen NONE DETECTED NONE DETECTED   Cocaine Metabolite,Ur Lee NONE DETECTED NONE DETECTED   Opiate, Ur Screen NONE DETECTED NONE DETECTED   Phencyclidine (PCP) Ur S NONE DETECTED NONE DETECTED   Cannabinoid 50 Ng, Ur [place] POSITIVE (A) NONE DETECTED   Barbiturates, Ur Screen NONE DETECTED NONE DETECTED   Benzodiazepine, Ur Scrn NONE DETECTED NONE DETECTED   Methadone Scn, Ur NONE DETECTED NONE DETECTED  CBC on admission     Status: Abnormal   Collection Time: 12/20/20 12:54 PM  Result Value Ref Range   WBC 7.5 4.0 - 10.5 K/uL   RBC 3.87 3.87 - 5.11 MIL/uL   Hemoglobin 11.1 (L) 12.0 -  15.0 g/dL   HCT 33.3 (L) 36.0 - 46.0 %   MCV 86.0 80.0 - 100.0 fL   MCH 28.7 26.0 - 34.0 pg   MCHC 33.3 30.0 - 36.0 g/dL   RDW 13.2 11.5 - 15.5 %   Platelets 338 150 - 400 K/uL   nRBC 0.0 0.0 - 0.2 %  Comprehensive metabolic panel     Status: Abnormal   Collection Time: 12/20/20 12:54 PM  Result Value Ref Range   Sodium 135 135 - 145 mmol/L   Potassium 3.7 3.5 - 5.1 mmol/L   Chloride 104 98 - 111 mmol/L   CO2 22 22 - 32 mmol/L   Glucose, Bld 81 70 - 99 mg/dL   BUN 6 6  - 20 mg/dL   Creatinine, Ser 0.59 0.44 - 1.00 mg/dL   Calcium 8.8 (L) 8.9 - 10.3 mg/dL   Total Protein 7.7 6.5 - 8.1 g/dL   Albumin 3.0 (L) 3.5 - 5.0 g/dL   AST 20 15 - 41 U/L   ALT 8 0 - 44 U/L   Alkaline Phosphatase 74 38 - 126 U/L   Total Bilirubin 0.2 (L) 0.3 - 1.2 mg/dL   GFR, Estimated >60 >60 mL/min   Anion gap 9 5 - 15    Assessment: 30 y.o. G2P1001 [redacted]w[redacted]d by 03/05/2021, by Last Menstrual Period with superimposed preeclampsia  Plan: 1) Superimposed preeclampsia - patient with documented BP elevations preceeding this pregnancy including a 130/100 at time of her nexplanon removal 02/25/2019 as well as 149/116 at a 08/25/2019 ER visit.  So while no formal diagnosis of CHTN high suspicion.   - laboratory evaluation normal other than elevated P/C ratio - received IV labetalol once will start on nifedipine XR $RemoveBefor'30mg'GemUToFGXZcg$  - BMZ x 2  - Normal growth scan 12/19/20 1315g c.w 36%ile and AFI of 11.1cm - If BP improve and repeat labs tomorrow remain stable twice weekly APT with delivery at 37 weeks unless indications for sooner delivery emerge  2) Fetus - category I tracing  3) PNL - Blood type A/Positive/-- (03/01 1145) / Anti-bodyscreen Negative (03/01 1145) / Rubella <0.90 (03/01 1145) / Varicella Immune / RPR Non Reactive (05/02 1017) / HBsAg Negative (03/01 1145) / HIV Non Reactive (05/02 1017) / 1-hr OGTT 122 / GBS unknown  4) Immunization History -  Immunization History  Administered Date(s) Administered  . DTaP 03/18/1992, 05/23/1992, 07/25/1992, 04/26/1993, 10/05/1996  . Hepatitis A 08/30/2008  . Hepatitis A, Adult 08/30/2008  . Hepatitis B 06-02-92, 03/18/1992, 10/25/1992  . Hepatitis B, adult 12/04/91, 03/18/1992, 10/25/1992  . HiB (PRP-OMP) 03/18/1992, 05/23/1992, 04/26/1993, 10/05/1996  . IPV 03/18/1992, 05/23/1992, 07/25/1992, 04/26/1993  . Influenza,inj,Quad PF,6+ Mos 04/11/2015  . MMR 04/26/1993, 10/05/1996  . Td 08/30/2008  . Tdap 08/13/2010    5) Disposition - pending  BP trend and repeat labs/24-hr urine, completion of BMZ course  Malachy Mood, MD, [place] 12/20/2020, 2:52 PM

## 2020-12-20 NOTE — Progress Notes (Signed)
Routine Prenatal Care Visit  Subjective  Kristina Mccann is a 30 y.o. G2P1001 at [redacted]w[redacted]d being seen today for ongoing prenatal care.  She is currently monitored for the following issues for this high-risk pregnancy and has Acanthosis nigricans; Major depression, chronic (HCC); Gastro-esophageal reflux disease without esophagitis; Anterior knee pain; H/O suicide attempt; Migraine without aura and responsive to treatment; Seasonal allergic rhinitis; HSV-2 seropositive; B12 deficiency; Vitamin D deficiency; Lives in sheltered housing; Supervision of high risk pregnancy, antepartum; Maternal morbid obesity, antepartum, second trimester (HCC); BMI 40.0-44.9, adult (HCC); Marijuana user; and Pregnancy with history of cesarean section, antepartum on their problem list.  ----------------------------------------------------------------------------------- Patient reports no complaints.   Contractions: Not present. Vag. Bleeding: None.  Movement: Present. Leaking Fluid denies.  Denies HA, visual changes, RUQ/epigastric pain Growth u/s yesterday normal.  See report below.  ----------------------------------------------------------------------------------- The following portions of the patient's history were reviewed and updated as appropriate: allergies, current medications, past family history, past medical history, past social history, past surgical history and problem list. Problem list updated.  Objective  Blood pressure (!) 144/88, weight 226 lb (102.5 kg), last menstrual period 05/29/2020. BP recheck: 142/86 Pregravid weight 220 lb (99.8 kg) Total Weight Gain 6 lb (2.722 kg) Urinalysis: Urine Protein    Urine Glucose    Fetal Status: Fetal Heart Rate (bpm): 139 ([place] yesterday)   Movement: Present     General:  Alert, oriented and cooperative. Patient is in no acute distress.  Skin: Skin is warm and dry. No rash noted.   Cardiovascular: Normal heart rate noted  Respiratory: Normal respiratory  effort, no problems with respiration noted  Abdomen: Soft, gravid, appropriate for gestational age. Pain/Pressure: Absent     Pelvic:  Cervical exam deferred        Extremities: Normal range of motion.  Edema: None  Mental Status: Normal mood and affect. Normal behavior. Normal judgment and thought content.   Imaging Results US OB Follow Up  Result Date: 12/20/2020 CLINICAL DATA:  Assess growth and amniotic fluid index. Twenty-nine weeks 0 days based on EDC of 03/06/2021. EXAM: OBSTETRIC 14+ WK ULTRASOUND FOLLOW-UP FINDINGS: Number of Fetuses: 1 Heart Rate:  139 bpm Movement: Present Presentation: Cephalic Previa: None Placental Location: Anterior Amniotic Fluid (Subjective): Normal Amniotic Fluid (Objective): 11.1 cm (At 29 weeks, normal range is 6.5 -20.15 centimeters) Vertical pocket 4.1cm (At 29 weeks, normal range is 6.5 -20.15 centimeters) FETAL BIOMETRY BPD:  7.23cm 29w 0d HC:    27.12cm 29w 4d AC:    24.49cm 28w 5d FL:    5.53cm 29w 1d Current Mean GA: 29w 1d [place] EDC: 03/05/2021 Assigned GA: 29w 0d Assigned EDC: 03/06/2021 Estimated Fetal Weight:  1315g 36 percentile Estimated fetal weight on previous ultrasound: 763.4 grams, 24.1 percentile FETAL ANATOMY Lateral Ventricles: Appears normal Thalami/CSP: Appears normal Posterior Fossa: Previously seen Nuchal Region: Previously seen Upper Lip: Previously seen Spine: Previously seen 4 Chamber Heart on Left: Appears normal LVOT: Previously seen RVOT: Previously seen Stomach on Left: Appears normal 3 Vessel Cord: Previously seen Cord Insertion site: Previously seen Kidneys: Appears normal Bladder: Appears normal Extremities: Previously seen Sex: Female Technical Limitations: Maternal body habitus Maternal Findings: Cervix:  3.9 centimeters on transabdominal evaluation IMPRESSION: 1. Single living intrauterine fetus in cephalic presentation. 2. Size and dates correlate well. 3. No fetal anomalies identified. Electronically Signed   By: Norva Pavlov M.D.    On: 12/20/2020 09:12     Assessment   29 y.o. G2P1001 at [redacted]w[redacted]d by  03/05/2021, by  Last Menstrual Period presenting for routine prenatal visit  Plan   SECOND Problems (from 09/02/20 to present)    Problem Noted Resolved   Pregnancy with history of cesarean section, antepartum 12/12/2020 by Conard Novak, MD No   Marijuana user 09/18/2020 by Mirna Mires, CNM No   Overview Signed 09/18/2020  4:48 PM by Mirna Mires, CNM    + testing ather new OB appointment 09/2020      Maternal morbid obesity, antepartum, second trimester (HCC) 09/08/2020 by Zipporah Plants, CNM No   Overview Signed 09/08/2020 11:42 AM by Zipporah Plants, CNM    BMI >=40 [ ]  early 1h gtt -  [ ]  screen sleep apnea [ ]  anesthesia consult (early and late if BMI > 45) [x]  u/s for dating [x]  nutritional goals [ ]  folic acid 1mg  [ ]  bASA (>12 weeks) [ ]  consider nutrition consult [ ]  consider maternal EKG 1st trimester [ ]  Growth u/s 28 [ ] , 32 [ ] , 36 weeks [ ]  [ ]  NST/AFI weekly 34+ weeks (34[] ,35[] ,36[] , 37[] , 38[] , 39[] , 40[] ) [ ]  IOL by 41 weeks (scheduled, prn [] )       Supervision of high risk pregnancy, antepartum 09/02/2020 by , CNM No   Overview Addendum 10/19/2020  9:02 PM by , CNM     Nursing Staff Provider  Office Location  [facility] Dating   LMP = 14w . Dating sono CW LMP  Language  English Anatomy  Normal anatomy, female  Flu Vaccine   Genetic Screen  NIPS:   Negx3, XX  SMA negative  TDaP vaccine    Hgb A1C or  GTT  A1C: Third trimester :   Rhogam     LAB RESULTS   Feeding Plan  Blood Type   A+  Contraception  Antibody  negative  Circumcision  NA Rubella  non immune  Pediatrician   RPR   NR  Support Person  HBsAg   negative  Prenatal Classes  HIV  NR    Varicella Non immune  BTL Consent  GBS  (For PCN allergy, check sensitivities)        VBAC Consent  Pap  NILM    Hgb Electro      CF      SMA         Marijuana + at NOB      Previous Version        Preterm labor symptoms and general obstetric precautions including but not limited to vaginal bleeding, contractions, leaking of fluid and fetal movement were reviewed in detail with the patient. Please refer to After Visit Summary for other counseling recommendations.   To L&D for blood pressure follow up and preeclampsia workup. Explained the elevated blood pressure and what it might mean for her pregnancy and the importance of going to L&D for further, timely  Workup. She voiced understanding and promised she would go to L&D.   Return in about 2 weeks (around 01/03/2021) for Routine Prenatal Appointment.   , MD, OB/GYN, [facility] 12/20/2020 11:41 AM

## 2020-12-20 NOTE — OB Triage Note (Signed)
Pt presented to L/D triage from the office for PIH eval. Reported elevated Bps in office. Upon arrival, pt reports no headache, epigastric pain, or atypical edema.+2 reflexes, no clonus. Pt reports adequate urine output, positive fetal movement, and no bleeding or LOF.  Pt reports seeing occasional "halos". Reports pelvic pressure but no current pain. Initial FHT 145.Monitors applied and assessing. Initial BP 156/91- cycling q15 min. Other vitals WNL.

## 2020-12-21 ENCOUNTER — Other Ambulatory Visit: Payer: Self-pay | Admitting: Obstetrics and Gynecology

## 2020-12-21 DIAGNOSIS — O34219 Maternal care for unspecified type scar from previous cesarean delivery: Secondary | ICD-10-CM

## 2020-12-21 DIAGNOSIS — O113 Pre-existing hypertension with pre-eclampsia, third trimester: Secondary | ICD-10-CM | POA: Diagnosis not present

## 2020-12-21 DIAGNOSIS — Z3A29 29 weeks gestation of pregnancy: Secondary | ICD-10-CM | POA: Diagnosis not present

## 2020-12-21 DIAGNOSIS — O10913 Unspecified pre-existing hypertension complicating pregnancy, third trimester: Secondary | ICD-10-CM | POA: Diagnosis not present

## 2020-12-21 DIAGNOSIS — O99213 Obesity complicating pregnancy, third trimester: Secondary | ICD-10-CM

## 2020-12-21 DIAGNOSIS — O0990 Supervision of high risk pregnancy, unspecified, unspecified trimester: Secondary | ICD-10-CM

## 2020-12-21 LAB — COMPREHENSIVE METABOLIC PANEL
ALT: 8 U/L (ref 0–44)
AST: 17 U/L (ref 15–41)
Albumin: 2.9 g/dL — ABNORMAL LOW (ref 3.5–5.0)
Alkaline Phosphatase: 79 U/L (ref 38–126)
Anion gap: 9 (ref 5–15)
BUN: 5 mg/dL — ABNORMAL LOW (ref 6–20)
CO2: 20 mmol/L — ABNORMAL LOW (ref 22–32)
Calcium: 8.8 mg/dL — ABNORMAL LOW (ref 8.9–10.3)
Chloride: 104 mmol/L (ref 98–111)
Creatinine, Ser: 0.5 mg/dL (ref 0.44–1.00)
GFR, Estimated: >60 mL/min (ref >60)
Glucose, Bld: 117 mg/dL — ABNORMAL HIGH (ref 70–99)
Potassium: 4 mmol/L (ref 3.5–5.1)
Sodium: 133 mmol/L — ABNORMAL LOW (ref 135–145)
Total Bilirubin: 0.4 mg/dL (ref 0.3–1.2)
Total Protein: 7.6 g/dL (ref 6.5–8.1)

## 2020-12-21 LAB — CBC
HCT: 31.9 % — ABNORMAL LOW (ref 36.0–46.0)
Hemoglobin: 10.4 g/dL — ABNORMAL LOW (ref 12.0–15.0)
MCH: 27.9 pg (ref 26.0–34.0)
MCHC: 32.6 g/dL (ref 30.0–36.0)
MCV: 85.5 fL (ref 80.0–100.0)
Platelets: 326 10*3/uL (ref 150–400)
RBC: 3.73 MIL/uL — ABNORMAL LOW (ref 3.87–5.11)
RDW: 13.2 % (ref 11.5–15.5)
WBC: 10.4 10*3/uL (ref 4.0–10.5)
nRBC: 0 % (ref 0.0–0.2)

## 2020-12-21 MED ORDER — CALCIUM CARBONATE ANTACID 500 MG PO CHEW
CHEWABLE_TABLET | ORAL | Status: AC
  Administered 2020-12-22: 400 mg via ORAL
  Filled 2020-12-21: qty 2

## 2020-12-21 MED ORDER — CALCIUM CARBONATE ANTACID 500 MG PO CHEW
2.0000 | CHEWABLE_TABLET | Freq: Three times a day (TID) | ORAL | Status: DC | PRN
  Administered 2020-12-21 – 2021-01-04 (×10): 400 mg via ORAL
  Filled 2020-12-21 (×11): qty 2

## 2020-12-21 MED ORDER — NIFEDIPINE ER OSMOTIC RELEASE 30 MG PO TB24
60.0000 mg | ORAL_TABLET | Freq: Every day | ORAL | Status: DC
  Administered 2020-12-22 – 2020-12-23 (×2): 60 mg via ORAL
  Filled 2020-12-21 (×2): qty 2

## 2020-12-21 NOTE — Progress Notes (Signed)
Daily Antepartum Note  Admission Date: 12/20/2020 Current Date: 12/21/2020 10:39 AM  Kristina Mccann is a 30 y.o. G2P1001 @ [redacted]w[redacted]d by LMP consistent with 14 week [place], placed in observation for elevated BP in third trimester of pregnancy. Today, the patient denies S&S of severe preE including headache with vision changes, RUQ pain, SOB, or edema. Patient reports +FM, denies LOF, VB, or ctx at this time.   Pregnancy complicated by:  Patient Active Problem List   Diagnosis Date Noted  . Elevated blood pressure affecting pregnancy in third trimester, antepartum 12/20/2020  . Pregnancy with history of cesarean section, antepartum 12/12/2020  . Marijuana user 09/18/2020  . Maternal morbid obesity in third trimester, antepartum (HCC) 09/08/2020  . BMI 40.0-44.9, adult (HCC) 09/08/2020  . Supervision of high risk pregnancy, antepartum 09/02/2020  . 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 B12 deficiency 03/22/2016  . Vitamin D deficiency 03/22/2016  . Acanthosis nigricans 04/11/2015  . Major depression, chronic (HCC) 04/11/2015  . Gastro-esophageal reflux disease without esophagitis 04/11/2015  . Anterior knee pain 04/11/2015  . H/O suicide attempt 04/11/2015  . Migraine without aura and responsive to treatment 04/11/2015  . Seasonal allergic rhinitis 04/11/2015    Overnight/24hr events:  No events overnight  Subjective:  Patient is resting comfortably in bed. Denies concerns this morning.  Objective:   Vitals:   12/21/20 0844 12/21/20 0953  BP: (!) 149/88 138/77  Pulse: 95 (!) 106  Resp:    Temp:     Temp:  [97.9 F (36.6 C)-98.4 F (36.9 C)] 98.4 F (36.9 C) (05/11 0814) Pulse Rate:  [71-106] 106 (05/11 0953) Resp:  [14-18] 16 (05/11 0814) BP: (103-170)/(48-96) 138/77 (05/11 0953) Weight:  [102.5 kg] 102.5 kg (05/10 1232) Temp (24hrs), Avg:98.1 F (36.7 C), Min:97.9 F (36.6 C), Max:98.4 F (36.9 C)   Intake/Output Summary (Last 24 hours) at 12/21/2020 1039 Last data filed at  12/21/2020 0545 Gross per 24 hour  Intake --  Output 1035 ml  Net -1035 ml     Current Vital Signs 24h Vital Sign Ranges  T 98.4 F (36.9 C) Temp  Avg: 98.1 F (36.7 C)  Min: 97.9 F (36.6 C)  Max: 98.4 F (36.9 C)  BP 138/77 BP  Min: 103/48  Max: 170/83  HR (!) 106 Pulse  Avg: 87.2  Min: 71  Max: 106  RR 16 Resp  Avg: 15.7  Min: 14  Max: 18  SaO2     No data recorded       24 Hour I/O Current Shift I/O  Time Ins Outs 05/10 0701 - 05/11 0700 In: -  Out: 1035 [Urine:1035] No intake/output data recorded.   Patient Vitals for the past 24 hrs:  BP Temp Temp src Pulse Resp Height Weight  12/21/20 0953 138/77 -- -- (!) 106 -- -- --  12/21/20 0844 (!) 149/88 -- -- 95 -- -- --  12/21/20 0814 (!) 154/94 98.4 F (36.9 C) Oral (!) 102 16 -- --  12/21/20 0744 (!) 141/83 -- -- 100 -- -- --  12/21/20 0644 (!) 125/55 -- -- 99 -- -- --  12/21/20 0555 122/66 97.9 F (36.6 C) Oral 91 14 -- --  12/21/20 0544 (!) 103/48 -- -- 82 -- -- --  12/21/20 0443 138/72 -- -- 93 -- -- --  12/21/20 0343 125/62 -- -- 91 -- -- --  12/21/20 0245 (!) 110/51 -- -- 85 -- -- --  12/21/20 0143 118/66 -- -- 96 -- -- --  12/21/20  0044 (!) 111/54 98.1 F (36.7 C) Oral 89 16 -- --  12/20/20 2344 (!) 145/71 -- -- 88 -- -- --  12/20/20 2244 (!) 152/72 -- -- 94 -- -- --  12/20/20 2206 -- 98.1 F (36.7 C) Oral -- -- -- --  12/20/20 2144 133/75 -- -- 99 -- -- --  12/20/20 2114 126/61 -- -- 89 -- -- --  12/20/20 2050 128/65 -- -- 85 -- -- --  12/20/20 2014 (!) 153/82 -- -- 87 -- -- --  12/20/20 1944 (!) 150/84 -- -- 88 -- -- --  12/20/20 1914 (!) 152/84 -- -- 97 -- -- --  12/20/20 1910 -- 98 F (36.7 C) Oral -- 14 -- --  12/20/20 1854 (!) 159/95 -- -- 95 -- -- --  12/20/20 1804 (!) 170/83 -- -- 90 -- -- --  12/20/20 1734 (!) 153/68 -- -- 85 -- -- --  12/20/20 1733 (!) 153/68 -- -- 85 -- -- --  12/20/20 1724 (!) 153/85 -- -- 89 -- -- --  12/20/20 1714 (!) 147/84 -- -- 90 -- -- --  12/20/20 1704 (!) 150/76 --  -- 87 -- -- --  12/20/20 1654 (!) 146/71 -- -- 82 -- -- --  12/20/20 1644 (!) 146/68 -- -- 81 -- -- --  12/20/20 1634 140/74 -- -- 81 -- -- --  12/20/20 1624 (!) 147/70 -- -- 84 -- -- --  12/20/20 1613 (!) 148/72 -- -- 86 -- -- --  12/20/20 1604 -- -- -- 89 -- -- --  12/20/20 1553 (!) 154/77 -- -- 83 -- -- --  12/20/20 1545 134/74 -- -- 83 -- -- --  12/20/20 1544 (!) 148/71 -- -- 79 -- -- --  12/20/20 1529 (!) 166/96 -- -- 81 -- -- --  12/20/20 1514 (!) 160/93 98.2 F (36.8 C) Oral 82 16 -- --  12/20/20 1459 (!) 170/89 -- -- 91 -- -- --  12/20/20 1443 (!) 162/82 -- -- 86 -- -- --  12/20/20 1429 (!) 154/79 -- -- 84 -- -- --  12/20/20 1418 (!) 150/86 -- -- 86 -- -- --  12/20/20 1409 (!) 159/80 -- -- 80 -- -- --  12/20/20 1358 (!) 165/80 -- -- 80 -- -- --  12/20/20 1348 (!) 155/86 -- -- 82 -- -- --  12/20/20 1335 (!) 156/83 -- -- 79 -- -- --  12/20/20 1325 (!) 164/79 -- -- 81 -- -- --  12/20/20 1310 (!) 159/87 -- -- 87 -- -- --  12/20/20 1255 (!) 170/87 -- -- 71 -- -- --  12/20/20 1240 (!) 164/95 -- -- 78 -- -- --  12/20/20 1232 (!) 156/91 98.3 F (36.8 C) Oral 80 18 5\' 2"  (1.575 m) 102.5 kg  12/20/20 1225 (!) 156/91 -- -- 80 -- -- --    Physical exam: General: Well nourished, well developed female in no acute distress. Abdomen: gravid, non-tender Cardiovascular: S1, S2 normal, no murmur, rub or gallop, regular rate and rhythm Respiratory: CTAB Extremities: no cyanosis or edema Skin: Warm and dry.   Medications: Current Facility-Administered Medications  Medication Dose Route Frequency Provider Last Rate Last Admin  . betamethasone acetate-betamethasone sodium phosphate (CELESTONE) injection 12 mg  12 mg Intramuscular Q24 Hr x 2 02/19/21, MD   12 mg at 12/20/20 1506  . labetalol (NORMODYNE) injection 20 mg  20 mg Intravenous PRN 02/19/21, MD   20 mg at 12/20/20 1307   And  . labetalol (NORMODYNE) injection 40  mg  40 mg Intravenous PRN Vena Austria, MD    40 mg at 12/20/20 1534   And  . labetalol (NORMODYNE) injection 80 mg  80 mg Intravenous PRN Vena Austria, MD       And  . hydrALAZINE (APRESOLINE) injection 10 mg  10 mg Intravenous PRN Vena Austria, MD      . NIFEdipine (PROCARDIA-XL/NIFEDICAL-XL) 24 hr tablet 30 mg  30 mg Oral Daily Vena Austria, MD   30 mg at 12/21/20 0954    Labs:  Recent Labs  Lab 12/20/20 1254 12/21/20 0700  WBC 7.5 10.4  HGB 11.1* 10.4*  HCT 33.3* 31.9*  PLT 338 326    Recent Labs  Lab 12/20/20 1254 12/21/20 0700  NA 135 133*  K 3.7 4.0  CL 104 104  CO2 22 20*  BUN 6 5*  CREATININE 0.59 0.50  CALCIUM 8.8* 8.8*  PROT 7.7 7.6  BILITOT 0.2* 0.4  ALKPHOS 74 79  ALT 8 8  AST 20 17  GLUCOSE 81 117*    Assessment & Plan:   30 yo, G2P1001, suspected hx of cHTN now with superimposed pre-eclampsia  1) Superimposed preeclampsia - overnight BP improved following initial dose of procardia - with elevations noted in BP prior to second dose this AM - will continue to monitor response, titrate procardia as needed  -f/u preE labs this am remain wnl - 24h urine collection is pending [lab had to be restarted at  1000 today, 5/11]  -BMZ - second dose today  -Plan for close interval antenatal surveillance reviewed with patient 2) Fetal well-being - reactive NST - baseline 140 bmp, 10x10 accels noted, -decels - appropriate for early gestational age  15) Disposition - Continue current care   Zipporah Plants, CNM [facility] 12/21/2020 10:50 AM

## 2020-12-22 DIAGNOSIS — O34219 Maternal care for unspecified type scar from previous cesarean delivery: Secondary | ICD-10-CM | POA: Diagnosis not present

## 2020-12-22 DIAGNOSIS — Z3A31 31 weeks gestation of pregnancy: Secondary | ICD-10-CM | POA: Diagnosis not present

## 2020-12-22 DIAGNOSIS — O10913 Unspecified pre-existing hypertension complicating pregnancy, third trimester: Secondary | ICD-10-CM | POA: Diagnosis not present

## 2020-12-22 DIAGNOSIS — O119 Pre-existing hypertension with pre-eclampsia, unspecified trimester: Secondary | ICD-10-CM

## 2020-12-22 DIAGNOSIS — Z87891 Personal history of nicotine dependence: Secondary | ICD-10-CM | POA: Diagnosis not present

## 2020-12-22 DIAGNOSIS — Z880 Allergy status to penicillin: Secondary | ICD-10-CM | POA: Diagnosis not present

## 2020-12-22 DIAGNOSIS — O113 Pre-existing hypertension with pre-eclampsia, third trimester: Principal | ICD-10-CM

## 2020-12-22 DIAGNOSIS — Z3A32 32 weeks gestation of pregnancy: Secondary | ICD-10-CM | POA: Diagnosis not present

## 2020-12-22 DIAGNOSIS — O98313 Other infections with a predominantly sexual mode of transmission complicating pregnancy, third trimester: Secondary | ICD-10-CM | POA: Diagnosis not present

## 2020-12-22 DIAGNOSIS — O99213 Obesity complicating pregnancy, third trimester: Secondary | ICD-10-CM | POA: Diagnosis not present

## 2020-12-22 DIAGNOSIS — Z3A29 29 weeks gestation of pregnancy: Secondary | ICD-10-CM | POA: Diagnosis not present

## 2020-12-22 DIAGNOSIS — I959 Hypotension, unspecified: Secondary | ICD-10-CM | POA: Diagnosis not present

## 2020-12-22 DIAGNOSIS — O10013 Pre-existing essential hypertension complicating pregnancy, third trimester: Secondary | ICD-10-CM | POA: Diagnosis not present

## 2020-12-22 DIAGNOSIS — O99891 Other specified diseases and conditions complicating pregnancy: Secondary | ICD-10-CM | POA: Diagnosis not present

## 2020-12-22 DIAGNOSIS — O99810 Abnormal glucose complicating pregnancy: Secondary | ICD-10-CM | POA: Diagnosis not present

## 2020-12-22 DIAGNOSIS — F1290 Cannabis use, unspecified, uncomplicated: Secondary | ICD-10-CM | POA: Diagnosis not present

## 2020-12-22 DIAGNOSIS — R030 Elevated blood-pressure reading, without diagnosis of hypertension: Secondary | ICD-10-CM | POA: Diagnosis not present

## 2020-12-22 DIAGNOSIS — Z20822 Contact with and (suspected) exposure to covid-19: Secondary | ICD-10-CM | POA: Diagnosis not present

## 2020-12-22 DIAGNOSIS — A6000 Herpesviral infection of urogenital system, unspecified: Secondary | ICD-10-CM | POA: Diagnosis not present

## 2020-12-22 DIAGNOSIS — O99323 Drug use complicating pregnancy, third trimester: Secondary | ICD-10-CM | POA: Diagnosis not present

## 2020-12-22 DIAGNOSIS — R000 Tachycardia, unspecified: Secondary | ICD-10-CM | POA: Diagnosis not present

## 2020-12-22 DIAGNOSIS — Z3A30 30 weeks gestation of pregnancy: Secondary | ICD-10-CM | POA: Diagnosis not present

## 2020-12-22 LAB — RESP PANEL BY RT-PCR (FLU A&B, COVID) ARPGX2
Influenza A by PCR: NEGATIVE
Influenza B by PCR: NEGATIVE
SARS Coronavirus 2 by RT PCR: NEGATIVE

## 2020-12-22 MED ORDER — CALCIUM CARBONATE ANTACID 500 MG PO CHEW
400.0000 mg | CHEWABLE_TABLET | Freq: Two times a day (BID) | ORAL | Status: DC
  Administered 2020-12-22 – 2021-01-10 (×36): 400 mg via ORAL
  Filled 2020-12-22 (×36): qty 2

## 2020-12-22 NOTE — Progress Notes (Signed)
Discussed plan of care with patient. I spoke with MFM, Dr. Judeth Cornfield, about this patient.  We agreed that she should have better BP control prior to consideration for discharge.   Will monitor overnight and reassess tomorrow.  Patient agreeable to this plan.  No complaints at this time.   Thomasene Mohair, MD, [facility], [facility] 12/22/2020 4:57 PM

## 2020-12-22 NOTE — Progress Notes (Signed)
Daily Antepartum Note  Admission Date: 12/20/2020 Current Date: 12/22/2020 11:51 AM  Kristina Mccann is a 30 y.o. G2P1001 @ [redacted]w[redacted]d by LMP consistent with 14 week [place], placed in observation for elevated BP in third trimester of pregnancy. The patient denies S&S of severe preE including headache with vision changes, RUQ pain, SOB, or edema. Patient reports +FM, denies LOF, VB, or ctx at this time.   Pregnancy complicated by:   Patient Active Problem List   Diagnosis Date Noted  . Elevated blood pressure affecting pregnancy in third trimester, antepartum 12/20/2020  . Pregnancy with history of cesarean section, antepartum 12/12/2020  . Marijuana user 09/18/2020  . Maternal morbid obesity in third trimester, antepartum (HCC) 09/08/2020  . BMI 40.0-44.9, adult (HCC) 09/08/2020  . Supervision of high risk pregnancy, antepartum 09/02/2020  . 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 B12 deficiency 03/22/2016  . Vitamin D deficiency 03/22/2016  . Acanthosis nigricans 04/11/2015  . Major depression, chronic (HCC) 04/11/2015  . Gastro-esophageal reflux disease without esophagitis 04/11/2015  . Anterior knee pain 04/11/2015  . H/O suicide attempt 04/11/2015  . Migraine without aura and responsive to treatment 04/11/2015  . Seasonal allergic rhinitis 04/11/2015    Overnight/24hr events:  No events overnight  Subjective:  Patient is resting comfortably in bed. Denies concerns this morning.  Objective:   Vitals:   12/22/20 1040 12/22/20 1055  BP: (!) 134/57 (!) 132/56  Pulse: 94 94  Resp:    Temp:     Temp:  [98.1 F (36.7 C)-98.4 F (36.9 C)] 98.1 F (36.7 C) (05/12 0837) Pulse Rate:  [74-115] 94 (05/12 1055) Resp:  [14-18] 18 (05/12 0837) BP: (126-165)/(56-94) 132/56 (05/12 1055) Temp (24hrs), Avg:98.2 F (36.8 C), Min:98.1 F (36.7 C), Max:98.4 F (36.9 C)   Intake/Output Summary (Last 24 hours) at 12/22/2020 1151 Last data filed at 12/22/2020 0650 Gross per 24 hour  Intake --   Output 2050 ml  Net -2050 ml     Current Vital Signs 24h Vital Sign Ranges  T 98.1 F (36.7 C) Temp  Avg: 98.2 F (36.8 C)  Min: 98.1 F (36.7 C)  Max: 98.4 F (36.9 C)  BP (!) 132/56 BP  Min: 126/71  Max: 165/84  HR 94 Pulse  Avg: 90.2  Min: 74  Max: 115  RR 18 Resp  Avg: 15.4  Min: 14  Max: 18  SaO2     No data recorded       24 Hour I/O Current Shift I/O  Time Ins Outs 05/11 0701 - 05/12 0700 In: -  Out: 2250 [Urine:2250] No intake/output data recorded.   Patient Vitals for the past 24 hrs:  BP Temp Temp src Pulse Resp  12/22/20 1055 (!) 132/56 -- -- 94 --  12/22/20 1040 (!) 134/57 -- -- 94 --  12/22/20 1025 (!) 148/80 -- -- 91 --  12/22/20 1010 (!) 145/78 -- -- 91 --  12/22/20 0954 (!) 149/77 -- -- 91 --  12/22/20 0909 (!) 162/71 -- -- 85 --  12/22/20 0854 (!) 165/84 -- -- 88 --  12/22/20 0837 (!) 165/90 98.1 F (36.7 C) Oral 87 18  12/22/20 0744 (!) 153/76 -- -- 85 --  12/22/20 0644 (!) 153/92 -- -- 74 --  12/22/20 0520 (!) 143/73 -- -- 81 --  12/22/20 0420 (!) 142/67 -- -- 76 --  12/22/20 0320 137/74 -- -- 81 --  12/22/20 0220 (!) 140/56 -- -- 85 --  12/22/20 0120 131/62 -- -- 85 --  12/22/20 0020 (!) 144/65 98.2 F (36.8 C) Oral 88 15  12/21/20 2320 (!) 142/75 -- -- 87 --  12/21/20 2219 (!) 153/75 -- -- 90 --  12/21/20 2119 (!) 149/75 -- -- 99 --  12/21/20 2005 (!) 144/84 98.4 F (36.9 C) Oral 100 14  12/21/20 1744 (!) 146/94 -- -- 99 --  12/21/20 1640 (!) 149/77 98.1 F (36.7 C) Oral 89 16  12/21/20 1543 126/71 -- -- (!) 115 --  12/21/20 1444 128/66 -- -- (!) 102 --  12/21/20 1344 (!) 141/77 -- -- 95 --  12/21/20 1243 (!) 141/78 98.2 F (36.8 C) Oral 92 14    Physical exam: General: Well nourished, well developed female in no acute distress. Abdomen: gravid, non-tender Cardiovascular: S1, S2 normal, no murmur, rub or gallop, regular rate and rhythm Respiratory: CTAB Extremities: no cyanosis or edema Skin: Warm and dry.   Medications: Current  Facility-Administered Medications  Medication Dose Route Frequency Provider Last Rate Last Admin  . calcium carbonate (TUMS - dosed in mg elemental calcium) chewable tablet 400 mg of elemental calcium  2 tablet Oral TID PRN Zipporah Plants, CNM   400 mg of elemental calcium at 12/21/20 1811  . labetalol (NORMODYNE) injection 20 mg  20 mg Intravenous PRN Vena Austria, MD   20 mg at 12/22/20 0350   And  . labetalol (NORMODYNE) injection 40 mg  40 mg Intravenous PRN Vena Austria, MD   40 mg at 12/20/20 1534   And  . labetalol (NORMODYNE) injection 80 mg  80 mg Intravenous PRN Vena Austria, MD       And  . hydrALAZINE (APRESOLINE) injection 10 mg  10 mg Intravenous PRN Vena Austria, MD      . NIFEdipine (PROCARDIA-XL/NIFEDICAL-XL) 24 hr tablet 60 mg  60 mg Oral Daily Zipporah Plants, CNM   60 mg at 12/22/20 0938    Labs:  Recent Labs  Lab 12/20/20 1254 12/21/20 0700  WBC 7.5 10.4  HGB 11.1* 10.4*  HCT 33.3* 31.9*  PLT 338 326    Recent Labs  Lab 12/20/20 1254 12/21/20 0700  NA 135 133*  K 3.7 4.0  CL 104 104  CO2 22 20*  BUN 6 5*  CREATININE 0.59 0.50  CALCIUM 8.8* 8.8*  PROT 7.7 7.6  BILITOT 0.2* 0.4  ALKPHOS 74 79  ALT 8 8  AST 20 17  GLUCOSE 81 117*    Assessment & Plan:   30 yo, G2P1001, suspected hx of cHTN now with superimposed pre-eclampsia  1) Superimposed preeclampsia - Procardia dose increased to 60 mg XL  -S/P 2nd dose betamethasone  -Plan for close interval antenatal surveillance reviewed with patient  -complete 24 hour urine 2) Fetal well-being - reactive NST - baseline 145 bmp, 15x15 accels noted, -decels - appropriate for early gestational age  14) Disposition - Continue current care, possible discharge later today   Tresea Mall, CNM [facility] 12/22/2020 11:51 AM

## 2020-12-23 DIAGNOSIS — Z3A29 29 weeks gestation of pregnancy: Secondary | ICD-10-CM | POA: Diagnosis not present

## 2020-12-23 DIAGNOSIS — O10013 Pre-existing essential hypertension complicating pregnancy, third trimester: Secondary | ICD-10-CM | POA: Diagnosis not present

## 2020-12-23 DIAGNOSIS — O113 Pre-existing hypertension with pre-eclampsia, third trimester: Secondary | ICD-10-CM | POA: Diagnosis not present

## 2020-12-23 LAB — UPEP/TP, 24-HR URINE
Albumin, U: 58.5 %
Alpha 1, Urine: 9 %
Alpha 2, Urine: 8.5 %
Beta, Urine: 15.7 %
Gamma Globulin, Urine: 8.2 %
Total Protein, Urine-Ur/day: 1032 mg/24 hr — ABNORMAL HIGH (ref 30–150)
Total Protein, Urine: 54.3 mg/dL
Total Volume: 1900

## 2020-12-23 LAB — COMPREHENSIVE METABOLIC PANEL
ALT: 9 U/L (ref 0–44)
AST: 16 U/L (ref 15–41)
Albumin: 3.1 g/dL — ABNORMAL LOW (ref 3.5–5.0)
Alkaline Phosphatase: 73 U/L (ref 38–126)
Anion gap: 10 (ref 5–15)
BUN: 9 mg/dL (ref 6–20)
CO2: 23 mmol/L (ref 22–32)
Calcium: 9.1 mg/dL (ref 8.9–10.3)
Chloride: 100 mmol/L (ref 98–111)
Creatinine, Ser: 0.55 mg/dL (ref 0.44–1.00)
GFR, Estimated: >60 mL/min (ref >60)
Glucose, Bld: 87 mg/dL (ref 70–99)
Potassium: 3.7 mmol/L (ref 3.5–5.1)
Sodium: 133 mmol/L — ABNORMAL LOW (ref 135–145)
Total Bilirubin: 0.4 mg/dL (ref 0.3–1.2)
Total Protein: 7.8 g/dL (ref 6.5–8.1)

## 2020-12-23 LAB — CBC
HCT: 34 % — ABNORMAL LOW (ref 36.0–46.0)
Hemoglobin: 11.1 g/dL — ABNORMAL LOW (ref 12.0–15.0)
MCH: 28 pg (ref 26.0–34.0)
MCHC: 32.6 g/dL (ref 30.0–36.0)
MCV: 85.6 fL (ref 80.0–100.0)
Platelets: 369 10*3/uL (ref 150–400)
RBC: 3.97 MIL/uL (ref 3.87–5.11)
RDW: 13.2 % (ref 11.5–15.5)
WBC: 11.5 10*3/uL — ABNORMAL HIGH (ref 4.0–10.5)
nRBC: 0 % (ref 0.0–0.2)

## 2020-12-23 MED ORDER — NIFEDIPINE ER OSMOTIC RELEASE 30 MG PO TB24
90.0000 mg | ORAL_TABLET | Freq: Every day | ORAL | Status: DC
  Administered 2020-12-24: 90 mg via ORAL
  Filled 2020-12-23: qty 3

## 2020-12-23 MED ORDER — NIFEDIPINE ER OSMOTIC RELEASE 30 MG PO TB24
30.0000 mg | ORAL_TABLET | Freq: Once | ORAL | Status: AC
  Administered 2020-12-23: 30 mg via ORAL
  Filled 2020-12-23: qty 1

## 2020-12-23 MED ORDER — ACETAMINOPHEN 500 MG PO TABS
1000.0000 mg | ORAL_TABLET | Freq: Four times a day (QID) | ORAL | Status: DC | PRN
  Administered 2020-12-23 – 2020-12-30 (×6): 1000 mg via ORAL
  Filled 2020-12-23 (×6): qty 2

## 2020-12-23 NOTE — Progress Notes (Signed)
Pt brought back to [facility] for qshift NST. External monitors applied and assessing. Initial FHTs 150s. Pt reports +FM, denies LOF, ctx, or vaginal bleeding.

## 2020-12-23 NOTE — Progress Notes (Signed)
Qshift NST performed. Monitiors applied at 0523. Paper charting, will be sent to medical records. Discussed NST results with MD.

## 2020-12-23 NOTE — Progress Notes (Signed)
Obstetric and Gynecology  Subjective  Did have an occipital headache that resolved earlier this afternoon.  No RUQ/epigastric pain, no increased edema, no vision changes. +FM, no LOF, no VB, no contractions  Objective  Vital signs in last 24 hours: Temp:  [98 F (36.7 C)-98.7 F (37.1 C)] 98.7 F (37.1 C) (05/13 1158) Pulse Rate:  [83-97] 83 (05/13 1258) Resp:  [16-20] 20 (05/13 1158) BP: (111-161)/(63-117) 147/100 (05/13 1500) SpO2:  [97 %-100 %] 97 % (05/13 1258)    No intake or output data in the 24 hours ending 12/23/20 1836  General: Cardiovascular: Abdomen: Extremities:  Labs: Results for orders placed or performed during the hospital encounter of 12/20/20 (from the past 24 hour(s))  CBC     Status: Abnormal   Collection Time: 12/23/20  9:28 AM  Result Value Ref Range   WBC 11.5 (H) 4.0 - 10.5 K/uL   RBC 3.97 3.87 - 5.11 MIL/uL   Hemoglobin 11.1 (L) 12.0 - 15.0 g/dL   HCT 78.5 (L) 88.5 - 02.7 %   MCV 85.6 80.0 - 100.0 fL   MCH 28.0 26.0 - 34.0 pg   MCHC 32.6 30.0 - 36.0 g/dL   RDW 74.1 28.7 - 86.7 %   Platelets 369 150 - 400 K/uL   nRBC 0.0 0.0 - 0.2 %  Comprehensive metabolic panel     Status: Abnormal   Collection Time: 12/23/20  9:28 AM  Result Value Ref Range   Sodium 133 (L) 135 - 145 mmol/L   Potassium 3.7 3.5 - 5.1 mmol/L   Chloride 100 98 - 111 mmol/L   CO2 23 22 - 32 mmol/L   Glucose, Bld 87 70 - 99 mg/dL   BUN 9 6 - 20 mg/dL   Creatinine, Ser 6.72 0.44 - 1.00 mg/dL   Calcium 9.1 8.9 - 09.4 mg/dL   Total Protein 7.8 6.5 - 8.1 g/dL   Albumin 3.1 (L) 3.5 - 5.0 g/dL   AST 16 15 - 41 U/L   ALT 9 0 - 44 U/L   Alkaline Phosphatase 73 38 - 126 U/L   Total Bilirubin 0.4 0.3 - 1.2 mg/dL   GFR, Estimated >70 >96 mL/min   Anion gap 10 5 - 15    Cultures: Results for orders placed or performed during the hospital encounter of 12/20/20  Resp Panel by RT-PCR (Flu A&B, Covid) Nasopharyngeal Swab     Status: None   Collection Time: 12/22/20  5:30 PM    Specimen: Nasopharyngeal Swab; Nasopharyngeal(NP) swabs in vial transport medium  Result Value Ref Range Status   SARS Coronavirus 2 by RT PCR NEGATIVE NEGATIVE Final    Comment: (NOTE) SARS-CoV-2 target nucleic acids are NOT DETECTED.  The SARS-CoV-2 RNA is generally detectable in upper respiratory specimens during the acute phase of infection. The lowest concentration of SARS-CoV-2 viral copies this assay can detect is 138 copies/mL. A negative result does not preclude SARS-Cov-2 infection and should not be used as the sole basis for treatment or other patient management decisions. A negative result may occur with  improper specimen collection/handling, submission of specimen other than nasopharyngeal swab, presence of viral mutation(s) within the areas targeted by this assay, and inadequate number of viral copies(<138 copies/mL). A negative result must be combined with clinical observations, patient history, and epidemiological information. The expected result is Negative.  Fact Sheet for Patients:  BloggerCourse.com  Fact Sheet for Healthcare Providers:  SeriousBroker.it  This test is no t yet approved or cleared by  the [employer] and  has been authorized for detection and/or diagnosis of SARS-CoV-2 by FDA under an Emergency Use Authorization (EUA). This EUA will remain  in effect (meaning this test can be used) for the duration of the COVID-19 declaration under Section 564(b)(1) of the Act, 21 U.S.C.section 360bbb-3(b)(1), unless the authorization is terminated  or revoked sooner.       Influenza A by PCR NEGATIVE NEGATIVE Final   Influenza B by PCR NEGATIVE NEGATIVE Final    Comment: (NOTE) The Xpert Xpress SARS-CoV-2/FLU/RSV plus assay is intended as an aid in the diagnosis of influenza from Nasopharyngeal swab specimens and should not be used as a sole basis for treatment. Nasal washings and aspirates are  unacceptable for Xpert Xpress SARS-CoV-2/FLU/RSV testing.  Fact Sheet for Patients: BloggerCourse.com  Fact Sheet for Healthcare Providers: SeriousBroker.it  This test is not yet approved or cleared by the [place] FDA and has been authorized for detection and/or diagnosis of SARS-CoV-2 by FDA under an Emergency Use Authorization (EUA). This EUA will remain in effect (meaning this test can be used) for the duration of the COVID-19 declaration under Section 564(b)(1) of the Act, 21 U.S.C. section 360bbb-3(b)(1), unless the authorization is terminated or revoked.  Performed at [facility], [address], [place], [place] [address]     Imaging:  Assessment   30 y.o. G2P1001 at [redacted]w[redacted]d by Estimated Date of Delivery: 03/05/21 HD 4 superimposed preeclampsia  Plan    1) Superimposed preeclampsia  - continue to trend CMP and CBC daily.  24-hr urine protein 1,032g on 12/22/2019 - daily weights - further escalation in hypertensive therapy today to procardia XL 90mg  - s/p BMZ 5/10 & 5/11 - EFW 1315g or 36%ile on 5/9 AFI 11.1cm - Goal is 24-hrs of BP not requiring IV treatment and or escalation in po antihypertensives.  If this is unable to be achieved or patient develops criteria for superimposed preeclampsia with severe symptoms would proceed with delivery.  We discussed that at this gestational age C-section would likley be necessary as opposed to [facility]

## 2020-12-23 NOTE — Progress Notes (Signed)
Called to pt's room because patient stated she was dizzy. Patient stated she was up to use the bathroom and walked around for a minute before she felt dizzy. Also complained of a dull headache. Pt's BP 161/117. RN instructed patient to lay down in the bed. Recheck BP of 158/96. Patient stated she feels better since lying down. CNM updated. Will continue to monitor.

## 2020-12-23 NOTE — Progress Notes (Signed)
Daily Antepartum Note  Admission Date: 12/20/2020 Current Date: 12/23/2020 11:06 AM  Kristina Mccann is a 30 y.o. G2P1001 @ [redacted]w[redacted]d by LMP consistent with 14 week [place], admitted for elevated BP in third trimester of pregnancy and improved BP control with medication dosing adjustments. The patient denies S&S of severe preE including headache with vision changes, RUQ pain, SOB, or edema. Patient reports +FM, denies LOF, VB, or ctx at this time.   Pregnancy complicated by:   Patient Active Problem List   Diagnosis Date Noted  . Elevated blood pressure affecting pregnancy in third trimester, antepartum 12/20/2020  . Pregnancy with history of cesarean section, antepartum 12/12/2020  . Marijuana user 09/18/2020  . Maternal morbid obesity in third trimester, antepartum (HCC) 09/08/2020  . BMI 40.0-44.9, adult (HCC) 09/08/2020  . Supervision of high risk pregnancy, antepartum 09/02/2020  . 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 B12 deficiency 03/22/2016  . Vitamin D deficiency 03/22/2016  . Acanthosis nigricans 04/11/2015  . Major depression, chronic (HCC) 04/11/2015  . Gastro-esophageal reflux disease without esophagitis 04/11/2015  . Anterior knee pain 04/11/2015  . H/O suicide attempt 04/11/2015  . Migraine without aura and responsive to treatment 04/11/2015  . Seasonal allergic rhinitis 04/11/2015    Overnight/24hr events:  No events overnight  Subjective:  Patient is resting comfortably in bed. Denies concerns this morning. We discussed observing today for control of blood pressure with possible discharge later in the day. 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 Precautions and recommendations reviewed.  Objective:   Vitals:   12/23/20 0900 12/23/20 1000  BP: 138/88 111/65  Pulse:    Resp:    Temp:    SpO2:     Temp:  [98 F (36.7 C)-98.7 F (37.1 C)] 98.7 F (37.1 C) (05/13 0811) Pulse Rate:  [84-106] 96 (05/13 0811) Resp:  [16-20] 20  (05/13 0811) BP: (111-161)/(63-100) 111/65 (05/13 1000) SpO2:  [98 %-100 %] 98 % (05/13 0811) Temp (24hrs), Avg:98.4 F (36.9 C), Min:98 F (36.7 C), Max:98.7 F (37.1 C)  No intake or output data in the 24 hours ending 12/23/20 1106   Current Vital Signs 24h Vital Sign Ranges  T 98.7 F (37.1 C) Temp  Avg: 98.4 F (36.9 C)  Min: 98 F (36.7 C)  Max: 98.7 F (37.1 C)  BP 111/65 BP  Min: 111/65  Max: 161/85  HR 96 Pulse  Avg: 95.6  Min: 84  Max: 106  RR 20 Resp  Avg: 18.6  Min: 16  Max: 20  SaO2 98 % Room Air SpO2  Avg: 99.3 %  Min: 98 %  Max: 100 %       24 Hour I/O Current Shift I/O  Time Ins Outs No intake/output data recorded. No intake/output data recorded.   Patient Vitals for the past 24 hrs:  BP Temp Temp src Pulse Resp SpO2  12/23/20 1000 111/65 -- -- -- -- --  12/23/20 0900 138/88 -- -- -- -- --  12/23/20 0811 138/87 98.7 F (37.1 C) Oral 96 20 98 %  12/23/20 0700 128/70 -- -- -- -- --  12/23/20 0653 139/75 -- -- -- -- --  12/23/20 0500 (!) 145/100 -- -- -- -- --  12/23/20 0400 136/89 -- -- -- -- --  12/23/20 0304 (!) 151/76 -- -- -- -- --  12/23/20 0300 -- 98.5 F (36.9 C) Oral 87 18 100 %  12/23/20 0206 (!) 146/77 -- -- -- -- --  12/23/20 0100 140/90 -- -- -- -- --  12/23/20 0015 (!) 131/93 -- -- -- -- --  12/22/20 2343 130/80 98.1 F (36.7 C) Oral 96 20 100 %  12/22/20 2300 (!) 144/80 -- -- -- -- --  12/22/20 2200 (!) 137/95 -- -- -- -- --  12/22/20 2110 -- 98.6 F (37 C) Oral 84 20 100 %  12/22/20 2100 117/63 -- -- -- -- --  12/22/20 1957 125/87 98 F (36.7 C) Oral 96 18 100 %  12/22/20 1900 130/87 -- -- -- -- --  12/22/20 1840 129/87 98.5 F (36.9 C) Oral 96 16 98 %  12/22/20 1800 (!) 147/83 -- -- (!) 103 -- --  12/22/20 1746 (!) 161/85 -- -- 100 -- --  12/22/20 1645 (!) 145/80 -- -- 94 -- --  12/22/20 1545 (!) 156/78 -- -- (!) 106 -- --  12/22/20 1445 (!) 143/82 -- -- 93 -- --  12/22/20 1345 -- 98.4 F (36.9 C) Oral -- 18 --  12/22/20 1139  (!) 154/74 -- -- 96 -- --    Physical exam: General: Well nourished, well developed female in no acute distress. Abdomen: gravid, non-tender Cardiovascular: S1, S2 normal, no murmur, rub or gallop, regular rate and rhythm Respiratory: CTAB Extremities: no cyanosis or edema Skin: Warm and dry.   Medications: Current Facility-Administered Medications  Medication Dose Route Frequency Provider Last Rate Last Admin  . calcium carbonate (TUMS - dosed in mg elemental calcium) chewable tablet 400 mg of elemental calcium  2 tablet Oral TID PRN Zipporah Plants, CNM   400 mg of elemental calcium at 12/23/20 0020  . calcium carbonate (TUMS - dosed in mg elemental calcium) chewable tablet 400 mg of elemental calcium  400 mg of elemental calcium Oral BID Tresea Mall, CNM   400 mg of elemental calcium at 12/23/20 0927  . labetalol (NORMODYNE) injection 20 mg  20 mg Intravenous PRN Vena Austria, MD   20 mg at 12/22/20 3532   And  . labetalol (NORMODYNE) injection 40 mg  40 mg Intravenous PRN Vena Austria, MD   40 mg at 12/20/20 1534   And  . labetalol (NORMODYNE) injection 80 mg  80 mg Intravenous PRN Vena Austria, MD       And  . hydrALAZINE (APRESOLINE) injection 10 mg  10 mg Intravenous PRN Vena Austria, MD      . NIFEdipine (PROCARDIA-XL/NIFEDICAL-XL) 24 hr tablet 60 mg  60 mg Oral Daily Zipporah Plants, CNM   60 mg at 12/23/20 9924    Labs:  Recent Labs  Lab 12/20/20 1254 12/21/20 0700 12/23/20 0928  WBC 7.5 10.4 11.5*  HGB 11.1* 10.4* 11.1*  HCT 33.3* 31.9* 34.0*  PLT 338 326 369    Recent Labs  Lab 12/20/20 1254 12/21/20 0700 12/23/20 0928  NA 135 133* 133*  K 3.7 4.0 3.7  CL 104 104 100  CO2 22 20* 23  BUN 6 5* 9  CREATININE 0.59 0.50 0.55  CALCIUM 8.8* 8.8* 9.1  PROT 7.7 7.6 7.8  BILITOT 0.2* 0.4 0.4  ALKPHOS 74 79 73  ALT 8 8 9   AST 20 17 16   GLUCOSE 81 117* 87    Assessment & Plan:   30 yo, G2P1001, suspected hx of cHTN now with superimposed  pre-eclampsia  1) Superimposed preeclampsia - Procardia dose 60 mg XL  -S/P 2nd dose betamethasone  -Plan for close interval antenatal surveillance reviewed with patient  -24 hour urine results pending  -home use BP  cuff ordered through [facility] 2) Fetal well-being - reactive NST - baseline 135 bmp, 15x15 accels noted, -decels - appropriate for early gestational age  33) Disposition - Continue current care, discharge likely later today   Tresea Mall, CNM [facility] 12/23/2020 11:06 AM

## 2020-12-23 NOTE — Progress Notes (Signed)
Notified by RN of elevated severe range blood pressure after patient was out of bed and ambulating in room. Repeat BP is 150's over 90's. Reviewed with Dr Bonney Aid- his recommendation is to increase Procardia to 90 XL per day if BPs remain elevated. Will continue to assess.   Tresea Mall, CNM

## 2020-12-24 DIAGNOSIS — Z3A29 29 weeks gestation of pregnancy: Secondary | ICD-10-CM | POA: Diagnosis not present

## 2020-12-24 DIAGNOSIS — O10913 Unspecified pre-existing hypertension complicating pregnancy, third trimester: Secondary | ICD-10-CM

## 2020-12-24 DIAGNOSIS — O113 Pre-existing hypertension with pre-eclampsia, third trimester: Secondary | ICD-10-CM | POA: Diagnosis not present

## 2020-12-24 LAB — CBC
HCT: 35.4 % — ABNORMAL LOW (ref 36.0–46.0)
Hemoglobin: 11.7 g/dL — ABNORMAL LOW (ref 12.0–15.0)
MCH: 28.5 pg (ref 26.0–34.0)
MCHC: 33.1 g/dL (ref 30.0–36.0)
MCV: 86.1 fL (ref 80.0–100.0)
Platelets: 364 10*3/uL (ref 150–400)
RBC: 4.11 MIL/uL (ref 3.87–5.11)
RDW: 13.3 % (ref 11.5–15.5)
WBC: 12.4 10*3/uL — ABNORMAL HIGH (ref 4.0–10.5)
nRBC: 0 % (ref 0.0–0.2)

## 2020-12-24 LAB — COMPREHENSIVE METABOLIC PANEL WITH GFR
ALT: 8 U/L (ref 0–44)
AST: 15 U/L (ref 15–41)
Albumin: 3.1 g/dL — ABNORMAL LOW (ref 3.5–5.0)
Alkaline Phosphatase: 75 U/L (ref 38–126)
Anion gap: 9 (ref 5–15)
BUN: 11 mg/dL (ref 6–20)
CO2: 24 mmol/L (ref 22–32)
Calcium: 9.4 mg/dL (ref 8.9–10.3)
Chloride: 101 mmol/L (ref 98–111)
Creatinine, Ser: 0.57 mg/dL (ref 0.44–1.00)
GFR, Estimated: >60 mL/min
Glucose, Bld: 95 mg/dL (ref 70–99)
Potassium: 3.8 mmol/L (ref 3.5–5.1)
Sodium: 134 mmol/L — ABNORMAL LOW (ref 135–145)
Total Bilirubin: 0.4 mg/dL (ref 0.3–1.2)
Total Protein: 7.8 g/dL (ref 6.5–8.1)

## 2020-12-24 MED ORDER — NIFEDIPINE ER OSMOTIC RELEASE 30 MG PO TB24: 60.0000 mg | ORAL_TABLET | Freq: Every day | ORAL | Status: DC

## 2020-12-24 MED ORDER — NIFEDIPINE ER OSMOTIC RELEASE 30 MG PO TB24
30.0000 mg | ORAL_TABLET | Freq: Every day | ORAL | Status: DC
  Administered 2020-12-25 – 2021-01-10 (×17): 30 mg via ORAL
  Filled 2020-12-24 (×17): qty 1

## 2020-12-24 MED ORDER — LACTATED RINGERS IV BOLUS
500.0000 mL | Freq: Once | INTRAVENOUS | Status: AC
  Administered 2020-12-24: 500 mL via INTRAVENOUS

## 2020-12-24 MED ORDER — LACTATED RINGERS IV SOLN: INTRAVENOUS | Status: DC

## 2020-12-24 NOTE — Progress Notes (Signed)
Spoke with Annabelle Harman in B/P and notified her that Pt needs NST this shift. Annabelle Harman stated she will call me back with the Observation room that Pt is to go to and time for NST tonight.

## 2020-12-24 NOTE — Progress Notes (Signed)
Back to Room Post NST.

## 2020-12-24 NOTE — Progress Notes (Signed)
Subjective:   She is feeling well. No complaints.  She denies headaches, vision changes, RUQ pain and, nausea, vomiting, or swelling. She denies vaginal bleeding. She feels normal fetal movement.   Objective:   Blood pressure 126/76, pulse 90, temperature 97.7 F (36.5 C), temperature source Oral, resp. rate 18, height 5\' 2"  (1.575 m), weight 102.5 kg, last menstrual period 05/29/2020, SpO2 100 %.  General: NAD Pulmonary: no increased work of breathing Abdomen: non-distended, non-tender Extremities: no edema, no erythema, no tenderness, no signs of DVT  Results for orders placed or performed during the hospital encounter of 12/20/20 (from the past 72 hour(s))  Protein Electro, 24-Hour Urine     Status: Abnormal   Collection Time: 12/22/20  9:41 AM  Result Value Ref Range   Total Protein, Urine 54.3 Not Estab. mg/dL   Total Protein, Urine-Ur/day 1,032 (H) 30 - 150 mg/24 hr   Albumin, U 58.5 %   Alpha 1, Urine 9.0 %   Alpha 2, Urine 8.5 %   Beta, Urine 15.7 %   Gamma Globulin, Urine 8.2 %   M-spike, % Not Observed Not Observed %   Please Note: Comment     Comment: (NOTE) Protein electrophoresis scan will follow via computer, mail, or courier delivery. Performed At: [facility] [address] [place], [place] [id number] MD Jolene Schimke    Total Volume 1,900     Comment: Performed at [facility], [address] [place], [place]  Resp Panel by RT-PCR (Flu A&B, Covid) Nasopharyngeal Swab     Status: None   Collection Time: 12/22/20  5:30 PM   Specimen: Nasopharyngeal Swab; Nasopharyngeal(NP) swabs in vial transport medium  Result Value Ref Range   SARS Coronavirus 2 by RT PCR NEGATIVE NEGATIVE    Comment: (NOTE) SARS-CoV-2 target nucleic acids are NOT DETECTED.  The SARS-CoV-2 RNA is generally detectable in upper respiratory specimens during the acute phase of infection. The lowest concentration of SARS-CoV-2 viral copies this  assay can detect is 138 copies/mL. A negative result does not preclude SARS-Cov-2 infection and should not be used as the sole basis for treatment or other patient management decisions. A negative result may occur with  improper specimen collection/handling, submission of specimen other than nasopharyngeal swab, presence of viral mutation(s) within the areas targeted by this assay, and inadequate number of viral copies(<138 copies/mL). A negative result must be combined with clinical observations, patient history, and epidemiological information. The expected result is Negative.  Fact Sheet for Patients:  02/21/21  Fact Sheet for Healthcare Providers:  BloggerCourse.com  This test is no t yet approved or cleared by the SeriousBroker.it FDA and  has been authorized for detection and/or diagnosis of SARS-CoV-2 by FDA under an Emergency Use Authorization (EUA). This EUA will remain  in effect (meaning this test can be used) for the duration of the COVID-19 declaration under Section 564(b)(1) of the Act, 21 U.S.C.section 360bbb-3(b)(1), unless the authorization is terminated  or revoked sooner.       Influenza A by PCR NEGATIVE NEGATIVE   Influenza B by PCR NEGATIVE NEGATIVE    Comment: (NOTE) The Xpert Xpress SARS-CoV-2/FLU/RSV plus assay is intended as an aid in the diagnosis of influenza from Nasopharyngeal swab specimens and should not be used as a sole basis for treatment. Nasal washings and aspirates are unacceptable for Xpert Xpress SARS-CoV-2/FLU/RSV testing.  Fact Sheet for Patients: [place]  Fact Sheet for Healthcare Providers: BloggerCourse.com  This test is not yet  approved or cleared by the [place] and has been authorized for detection and/or diagnosis of SARS-CoV-2 by FDA under an Emergency Use Authorization (EUA). This EUA will remain in  effect (meaning this test can be used) for the duration of the COVID-19 declaration under Section 564(b)(1) of the Act, 21 U.S.C. section 360bbb-3(b)(1), unless the authorization is terminated or revoked.  Performed at [facility], [address] [place], [place] [address]   CBC     Status: Abnormal   Collection Time: 12/23/20  9:28 AM  Result Value Ref Range   WBC 11.5 (H) 4.0 - 10.5 K/uL   RBC 3.97 3.87 - 5.11 MIL/uL   Hemoglobin 11.1 (L) 12.0 - 15.0 g/dL   HCT 17.0 (L) 01.7 - 49.4 %   MCV 85.6 80.0 - 100.0 fL   MCH 28.0 26.0 - 34.0 pg   MCHC 32.6 30.0 - 36.0 g/dL   RDW 49.6 75.9 - 16.3 %   Platelets 369 150 - 400 K/uL   nRBC 0.0 0.0 - 0.2 %    Comment: Performed at [facility], [address] [place], [place] [address]  Comprehensive metabolic panel     Status: Abnormal   Collection Time: 12/23/20  9:28 AM  Result Value Ref Range   Sodium 133 (L) 135 - 145 mmol/L   Potassium 3.7 3.5 - 5.1 mmol/L   Chloride 100 98 - 111 mmol/L   CO2 23 22 - 32 mmol/L   Glucose, Bld 87 70 - 99 mg/dL    Comment: Glucose reference range applies only to samples taken after fasting for at least 8 hours.   BUN 9 6 - 20 mg/dL   Creatinine, Ser 9.93 0.44 - 1.00 mg/dL   Calcium 9.1 8.9 - 57.0 mg/dL   Total Protein 7.8 6.5 - 8.1 g/dL   Albumin 3.1 (L) 3.5 - 5.0 g/dL   AST 16 15 - 41 U/L   ALT 9 0 - 44 U/L   Alkaline Phosphatase 73 38 - 126 U/L   Total Bilirubin 0.4 0.3 - 1.2 mg/dL   GFR, Estimated >17 >79 mL/min    Comment: (NOTE) Calculated using the CKD-EPI Creatinine Equation (2021)    Anion gap 10 5 - 15    Comment: Performed at [facility], [address] [place], [place] [address]  CBC     Status: Abnormal   Collection Time: 12/24/20  5:42 AM  Result Value Ref Range   WBC 12.4 (H) 4.0 - 10.5 K/uL   RBC 4.11 3.87 - 5.11 MIL/uL   Hemoglobin 11.7 (L) 12.0 - 15.0 g/dL   HCT 09.2 (L) 33.0 - 07.6 %   MCV 86.1 80.0 - 100.0 fL   MCH 28.5 26.0 - 34.0 pg    MCHC 33.1 30.0 - 36.0 g/dL   RDW 22.6 33.3 - 54.5 %   Platelets 364 150 - 400 K/uL   nRBC 0.0 0.0 - 0.2 %    Comment: Performed at [facility], [address] [place], [place] [address]  Comprehensive metabolic panel     Status: Abnormal   Collection Time: 12/24/20  5:42 AM  Result Value Ref Range   Sodium 134 (L) 135 - 145 mmol/L   Potassium 3.8 3.5 - 5.1 mmol/L   Chloride 101 98 - 111 mmol/L   CO2 24 22 - 32 mmol/L   Glucose, Bld 95 70 - 99 mg/dL    Comment: Glucose reference range applies only to samples taken  after fasting for at least 8 hours.   BUN 11 6 - 20 mg/dL   Creatinine, Ser 7.40 0.44 - 1.00 mg/dL   Calcium 9.4 8.9 - 81.4 mg/dL   Total Protein 7.8 6.5 - 8.1 g/dL   Albumin 3.1 (L) 3.5 - 5.0 g/dL   AST 15 15 - 41 U/L   ALT 8 0 - 44 U/L   Alkaline Phosphatase 75 38 - 126 U/L   Total Bilirubin 0.4 0.3 - 1.2 mg/dL   GFR, Estimated >48 >18 mL/min    Comment: (NOTE) Calculated using the CKD-EPI Creatinine Equation (2021)    Anion gap 9 5 - 15    Comment: Performed at [facility], [address] [place], [place] [address]    Assessment:   30 y.o. [id number] [redacted]w[redacted]d Hospital day # 5- chronic HTN with superimposed preeclampsia  Plan:   1) Continue care for BP monitoring. Patients blood pressures have been well controlled on 90 mg of Procardia. Anticipate discharge home tomorrow if BP remain stable and labs are WNL.  2) Regular diet 3) NSTs every shift.   Adelene Idler MD, [facility], [facility] 12/24/2020 10:11 AM

## 2020-12-24 NOTE — Plan of Care (Signed)
Alert and oriented with quiet affect. Color good, skin w&d. BBS clear. Color good, skin w&d. Abdomen soft to palpation, Pt. States positive fetal movement. FHT is 144 by doppler. Pt. Denies vaginal bleeding and/or vaginal discharge. Denies all PIH s/s except for H/A rated 3/10; Tylenol given as per PRN order. B/P is 131/87. HR reg. At 102. Reflexes WNL. States she feels "better' other then headache. Instructed Pt. To call with any c/o and Pt. V/o.

## 2020-12-24 NOTE — Progress Notes (Signed)
Patient called RN to room, c/o dizziness, sweating and flushed face.  RN saw last BP @ 15 was 67/43.  RN asked patient to lie down and placed wey rag on pt face.  Serial BPs obtained: 80/39, 118/74.  MD ordered 500 ml IVF bolus ans will change procardia to 60 mg qd.

## 2020-12-24 NOTE — Progress Notes (Signed)
Pt. in Labor & Delivery for qShift NST; started on Night Shift. Pt. on monitors since 0724 d/t absence of 15x15 accelerations. Strip reviewed by Jerene Pitch, MD. Baseline 135-140, moderate variability, 10x10 accelerations with few 15x15 accelerations noted when baseline changed to 135. Overall, NST reactive for 29w fetus. Will reassess during next shift NST. Provider okay with reactivity for now and approved pt. returning to [facility]. Pt. returned to Room 345 and update given to Harless Litten, Charity fundraiser.

## 2020-12-24 NOTE — Progress Notes (Signed)
To L&D for NST 

## 2020-12-25 DIAGNOSIS — O113 Pre-existing hypertension with pre-eclampsia, third trimester: Secondary | ICD-10-CM | POA: Diagnosis not present

## 2020-12-25 DIAGNOSIS — Z3A29 29 weeks gestation of pregnancy: Secondary | ICD-10-CM | POA: Diagnosis not present

## 2020-12-25 DIAGNOSIS — O10913 Unspecified pre-existing hypertension complicating pregnancy, third trimester: Secondary | ICD-10-CM | POA: Diagnosis not present

## 2020-12-25 NOTE — Progress Notes (Signed)
Subjective:  She is feeling well. No complaints.  She denies headaches, vision changes, RUQ pain and, nausea, vomiting, or swelling. She denies vaginal bleeding. She feels normal fetal movement. Yesterday she had an event of significant hypotension. Medication was adjusted and her BP improved with IV fluids.   Objective:   Blood pressure 110/69, pulse 98, temperature 98.2 F (36.8 C), temperature source Oral, resp. rate 18, height 5\' 2"  (1.575 m), weight 100.2 kg, last menstrual period 05/29/2020, SpO2 98 %.  General: NAD Pulmonary: no increased work of breathing Abdomen: non-distended, non-tender Extremities: no edema, no erythema, no tenderness, no signs of DVT  Results for orders placed or performed during the hospital encounter of 12/20/20 (from the past 72 hour(s))  Resp Panel by RT-PCR (Flu A&B, Covid) Nasopharyngeal Swab     Status: None   Collection Time: 12/22/20  5:30 PM   Specimen: Nasopharyngeal Swab; Nasopharyngeal(NP) swabs in vial transport medium  Result Value Ref Range   SARS Coronavirus 2 by RT PCR NEGATIVE NEGATIVE    Comment: (NOTE) SARS-CoV-2 target nucleic acids are NOT DETECTED.  The SARS-CoV-2 RNA is generally detectable in upper respiratory specimens during the acute phase of infection. The lowest concentration of SARS-CoV-2 viral copies this assay can detect is 138 copies/mL. A negative result does not preclude SARS-Cov-2 infection and should not be used as the sole basis for treatment or other patient management decisions. A negative result may occur with  improper specimen collection/handling, submission of specimen other than nasopharyngeal swab, presence of viral mutation(s) within the areas targeted by this assay, and inadequate number of viral copies(<138 copies/mL). A negative result must be combined with clinical observations, patient history, and epidemiological information. The expected result is Negative.  Fact Sheet for Patients:   02/21/21  Fact Sheet for Healthcare Providers:  BloggerCourse.com  This test is no t yet approved or cleared by the SeriousBroker.it FDA and  has been authorized for detection and/or diagnosis of SARS-CoV-2 by FDA under an Emergency Use Authorization (EUA). This EUA will remain  in effect (meaning this test can be used) for the duration of the COVID-19 declaration under Section 564(b)(1) of the Act, 21 U.S.C.section 360bbb-3(b)(1), unless the authorization is terminated  or revoked sooner.       Influenza A by PCR NEGATIVE NEGATIVE   Influenza B by PCR NEGATIVE NEGATIVE    Comment: (NOTE) The Xpert Xpress SARS-CoV-2/FLU/RSV plus assay is intended as an aid in the diagnosis of influenza from Nasopharyngeal swab specimens and should not be used as a sole basis for treatment. Nasal washings and aspirates are unacceptable for Xpert Xpress SARS-CoV-2/FLU/RSV testing.  Fact Sheet for Patients: [place]  Fact Sheet for Healthcare Providers: BloggerCourse.com  This test is not yet approved or cleared by the SeriousBroker.it FDA and has been authorized for detection and/or diagnosis of SARS-CoV-2 by FDA under an Emergency Use Authorization (EUA). This EUA will remain in effect (meaning this test can be used) for the duration of the COVID-19 declaration under Section 564(b)(1) of the Act, 21 U.S.C. section 360bbb-3(b)(1), unless the authorization is terminated or revoked.  Performed at [facility], [address] [place], [place]   CBC     Status: Abnormal   Collection Time: 12/23/20  9:28 AM  Result Value Ref Range   WBC 11.5 (H) 4.0 - 10.5 K/uL   RBC 3.97 3.87 - 5.11 MIL/uL   Hemoglobin 11.1 (L) 12.0 - 15.0 g/dL   HCT 12/25/20 (L) 94.5 -  46.0 %   MCV 85.6 80.0 - 100.0 fL   MCH 28.0 26.0 - 34.0 pg   MCHC 32.6 30.0 - 36.0 g/dL   RDW 80.9 98.3 - 38.2  %   Platelets 369 150 - 400 K/uL   nRBC 0.0 0.0 - 0.2 %    Comment: Performed at [facility], [address] [place], [place] [address]  Comprehensive metabolic panel     Status: Abnormal   Collection Time: 12/23/20  9:28 AM  Result Value Ref Range   Sodium 133 (L) 135 - 145 mmol/L   Potassium 3.7 3.5 - 5.1 mmol/L   Chloride 100 98 - 111 mmol/L   CO2 23 22 - 32 mmol/L   Glucose, Bld 87 70 - 99 mg/dL    Comment: Glucose reference range applies only to samples taken after fasting for at least 8 hours.   BUN 9 6 - 20 mg/dL   Creatinine, Ser 7.67 0.44 - 1.00 mg/dL   Calcium 9.1 8.9 - 34.1 mg/dL   Total Protein 7.8 6.5 - 8.1 g/dL   Albumin 3.1 (L) 3.5 - 5.0 g/dL   AST 16 15 - 41 U/L   ALT 9 0 - 44 U/L   Alkaline Phosphatase 73 38 - 126 U/L   Total Bilirubin 0.4 0.3 - 1.2 mg/dL   GFR, Estimated >93 >79 mL/min    Comment: (NOTE) Calculated using the CKD-EPI Creatinine Equation (2021)    Anion gap 10 5 - 15    Comment: Performed at [facility], [address] [place], [place] [address]  CBC     Status: Abnormal   Collection Time: 12/24/20  5:42 AM  Result Value Ref Range   WBC 12.4 (H) 4.0 - 10.5 K/uL   RBC 4.11 3.87 - 5.11 MIL/uL   Hemoglobin 11.7 (L) 12.0 - 15.0 g/dL   HCT 73.5 (L) 32.9 - 92.4 %   MCV 86.1 80.0 - 100.0 fL   MCH 28.5 26.0 - 34.0 pg   MCHC 33.1 30.0 - 36.0 g/dL   RDW 26.8 34.1 - 96.2 %   Platelets 364 150 - 400 K/uL   nRBC 0.0 0.0 - 0.2 %    Comment: Performed at [facility] [place], [address] [place], [place] [address]  Comprehensive metabolic panel     Status: Abnormal   Collection Time: 12/24/20  5:42 AM  Result Value Ref Range   Sodium 134 (L) 135 - 145 mmol/L   Potassium 3.8 3.5 - 5.1 mmol/L   Chloride 101 98 - 111 mmol/L   CO2 24 22 - 32 mmol/L   Glucose, Bld 95 70 - 99 mg/dL    Comment: Glucose reference range applies only to samples taken after fasting for at least 8 hours.   BUN 11 6 - 20 mg/dL   Creatinine, Ser  8.92 0.44 - 1.00 mg/dL   Calcium 9.4 8.9 - 11.9 mg/dL   Total Protein 7.8 6.5 - 8.1 g/dL   Albumin 3.1 (L) 3.5 - 5.0 g/dL   AST 15 15 - 41 U/L   ALT 8 0 - 44 U/L   Alkaline Phosphatase 75 38 - 126 U/L   Total Bilirubin 0.4 0.3 - 1.2 mg/dL   GFR, Estimated >41 >74 mL/min    Comment: (NOTE) Calculated using the CKD-EPI Creatinine Equation (2021)    Anion gap 9 5 - 15    Comment: Performed at [facility], [address], [place], [place] [address]  Assessment:   30 y.o. G2P1001 [redacted]w[redacted]d  Hospital day # 6- chronic HTN with superimposed preeclampsia    Plan:   1) Continue care for BP monitoring. Lowering procardia to 30 mg today given significant hypotension yesterday, slow dose adjustments.  2) Regular diet 3) NSTs every shift.   Adelene Idler MD [facility], [facility] 12/25/2020 11:11 AM

## 2020-12-25 NOTE — Progress Notes (Signed)
To BP for NST with Ardine Eng RN.

## 2020-12-25 NOTE — Progress Notes (Signed)
Notified B/P that NST for this shift needs to be done. Ardine Eng RN stated Pt. Would be called for after OBS Patients seen.

## 2020-12-26 DIAGNOSIS — I959 Hypotension, unspecified: Secondary | ICD-10-CM | POA: Diagnosis not present

## 2020-12-26 DIAGNOSIS — O10013 Pre-existing essential hypertension complicating pregnancy, third trimester: Secondary | ICD-10-CM

## 2020-12-26 DIAGNOSIS — Z3A30 30 weeks gestation of pregnancy: Secondary | ICD-10-CM

## 2020-12-26 DIAGNOSIS — O113 Pre-existing hypertension with pre-eclampsia, third trimester: Secondary | ICD-10-CM

## 2020-12-26 LAB — COMPREHENSIVE METABOLIC PANEL
ALT: 7 U/L (ref 0–44)
AST: 13 U/L — ABNORMAL LOW (ref 15–41)
Albumin: 2.9 g/dL — ABNORMAL LOW (ref 3.5–5.0)
Alkaline Phosphatase: 74 U/L (ref 38–126)
Anion gap: 8 (ref 5–15)
BUN: 15 mg/dL (ref 6–20)
CO2: 24 mmol/L (ref 22–32)
Calcium: 9.1 mg/dL (ref 8.9–10.3)
Chloride: 102 mmol/L (ref 98–111)
Creatinine, Ser: 0.77 mg/dL (ref 0.44–1.00)
GFR, Estimated: >60 mL/min (ref >60)
Glucose, Bld: 101 mg/dL — ABNORMAL HIGH (ref 70–99)
Potassium: 4.1 mmol/L (ref 3.5–5.1)
Sodium: 134 mmol/L — ABNORMAL LOW (ref 135–145)
Total Bilirubin: 0.3 mg/dL (ref 0.3–1.2)
Total Protein: 7.4 g/dL (ref 6.5–8.1)

## 2020-12-26 LAB — CBC
HCT: 35.6 % — ABNORMAL LOW (ref 36.0–46.0)
Hemoglobin: 12 g/dL (ref 12.0–15.0)
MCH: 28.8 pg (ref 26.0–34.0)
MCHC: 33.7 g/dL (ref 30.0–36.0)
MCV: 85.6 fL (ref 80.0–100.0)
Platelets: 383 10*3/uL (ref 150–400)
RBC: 4.16 MIL/uL (ref 3.87–5.11)
RDW: 13 % (ref 11.5–15.5)
WBC: 10.2 10*3/uL (ref 4.0–10.5)
nRBC: 0 % (ref 0.0–0.2)

## 2020-12-26 NOTE — Progress Notes (Signed)
Notified via phone by bedside RN that patient had episode of hypotension following shower today. Presented to bedside to evaluate patient. Patient was lying in bed with eyes closed, responsive to questions. Patient reported that after taking her shower she experienced a period of lightheadedness/dizziness associated with SOB and pressure in her upper abdomen, during this period she saw spots in her vision. Blood pressure during initial evaluation following this event by bedside RN was 146/129 followed immediately by a reading of 81/62. While this provider was at the bedside the patient was noted to be normotensive (117/72). The patient reported a resolution in her symptoms upon lying down.   -Labs and EKG ordered - results pending. -Episode reviewed with Dr. [name] - no further recommendations at this time.  -Patient's case reviewed prior to this incident via phone with Dr. Grace Bushy of [facility]FM - awaiting callback for further recs.  Kristina Mccann, CNM [facility], [facility] 12/26/2020 12:45 PM

## 2020-12-26 NOTE — Progress Notes (Addendum)
Spoke with Dr. Grace Bushy, MFM, via phone regarding patient's plan of care. Recommendation per Dr Grace Bushy to continue current care for next 24 hours - plan for in person MFM evaluation tomorrow. Reviewed episode of hypotension and associated symptoms with Dr. Grace Bushy who recommended continued close monitoring and encouraged oral hydration.  Plan reviewed with Dr. [name] via phone.  Zipporah Plants, CNM [facility], [facility] 12/26/2020 01:10 PM

## 2020-12-26 NOTE — Progress Notes (Signed)
Pt up to shower. After shower, patient called out complaining of light headedness. Initial BP of 146/129. Repeat BP 81/62. Pt states she is having a headache, blurry vision, right upper quadrant pain, shortness of breath and chest pressure. CNM called and came to see patient. Patient's BP rising on its own to 117/72 and symptoms decreasing. BP's cycling every 20 minutes. Patient encouraged to call RN if anything changes. Will continue to monitor.

## 2020-12-26 NOTE — Progress Notes (Signed)
Back to room Post NST.

## 2020-12-26 NOTE — Progress Notes (Signed)
Daily Antepartum Note  Admission Date: 12/20/2020 Current Date: 12/26/2020 9:15 AM  Kristina Mccann is a 30 y.o. G2P1001 @ [redacted]w[redacted]d by LMP consistent with 14 week [place], HD#6, admitted for elevated blood pressure in third trimester - now with cHTN with superposed preeclampsia.  Pregnancy complicated by:  Patient Active Problem List   Diagnosis Date Noted  . [redacted] weeks gestation of pregnancy   . Chronic hypertension with superimposed preeclampsia 12/22/2020  . Elevated blood pressure affecting pregnancy in third trimester, antepartum 12/20/2020  . Pregnancy with history of cesarean section, antepartum 12/12/2020  . Marijuana user 09/18/2020  . Maternal morbid obesity in third trimester, antepartum (HCC) 09/08/2020  . BMI 40.0-44.9, adult (HCC) 09/08/2020  . Supervision of high risk pregnancy, antepartum 09/02/2020  . Lives in sheltered housing 04/04/2018  . 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 B12 deficiency 03/22/2016  . Vitamin D deficiency 03/22/2016  . Acanthosis nigricans 04/11/2015  . Major depression, chronic (HCC) 04/11/2015  . Gastro-esophageal reflux disease without esophagitis 04/11/2015  . Anterior knee pain 04/11/2015  . H/O suicide attempt 04/11/2015  . Migraine without aura and responsive to treatment 04/11/2015  . Seasonal allergic rhinitis 04/11/2015    Overnight/24hr events:  Elevated blood pressures noted overnight. Normotensive on rechecks.   Subjective:  Patient is resting comfortably in bed, eating breakfast. She denies any questions or concerns today. She denies headache, RUQ pain , SOB, or edema. She reports +FM, denies ctx, LOF, or VB today.  Objective:   Vitals:   12/26/20 1300 12/26/20 1320  BP: 136/84 128/88  Pulse:    Resp:    Temp:    SpO2:     Temp:  [98 F (36.7 C)-98.3 F (36.8 C)] 98.1 F (36.7 C) (05/16 1129) Pulse Rate:  [89-105] 104 (05/16 1209) Resp:  [18-22] 18 (05/16 1129) BP: (81-161)/(62-129) 128/88 (05/16 1320) SpO2:  [98 %-100 %] 99  % (05/16 1129) Weight:  [99.8 kg] 99.8 kg (05/16 0628) Temp (24hrs), Avg:98.2 F (36.8 C), Min:98 F (36.7 C), Max:98.3 F (36.8 C)   Intake/Output Summary (Last 24 hours) at 12/26/2020 1436 Last data filed at 12/26/2020 1018 Gross per 24 hour  Intake 360 ml  Output --  Net 360 ml     Current Vital Signs 24h Vital Sign Ranges  T 98.1 F (36.7 C) Temp  Avg: 98.2 F (36.8 C)  Min: 98 F (36.7 C)  Max: 98.3 F (36.8 C)  BP 128/88 BP  Min: 81/62  Max: 161/100  HR (!) 104 Pulse  Avg: 99.1  Min: 89  Max: 105  RR 18 Resp  Avg: 19.7  Min: 18  Max: 22  SaO2 99 %  (room air) SpO2  Avg: 99.3 %  Min: 98 %  Max: 100 %       24 Hour I/O Current Shift I/O  Time Ins Outs 05/15 0701 - 05/16 0700 In: -  Out: 600 [Urine:600] 05/16 0701 - 05/16 1900 In: 360 [P.O.:360] Out: -    Patient Vitals for the past 24 hrs:  BP Temp Temp src Pulse Resp SpO2 Weight  12/26/20 1320 128/88 -- -- -- -- -- --  12/26/20 1300 136/84 -- -- -- -- -- --  12/26/20 1240 126/90 -- -- -- -- -- --  12/26/20 1225 115/90 -- -- -- -- -- --  12/26/20 1221 117/72 -- -- -- -- -- --  12/26/20 1215 112/75 -- -- -- -- -- --  12/26/20 1209 (!) 81/62 -- -- (!) 104 -- -- --  12/26/20 1200 (!) 146/129 -- -- -- -- -- --  12/26/20 1129 (!) 151/90 98.1 F (36.7 C) -- (!) 104 18 99 % --  12/26/20 1045 106/83 -- -- -- -- -- --  12/26/20 1000 131/79 -- -- -- -- -- --  12/26/20 0915 132/85 -- -- -- -- -- --  12/26/20 0805 129/87 98.3 F (36.8 C) Oral (!) 105 20 100 % --  12/26/20 0628 -- -- -- -- -- -- 99.8 kg  12/26/20 0310 113/81 98 F (36.7 C) Oral 89 20 98 % --  12/26/20 0300 129/79 -- -- -- -- -- --  12/26/20 0200 134/77 -- -- -- -- -- --  12/26/20 0100 127/89 -- -- -- -- -- --  12/26/20 0026 121/88 -- -- -- -- -- --  12/25/20 2339 (!) 137/95 -- -- 91 -- -- --  12/25/20 2323 (!) 150/105 98.3 F (36.8 C) Oral 94 20 100 % --  12/25/20 2300 132/84 -- -- -- -- -- --  12/25/20 2208 130/87 -- -- -- -- -- --  12/25/20  2200 (!) 161/100 -- -- -- -- -- --  12/25/20 2119 133/84 -- -- -- -- -- --  12/25/20 2000 (!) 145/97 -- -- -- -- -- --  12/25/20 1916 (!) 146/82 98.3 F (36.8 C) Oral (!) 102 (!) 22 99 % --  12/25/20 1900 (!) 141/73 -- -- -- -- -- --  12/25/20 1800 (!) 155/102 -- -- -- -- -- --  12/25/20 1700 (!) 145/96 -- -- -- -- -- --  12/25/20 1612 131/78 98.3 F (36.8 C) Oral (!) 104 18 100 % --    Physical exam: General: Well nourished, well developed female in no acute distress. Abdomen: gravid, consistent with 30 week dates - non-tender Cardiovascular: S1, S2 normal, no murmur, rub or gallop, regular rate and rhythm Respiratory: CTAB Extremities: no clubbing, cyanosis or edema Skin: Warm and dry.   Medications: Current Facility-Administered Medications  Medication Dose Route Frequency Provider Last Rate Last Admin  . acetaminophen (TYLENOL) tablet 1,000 mg  1,000 mg Oral Q6H PRN Vena Austria, MD   1,000 mg at 12/25/20 2118  . calcium carbonate (TUMS - dosed in mg elemental calcium) chewable tablet 400 mg of elemental calcium  2 tablet Oral TID PRN Zipporah Plants, CNM   400 mg of elemental calcium at 12/26/20 0029  . calcium carbonate (TUMS - dosed in mg elemental calcium) chewable tablet 400 mg of elemental calcium  400 mg of elemental calcium Oral BID Tresea Mall, CNM   400 mg of elemental calcium at 12/26/20 1009  . labetalol (NORMODYNE) injection 20 mg  20 mg Intravenous PRN Vena Austria, MD   20 mg at 12/22/20 8676   And  . labetalol (NORMODYNE) injection 40 mg  40 mg Intravenous PRN Vena Austria, MD   40 mg at 12/20/20 1534   And  . labetalol (NORMODYNE) injection 80 mg  80 mg Intravenous PRN Vena Austria, MD       And  . hydrALAZINE (APRESOLINE) injection 10 mg  10 mg Intravenous PRN Vena Austria, MD      . lactated ringers infusion   Intravenous Continuous Natale Milch, MD 75 mL/hr at 12/24/20 2229 Infusion Verify at 12/24/20 2229  . NIFEdipine  (PROCARDIA-XL/NIFEDICAL-XL) 24 hr tablet 30 mg  30 mg Oral Daily Schuman, Christanna R, MD   30 mg at 12/26/20 1009    Labs:  Recent Labs  Lab 12/23/20 1950 12/24/20 0542 12/26/20 1240  WBC 11.5* 12.4* 10.2  HGB 11.1* 11.7* 12.0  HCT 34.0* 35.4* 35.6*  PLT 369 364 383    Recent Labs  Lab 12/23/20 0928 12/24/20 0542 12/26/20 1240  NA 133* 134* 134*  K 3.7 3.8 4.1  CL 100 101 102  CO2 23 24 24   BUN 9 11 15   CREATININE 0.55 0.57 0.77  CALCIUM 9.1 9.4 9.1  PROT 7.8 7.8 7.4  BILITOT 0.4 0.4 0.3  ALKPHOS 73 75 74  ALT 9 8 7   AST 16 15 13*  GLUCOSE 87 95 101*     Assessment & Plan:   30 yo, G2P1001 [redacted]w[redacted]d on hospital day #6 - admission for cHTN with superimposed preeclampsia  1) Superimposed preeclampsia - labile blood pressures - will monitor response to 30 mg procardia XL today  - order placed for MFM consult - would appreciated formal recs regarding plan of care  2) Fetal well-being - NSTs qshift  3) Disposition - continue current care, discuss plan with MFM - reviewed with Dr. , CNM [facility], [facility] 12/26/2020 9:15 AM

## 2020-12-27 ENCOUNTER — Inpatient Hospital Stay: Payer: Medicaid Other

## 2020-12-27 DIAGNOSIS — Z3A30 30 weeks gestation of pregnancy: Secondary | ICD-10-CM

## 2020-12-27 DIAGNOSIS — Z3A31 31 weeks gestation of pregnancy: Secondary | ICD-10-CM | POA: Diagnosis not present

## 2020-12-27 DIAGNOSIS — O113 Pre-existing hypertension with pre-eclampsia, third trimester: Secondary | ICD-10-CM

## 2020-12-27 DIAGNOSIS — O10013 Pre-existing essential hypertension complicating pregnancy, third trimester: Secondary | ICD-10-CM

## 2020-12-27 DIAGNOSIS — O1493 Unspecified pre-eclampsia, third trimester: Secondary | ICD-10-CM | POA: Diagnosis not present

## 2020-12-27 MED ORDER — LACTATED RINGERS IV BOLUS
250.0000 mL | Freq: Once | INTRAVENOUS | Status: AC
  Administered 2020-12-27: 250 mL via INTRAVENOUS

## 2020-12-27 NOTE — Progress Notes (Signed)
   12/27/20 1530  Clinical Encounter Type  Visited With Patient  Visit Type Initial  Referral From Nurse  Consult/Referral To Chaplain  Spiritual Encounters  Spiritual Needs Emotional  Chaplain Lakeita Panther visited room 345, Pt Kristina Mccann. Pt stated she is about 7 months and she is here because her blood pressure is high. I provided reflective listening and emotional support.

## 2020-12-27 NOTE — Progress Notes (Signed)
   12/27/20 2056  Fetal Heart Rate A  Mode External  Baseline Rate (A) 150 bpm  Variability 6-25 BPM  Accelerations 15 x 15  Decelerations None  Uterine Activity  Mode Toco  Contraction Frequency (min) none    From 20:09 to 20:56, fetal heart tracing is reassuring and Category 1 tracing for patient that is [redacted]w[redacted]d.

## 2020-12-27 NOTE — Progress Notes (Signed)
   Progress Note   30 y.o. G2P1001 @ [redacted]w[redacted]d , admitted for  Pregnancy, Preeclampsia w cHTN, Prior CS  Subjective:  Feels well, occas mild headache, no CP SOB or epigastric pain, no edema  Objective:  BP 137/90   Pulse 100   Temp 98.3 F (36.8 C) (Oral)   Resp 18   Ht 5\' 2"  (1.575 m)   Wt 99.8 kg   LMP 05/29/2020 Comment: approx [redacted] weeks pregnant   SpO2 100%   BMI 40.24 kg/m  Abd: gravid, ND, FHT present, without guarding, without rebound tenderness on exam Extr: trace to 1+ bilateral pedal edema  A NST procedure was performed with FHR monitoring and a normal baseline established, appropriate time of 20-40 minutes of evaluation, and accels >2 seen w 15x15 characteristics.  Results show a REACTIVE NST.   Labs reviewed MFM consult discussed and reviewed  Assessment & Plan:  G2P1001 @ [redacted]w[redacted]d, admitted for cHTN w superimposed preeclampsia She has labile BPs.   Cont Procardia at 30 mg daily Modified bed rest Delivery planned for 34 weeks, to schedule as CS later (around June 12) SCDs. Diet.   All discussed with patient, see orders  June 14, MD, [facility], [facility] 12/27/2020  4:50 PM

## 2020-12-27 NOTE — CV Procedure (Signed)
MFM Follow up Consultation   I saw Kristina Mccann who is a 30 yo G2P1 admitted for chronic hypertension exacerbation. She is seen at the request [facility] CNM.  Kristina Mccann was admitted on 5/10 for a exacerbation of chronic hypertension. Her chronic hypertension diagnosis was established in January where she has elevated blood pressure of 130/90 mmHg. Her blood pressure since then was labile. However, on 5/10 she was admitted with blood pressures 144/88 but that ranged as high as 170/117 mmHg. She oral Procardia was titrated up to 90 mg per day, but then subsequently developed hypotenstion with BP 80's/40's mmHg. She is now at Procardia 30 mg XL daily since that time her blood pressure have ranged from 120-140's/ 80-90's. She did receive IV Labetalol and hydralizine to control her blood pressure.    Her CBC and CMP are normal. On  5/12 a 24 hour urine was 1032 mg. Giving her the diagnosis of superimposed preeclampsia with severe features by blood pressure criteria.  Vitals with BMI 12/27/2020 12/27/2020 12/27/2020  Height - - -  Weight - - -  BMI - - -  Systolic 144 117 213  Diastolic 102 76 120  Pulse - - -   CMP Latest Ref Rng & Units 12/26/2020 12/24/2020 12/23/2020  Glucose 70 - 99 mg/dL 086(V) 95 87  BUN 6 - 20 mg/dL 15 11 9   Creatinine 0.44 - 1.00 mg/dL 7.84 6.96  Sodium 135 - 145 mmol/L 134(L) 134(L) 133(L)  Potassium 3.5 - 5.1 mmol/L 4.1 3.8 3.7  Chloride 98 - 111 mmol/L 102 101 100  CO2 22 - 32 mmol/L 24 24 23   Calcium 8.9 - 10.3 mg/dL 9.1 9.4 9.1  Total Protein 6.5 - 8.1 g/dL 7.4 7.8 7.8  Total Bilirubin 0.3 - 1.2 mg/dL 0.3 0.4 0.4  Alkaline Phos 38 - 126 U/L 74 75 73  AST 15 - 41 U/L 13(L) 15 16  ALT 0 - 44 U/L 7 8 9    CBC Latest Ref Rng & Units 12/26/2020 12/24/2020 12/23/2020  WBC 4.0 - 10.5 K/uL 10.2 12.4(H) 11.5(H)  Hemoglobin 12.0 - 15.0 g/dL 12/28/2020 11.7(L) 11.1(L)  Hematocrit 36.0 - 46.0 % 35.6(L) 35.4(L) 34.0(L)  Platelets 150 - 400 K/uL 383 364 369   Her most recent  growth was on 05/09 EFW 36th% 1315 g    Impression/counseling:  I discussed with Kristina Mccann that her blood pressures has been labile and that her elevated blood pressure and the subsequent drop is likely related to true chronic hypertension exacerbation with dehydration/vasal vagal response.  It is possible that hypertensive therapy was needed prior to this admission, however given her blood pressure readings > 170/110 mmHg requiring IV therapy she has the diagnosis of superimposed preeclampsia with severe features.   Mothers with diagnosis are at increased risk of stroke, placental abruption, fetal growth restriction, seizures and stillbirth. Therefore we recommend Kristina Mccann remain her hospitalized until 34 weeks.  Maintaining her blood pressure < 150/105 but greater that 120/70 mmHg.  We recommend daily NST testing, 2x weekly BPP and weekly labs including CBC and CMP. UPC is no longer need at his time. If labs become abnormal either decreased platelets or LFT's repeat in 4-6 hrs.  Indications for delivery include poor fetal testing, worsening blood pressure unresolved with IV therapy (persist >160/110 mmHg), abnormal labs and oligohydramnios, persistent headache unresolved with tylenol,   Consider repeat course of BMZ if delivery before 34 weeks. ( last given 05/10 and 5/11)  Consider Mg  for neuroprotection prior to 32 week delivery.  All questions answered. I reviewed the plan of care with Paula Compton.  I spent 30 minutes with > 50% in face to face consultation.  Novella Olive, MD.

## 2020-12-28 ENCOUNTER — Inpatient Hospital Stay: Payer: Medicaid Other

## 2020-12-28 DIAGNOSIS — O10013 Pre-existing essential hypertension complicating pregnancy, third trimester: Secondary | ICD-10-CM | POA: Diagnosis not present

## 2020-12-28 DIAGNOSIS — Z3A30 30 weeks gestation of pregnancy: Secondary | ICD-10-CM | POA: Diagnosis not present

## 2020-12-28 DIAGNOSIS — O113 Pre-existing hypertension with pre-eclampsia, third trimester: Secondary | ICD-10-CM | POA: Diagnosis not present

## 2020-12-28 LAB — COMPREHENSIVE METABOLIC PANEL
ALT: 6 U/L (ref 0–44)
AST: 16 U/L (ref 15–41)
Albumin: 3 g/dL — ABNORMAL LOW (ref 3.5–5.0)
Alkaline Phosphatase: 83 U/L (ref 38–126)
Anion gap: 10 (ref 5–15)
BUN: 14 mg/dL (ref 6–20)
CO2: 20 mmol/L — ABNORMAL LOW (ref 22–32)
Calcium: 8.8 mg/dL — ABNORMAL LOW (ref 8.9–10.3)
Chloride: 102 mmol/L (ref 98–111)
Creatinine, Ser: 0.59 mg/dL (ref 0.44–1.00)
GFR, Estimated: >60 mL/min (ref >60)
Glucose, Bld: 89 mg/dL (ref 70–99)
Potassium: 4.2 mmol/L (ref 3.5–5.1)
Sodium: 132 mmol/L — ABNORMAL LOW (ref 135–145)
Total Bilirubin: 0.4 mg/dL (ref 0.3–1.2)
Total Protein: 7.6 g/dL (ref 6.5–8.1)

## 2020-12-28 MED ORDER — ASPIRIN 81 MG PO CHEW
81.0000 mg | CHEWABLE_TABLET | Freq: Every day | ORAL | Status: DC
  Administered 2020-12-28 – 2021-01-10 (×14): 81 mg via ORAL
  Filled 2020-12-28 (×14): qty 1

## 2020-12-28 NOTE — Progress Notes (Signed)
NST completed. Category 1 tracing. No patient complaints at this time. Denies LOF, bleeding or contractions. +FM. CNM M. Eunice Blase to be notified of tracing/NST result to be reviewd

## 2020-12-28 NOTE — Progress Notes (Signed)
NST reviewed by Paula Compton CNM

## 2020-12-28 NOTE — Progress Notes (Signed)
Pt brought to L&D for Qshift NST. Monitors applied at 1148. No complaints at this time. +FM. Denies LOF/bleeding/ctx.

## 2020-12-28 NOTE — Progress Notes (Signed)
Subjective:  Kristina Mccann is a 30 year old gravida 2 Para 1, now 30 week 3 days gestation of pregnancy.  She is admitted to the Mother- Baby unit for a dx of Chronic hypertension with superimposed pre- eclampsia.  Kristina Mccann is feeling well today; denies any headache, visual changes or chest pain, shortness of breath. Her baby is moving well. She verbalizes understanding regarding the POC for her to remain in- patient until delivery at 34 weeks or her superimposed pre- eclampsia exacerbates.. Voiding without difficulty. Tolerating a regular diet. Ambulating well. SCD hose in use when not ambulating.  Objective:   Blood pressure 137/84, pulse 100, temperature 98.9 F (37.2 C), temperature source Oral, resp. rate 18, height 5\' 2"  (1.575 m), weight 99.8 kg, last menstrual period 05/29/2020, SpO2 99 %. NST: 145-150 bpm baseline, moderate variability, present accelerations, no decelerations. Tocometer : no contractions noted  General: NAD Pulmonary: no increased work of breathing; lungs CTA bilareally Abdomen: non-distended, non-tender, + bowel sounds  Extremities: no edema, no erythema, no tenderness, no signs of DVT  Results for orders placed or performed during the hospital encounter of 12/20/20 (from the past 72 hour(s))  Comprehensive metabolic panel     Status: Abnormal   Collection Time: 12/26/20 12:40 PM  Result Value Ref Range   Sodium 134 (L) 135 - 145 mmol/L   Potassium 4.1 3.5 - 5.1 mmol/L   Chloride 102 98 - 111 mmol/L   CO2 24 22 - 32 mmol/L   Glucose, Bld 101 (H) 70 - 99 mg/dL    Comment: Glucose reference range applies only to samples taken after fasting for at least 8 hours.   BUN 15 6 - 20 mg/dL   Creatinine, Ser 12/28/20 0.44 - 1.00 mg/dL   Calcium 9.1 8.9 - 9.98 mg/dL   Total Protein 7.4 6.5 - 8.1 g/dL   Albumin 2.9 (L) 3.5 - 5.0 g/dL   AST 13 (L) 15 - 41 U/L   ALT 7 0 - 44 U/L   Alkaline Phosphatase 74 38 - 126 U/L   Total Bilirubin 0.3 0.3 - 1.2 mg/dL   GFR, Estimated 33.8  >25 mL/min    Comment: (NOTE) Calculated using the CKD-EPI Creatinine Equation (2021)    Anion gap 8 5 - 15    Comment: Performed at [facility], [address] [place], [place]  CBC     Status: Abnormal   Collection Time: 12/26/20 12:40 PM  Result Value Ref Range   WBC 10.2 4.0 - 10.5 K/uL   RBC 4.16 3.87 - 5.11 MIL/uL   Hemoglobin 12.0 12.0 - 15.0 g/dL   HCT 12/28/20 (L) 34.1 - 93.7 %   MCV 85.6 80.0 - 100.0 fL   MCH 28.8 26.0 - 34.0 pg   MCHC 33.7 30.0 - 36.0 g/dL   RDW 90.2 40.9 - 73.5 %   Platelets 383 150 - 400 K/uL   nRBC 0.0 0.0 - 0.2 %    Comment: Performed at [facility], [address] [place], [place]  CMP     Status: Abnormal   Collection Time: 12/28/20 10:48 AM  Result Value Ref Range   Sodium 132 (L) 135 - 145 mmol/L   Potassium 4.2 3.5 - 5.1 mmol/L   Chloride 102 98 - 111 mmol/L   CO2 20 (L) 22 - 32 mmol/L   Glucose, Bld 89 70 - 99 mg/dL    Comment: Glucose reference range applies only to samples taken after fasting  for at least 8 hours.   BUN 14 6 - 20 mg/dL   Creatinine, Ser 1.61 0.44 - 1.00 mg/dL   Calcium 8.8 (L) 8.9 - 10.3 mg/dL   Total Protein 7.6 6.5 - 8.1 g/dL   Albumin 3.0 (L) 3.5 - 5.0 g/dL   AST 16 15 - 41 U/L   ALT 6 0 - 44 U/L   Alkaline Phosphatase 83 38 - 126 U/L   Total Bilirubin 0.4 0.3 - 1.2 mg/dL   GFR, Estimated >09 >60 mL/min    Comment: (NOTE) Calculated using the CKD-EPI Creatinine Equation (2021)    Anion gap 10 5 - 15    Comment: Performed at [facility], [address] [place], [place] [address]   BPP yesterday shows 6/8 score  ( 2 off for the amniotic fluid) Assessment:   30 y.o. G2P1001 at 30 weeks 3 days gestation, admitted with superimposed pre eclampsia, chronic hypertension. Reactive NST this morning. Continues on daily Procardia, close monitoring of blood pressures and lab results. ALT stable.    Plan:   Plan of care developed with MFM to include daily NSTs,  BPPs and AFI q 3 days. Procardia 30 mg po daily. SCD hose Daily ASA ordered Daily CMPs NSTs daily, q day.    Mirna Mires, CNM  12/28/2020 3:14 PM   [facility], [facility] 12/28/2020 2:50 PM

## 2020-12-29 DIAGNOSIS — O10013 Pre-existing essential hypertension complicating pregnancy, third trimester: Secondary | ICD-10-CM | POA: Diagnosis not present

## 2020-12-29 DIAGNOSIS — Z3A29 29 weeks gestation of pregnancy: Secondary | ICD-10-CM | POA: Diagnosis not present

## 2020-12-29 DIAGNOSIS — O113 Pre-existing hypertension with pre-eclampsia, third trimester: Secondary | ICD-10-CM | POA: Diagnosis not present

## 2020-12-29 LAB — COMPREHENSIVE METABOLIC PANEL
ALT: 8 U/L (ref 0–44)
AST: 14 U/L — ABNORMAL LOW (ref 15–41)
Albumin: 2.9 g/dL — ABNORMAL LOW (ref 3.5–5.0)
Alkaline Phosphatase: 81 U/L (ref 38–126)
Anion gap: 8 (ref 5–15)
BUN: 11 mg/dL (ref 6–20)
CO2: 24 mmol/L (ref 22–32)
Calcium: 9 mg/dL (ref 8.9–10.3)
Chloride: 103 mmol/L (ref 98–111)
Creatinine, Ser: 0.7 mg/dL (ref 0.44–1.00)
GFR, Estimated: >60 mL/min (ref >60)
Glucose, Bld: 100 mg/dL — ABNORMAL HIGH (ref 70–99)
Potassium: 4.1 mmol/L (ref 3.5–5.1)
Sodium: 135 mmol/L (ref 135–145)
Total Bilirubin: 0.4 mg/dL (ref 0.3–1.2)
Total Protein: 7.6 g/dL (ref 6.5–8.1)

## 2020-12-29 MED ORDER — PRENATAL MULTIVITAMIN CH
1.0000 | ORAL_TABLET | Freq: Every day | ORAL | Status: DC
  Administered 2020-12-29 – 2021-01-10 (×13): 1 via ORAL
  Filled 2020-12-29 (×13): qty 1

## 2020-12-29 NOTE — Progress Notes (Signed)
Subjective:  She is feeling well. She denies RUQ pain. She denies headaches. She denies edema. She reports normal fetal movement. She denies vaginal bleeding, she denies contractions.   Objective:   Blood pressure 113/78, pulse 95, temperature 98.4 F (36.9 C), temperature source Oral, resp. rate 18, height 5\' 2"  (1.575 m), weight 99.5 kg, last menstrual period 05/29/2020, SpO2 100 %.  General: NAD Pulmonary: no increased work of breathing Abdomen: non-distended, non-tender Uterus: gravid Extremities: no edema, no erythema, no tenderness, no signs of DVT  Results for orders placed or performed during the hospital encounter of 12/20/20 (from the past 72 hour(s))  Comprehensive metabolic panel     Status: Abnormal   Collection Time: 12/26/20 12:40 PM  Result Value Ref Range   Sodium 134 (L) 135 - 145 mmol/L   Potassium 4.1 3.5 - 5.1 mmol/L   Chloride 102 98 - 111 mmol/L   CO2 24 22 - 32 mmol/L   Glucose, Bld 101 (H) 70 - 99 mg/dL    Comment: Glucose reference range applies only to samples taken after fasting for at least 8 hours.   BUN 15 6 - 20 mg/dL   Creatinine, Ser 12/28/20 0.44 - 1.00 mg/dL   Calcium 9.1 8.9 - 2.63 mg/dL   Total Protein 7.4 6.5 - 8.1 g/dL   Albumin 2.9 (L) 3.5 - 5.0 g/dL   AST 13 (L) 15 - 41 U/L   ALT 7 0 - 44 U/L   Alkaline Phosphatase 74 38 - 126 U/L   Total Bilirubin 0.3 0.3 - 1.2 mg/dL   GFR, Estimated 78.5 >88 mL/min    Comment: (NOTE) Calculated using the CKD-EPI Creatinine Equation (2021)    Anion gap 8 5 - 15    Comment: Performed at [facility], [address] [place], [place]  CBC     Status: Abnormal   Collection Time: 12/26/20 12:40 PM  Result Value Ref Range   WBC 10.2 4.0 - 10.5 K/uL   RBC 4.16 3.87 - 5.11 MIL/uL   Hemoglobin 12.0 12.0 - 15.0 g/dL   HCT 12/28/20 (L) 28.7 - 86.7 %   MCV 85.6 80.0 - 100.0 fL   MCH 28.8 26.0 - 34.0 pg   MCHC 33.7 30.0 - 36.0 g/dL   RDW 67.2 09.4 - 70.9 %   Platelets 383 150 - 400 K/uL    nRBC 0.0 0.0 - 0.2 %    Comment: Performed at [facility], [address] [place], [place]  CMP     Status: Abnormal   Collection Time: 12/28/20 10:48 AM  Result Value Ref Range   Sodium 132 (L) 135 - 145 mmol/L   Potassium 4.2 3.5 - 5.1 mmol/L   Chloride 102 98 - 111 mmol/L   CO2 20 (L) 22 - 32 mmol/L   Glucose, Bld 89 70 - 99 mg/dL    Comment: Glucose reference range applies only to samples taken after fasting for at least 8 hours.   BUN 14 6 - 20 mg/dL   Creatinine, Ser 12/30/20 0.44 - 1.00 mg/dL   Calcium 8.8 (L) 8.9 - 10.3 mg/dL   Total Protein 7.6 6.5 - 8.1 g/dL   Albumin 3.0 (L) 3.5 - 5.0 g/dL   AST 16 15 - 41 U/L   ALT 6 0 - 44 U/L   Alkaline Phosphatase 83 38 - 126 U/L   Total Bilirubin 0.4 0.3 - 1.2 mg/dL   GFR, Estimated 4.76 >54 mL/min  Comment: (NOTE) Calculated using the CKD-EPI Creatinine Equation (2021)    Anion gap 10 5 - 15    Comment: Performed at [facility], [address] [place], [place] [address]  CMP     Status: Abnormal   Collection Time: 12/29/20  6:02 AM  Result Value Ref Range   Sodium 135 135 - 145 mmol/L   Potassium 4.1 3.5 - 5.1 mmol/L   Chloride 103 98 - 111 mmol/L   CO2 24 22 - 32 mmol/L   Glucose, Bld 100 (H) 70 - 99 mg/dL    Comment: Glucose reference range applies only to samples taken after fasting for at least 8 hours.   BUN 11 6 - 20 mg/dL   Creatinine, Ser 4.97 0.44 - 1.00 mg/dL   Calcium 9.0 8.9 - 02.6 mg/dL   Total Protein 7.6 6.5 - 8.1 g/dL   Albumin 2.9 (L) 3.5 - 5.0 g/dL   AST 14 (L) 15 - 41 U/L   ALT 8 0 - 44 U/L   Alkaline Phosphatase 81 38 - 126 U/L   Total Bilirubin 0.4 0.3 - 1.2 mg/dL   GFR, Estimated >37 >85 mL/min    Comment: (NOTE) Calculated using the CKD-EPI Creatinine Equation (2021)    Anion gap 8 5 - 15    Comment: Performed at [facility], [address], [place], [place] [address]    Assessment:   30 y.o. G2P1001 hospital day # 10  G2P1001 [redacted]w[redacted]d with CHTN and  superimposed preeclampsia  Plan:   1)Continue care preeclampsia. Procardia to 30 mg. Weekly CBC and CMP 2) Regular diet 3) NSTs daily , twice a week BPP   Adelene Idler MD [facility], [facility] 12/29/2020 10:02 AM

## 2020-12-29 NOTE — Progress Notes (Signed)
   12/29/20 1400  Pain Assessment  Pain Score 4  Pain Location Head  Pain Descriptors / Indicators Aching  Pain Frequency Constant  Pain Onset Sudden  Pain Intervention(s) Medication (See eMAR);Repositioned;Emotional support;Environmental changes;Relaxation;Rest (offered massage, patient refused for now)  Patients Stated Pain Goal 0  2nd Pain Site  Pain Score 3  Pain Location Abdomen (pt describes pain in pelvic bone and spreading across abdomen and up into her ribs especially on the left side of ribs, more when baby moves. I assessed baby's position and pt. reported soreness when I was doing leopold's maneuveurs.)  Pain Orientation Lower  Pain Descriptors / Indicators Heaviness;Sore  Pain Onset With Activity (with baby activity)  Pain Intervention(s) Repositioned;Cutaneous stimulation;Distraction;Emotional support;Relaxation;Rest  Complaints & Interventions  Complains of Other (Comment) (tingling in toes, overall doesn't "feel well", feels achy in her head and belly)  Neuro symptoms relieved by Relaxation techniques (Comment) (tylenol given, low stimulation environment)  Magnesium Sulfate  Patient on Magnesium Sulfate? No  Reflexes Plus 1  Clonus Absent  Level of Consciousness Alert  Orientation Level Oriented X4  Uterine Activity  Mode Palpation (Patient states as well that she doesn't feel contractions)  Contraction Frequency (min) none  Cervical Exam  Vag. Bleeding None  Membranes  Amount None    Patient called out to report headache, when I came in patient stated that she just wasn't feeling well with a headache, pressure behind her eyes, and achyness and sharp pains in her abdomen and back esp when baby moves. She was slouched down in bed, so I helped her move to be in a more upright position and explained how baby is growing and putting pressure on the pelvic symphasis as relaxin is released at this stage in pregnancy. I gave tylenol as ordered. Pt. States baby is  moving as normal, she denies vaginal bleeding or leaking of fluids or contractions. BP is slightly elevated but not in acute treatment range. See flowsheets. Scheduled procardia given. Plan to recheck on patient in half hour for hopeful improvement of pain and report findings to CNM at that time.

## 2020-12-29 NOTE — Progress Notes (Signed)
   12/29/20 1425  Clinical Encounter Type  Visited With Patient  Visit Type Follow-up  Referral From Patient  Consult/Referral To Nurse  Spiritual Encounters  Spiritual Needs Emotional  Chaplain Oleta Mouse did attempt to do a FU visit with [name] in room 345A, but the nurse states, " I just left out of there and she has a slight headache." I said ok and I peeked my head in and said hello and I will check on you at another time.

## 2020-12-29 NOTE — Progress Notes (Signed)
Pt arrival to unit for scheduled NST. Monitors applied and assessing- FHT 145.  Pt denies bleeding or LOF and reports positive fetal movement.

## 2020-12-29 NOTE — Progress Notes (Signed)
Monitors removed-NST reactive. MD to review. Pt transported back to [facility].

## 2020-12-30 ENCOUNTER — Inpatient Hospital Stay: Payer: Medicaid Other

## 2020-12-30 DIAGNOSIS — Z3A30 30 weeks gestation of pregnancy: Secondary | ICD-10-CM | POA: Diagnosis not present

## 2020-12-30 DIAGNOSIS — O10013 Pre-existing essential hypertension complicating pregnancy, third trimester: Secondary | ICD-10-CM | POA: Diagnosis not present

## 2020-12-30 DIAGNOSIS — Z3A31 31 weeks gestation of pregnancy: Secondary | ICD-10-CM | POA: Diagnosis not present

## 2020-12-30 DIAGNOSIS — O1493 Unspecified pre-eclampsia, third trimester: Secondary | ICD-10-CM | POA: Diagnosis not present

## 2020-12-30 DIAGNOSIS — O113 Pre-existing hypertension with pre-eclampsia, third trimester: Secondary | ICD-10-CM | POA: Diagnosis not present

## 2020-12-30 DIAGNOSIS — Z3A29 29 weeks gestation of pregnancy: Secondary | ICD-10-CM | POA: Diagnosis not present

## 2020-12-30 LAB — COMPREHENSIVE METABOLIC PANEL
ALT: 9 U/L (ref 0–44)
AST: 18 U/L (ref 15–41)
Albumin: 2.9 g/dL — ABNORMAL LOW (ref 3.5–5.0)
Alkaline Phosphatase: 82 U/L (ref 38–126)
Anion gap: 9 (ref 5–15)
BUN: 10 mg/dL (ref 6–20)
CO2: 20 mmol/L — ABNORMAL LOW (ref 22–32)
Calcium: 8.9 mg/dL (ref 8.9–10.3)
Chloride: 103 mmol/L (ref 98–111)
Creatinine, Ser: 0.53 mg/dL (ref 0.44–1.00)
GFR, Estimated: >60 mL/min (ref >60)
Glucose, Bld: 93 mg/dL (ref 70–99)
Potassium: 4.1 mmol/L (ref 3.5–5.1)
Sodium: 132 mmol/L — ABNORMAL LOW (ref 135–145)
Total Bilirubin: 0.4 mg/dL (ref 0.3–1.2)
Total Protein: 7.5 g/dL (ref 6.5–8.1)

## 2020-12-30 NOTE — Progress Notes (Signed)
Patient with reactive NST and [place], CNM reviewed FHR tracing. Initial BP at 1626 was 146/90 and repeat at 1641 was 175/100. BP at 1656 was 151/103. Patient denies headache, blurry vision and seeing spots. Patient rates pain 0/10. Order given to take patient back to mother baby unit.

## 2020-12-30 NOTE — Progress Notes (Signed)
RN reported NST and BPs to mother baby nurse. Patient being transported to mother baby unit.

## 2020-12-30 NOTE — Progress Notes (Signed)
Notified Kristina Mccann, CNM of elevated BP 141/101. Not win severe range, no interventions at this time. Pt is asymptomatic. Denies HA, vision changes or SOB. Pt recently showered and is now eating. Will recheck BP after patient has rest.

## 2020-12-30 NOTE — Progress Notes (Signed)
Patient transported to OBS2 for NST via wheelchair.

## 2020-12-30 NOTE — Progress Notes (Addendum)
Subjective:  Patient is resting in bed. She says she is tired. She reports good fetal movement. She denies contractions, leakage of fluid, vaginal bleeding, headache, visual changes, epigastric pain.   Objective:   BP (!) 142/87 (BP Location: Left Arm) Comment: nurse Kim A. notified  Pulse (!) 110 Comment: nurse Kim A. notified  Temp 97.7 F (36.5 C) (Oral)   Resp 20   Ht 5\' 2"  (1.575 m)   Wt 100.1 kg   LMP 05/29/2020 Comment: approx [redacted] weeks pregnant   SpO2 99%   BMI 40.35 kg/m    General: NAD Pulmonary: no increased work of breathing Abdomen: non-distended, non-tender Uterus: gravid Extremities: no edema, no erythema, no tenderness, no signs of DVT   CLINICAL DATA:  Preeclampsia.  Assess amniotic fluid  EXAM: LIMITED OBSTETRIC ULTRASOUND AND BIOPHYSICAL PROFILE  COMPARISON:  12/27/2020  FINDINGS: Number of Fetuses: 1  Heart Rate:  146 bpm  Movement: Yes  Presentation: Cephalic  Placental Location: Anterior  Previa: No  Amniotic Fluid (Subjective):  Low-normal.  AFI: 9.3 cm (previously 7.7 on 12/27/2020)  BPD:  7.4cm 29w 3d  MATERNAL FINDINGS:  Cervix:  Appears closed.  Measuring approximately 3.8 cm.  Uterus/Adnexae: No abnormality visualized.  Movement:  2  Time: 16 minutes  Breathing: 2  Tone:  2  Amniotic Fluid: 2  Total Score:  8  IMPRESSION: 1. Single live intrauterine gestation in cephalic presentation. 2. Biophysical profile score is 8 out of 8. 3. Amniotic fluid index of 9.3 cm (previously measured 7.7 cm on 12/27/2020), measuring just above the 5th percentile for gestational age.   Results for orders placed or performed during the hospital encounter of 12/20/20 (from the past 72 hour(s))  CMP     Status: Abnormal   Collection Time: 12/28/20 10:48 AM  Result Value Ref Range   Sodium 132 (L) 135 - 145 mmol/L   Potassium 4.2 3.5 - 5.1 mmol/L   Chloride 102 98 - 111 mmol/L   CO2 20 (L) 22 - 32 mmol/L    Glucose, Bld 89 70 - 99 mg/dL    Comment: Glucose reference range applies only to samples taken after fasting for at least 8 hours.   BUN 14 6 - 20 mg/dL   Creatinine, Ser 12/30/20 0.44 - 1.00 mg/dL   Calcium 8.8 (L) 8.9 - 10.3 mg/dL   Total Protein 7.6 6.5 - 8.1 g/dL   Albumin 3.0 (L) 3.5 - 5.0 g/dL   AST 16 15 - 41 U/L   ALT 6 0 - 44 U/L   Alkaline Phosphatase 83 38 - 126 U/L   Total Bilirubin 0.4 0.3 - 1.2 mg/dL   GFR, Estimated 9.51 >88 mL/min    Comment: (NOTE) Calculated using the CKD-EPI Creatinine Equation (2021)    Anion gap 10 5 - 15    Comment: Performed at [facility], [address] [place], [place]  CMP     Status: Abnormal   Collection Time: 12/29/20  6:02 AM  Result Value Ref Range   Sodium 135 135 - 145 mmol/L   Potassium 4.1 3.5 - 5.1 mmol/L   Chloride 103 98 - 111 mmol/L   CO2 24 22 - 32 mmol/L   Glucose, Bld 100 (H) 70 - 99 mg/dL    Comment: Glucose reference range applies only to samples taken after fasting for at least 8 hours.   BUN 11 6 - 20 mg/dL   Creatinine, Ser 12/31/20 0.44 - 1.00 mg/dL   Calcium  9.0 8.9 - 10.3 mg/dL   Total Protein 7.6 6.5 - 8.1 g/dL   Albumin 2.9 (L) 3.5 - 5.0 g/dL   AST 14 (L) 15 - 41 U/L   ALT 8 0 - 44 U/L   Alkaline Phosphatase 81 38 - 126 U/L   Total Bilirubin 0.4 0.3 - 1.2 mg/dL   GFR, Estimated >70 >26 mL/min    Comment: (NOTE) Calculated using the CKD-EPI Creatinine Equation (2021)    Anion gap 8 5 - 15    Comment: Performed at [facility], [address] [place], [place] [address]  CMP     Status: Abnormal   Collection Time: 12/30/20  7:02 AM  Result Value Ref Range   Sodium 132 (L) 135 - 145 mmol/L   Potassium 4.1 3.5 - 5.1 mmol/L   Chloride 103 98 - 111 mmol/L   CO2 20 (L) 22 - 32 mmol/L   Glucose, Bld 93 70 - 99 mg/dL    Comment: Glucose reference range applies only to samples taken after fasting for at least 8 hours.   BUN 10 6 - 20 mg/dL   Creatinine, Ser 8.50 0.44 - 1.00 mg/dL    Calcium 8.9 8.9 - 27.7 mg/dL   Total Protein 7.5 6.5 - 8.1 g/dL   Albumin 2.9 (L) 3.5 - 5.0 g/dL   AST 18 15 - 41 U/L   ALT 9 0 - 44 U/L   Alkaline Phosphatase 82 38 - 126 U/L   Total Bilirubin 0.4 0.3 - 1.2 mg/dL   GFR, Estimated >41 >28 mL/min    Comment: (NOTE) Calculated using the CKD-EPI Creatinine Equation (2021)    Anion gap 9 5 - 15    Comment: Performed at [facility], [address] [place], [place] [address]   Total time spent in the care and management of this patient 15 minutes Assessment:   30 y.o. G2P1001 hospital day # 11  G2P1001 [redacted]w[redacted]d with CHTN and superimposed preeclampsia  Plan:   1)Continue care preeclampsia. Procardia 30 mg. Weekly CBC and CMP 2) Regular diet 3) NSTs daily , twice a week BPP   Tresea Mall, CNM [facility] 12/30/2020, 3:24 PM

## 2020-12-31 DIAGNOSIS — O10013 Pre-existing essential hypertension complicating pregnancy, third trimester: Secondary | ICD-10-CM | POA: Diagnosis not present

## 2020-12-31 DIAGNOSIS — O113 Pre-existing hypertension with pre-eclampsia, third trimester: Secondary | ICD-10-CM | POA: Diagnosis not present

## 2020-12-31 DIAGNOSIS — Z3A31 31 weeks gestation of pregnancy: Secondary | ICD-10-CM | POA: Diagnosis not present

## 2020-12-31 LAB — COMPREHENSIVE METABOLIC PANEL
ALT: 10 U/L (ref 0–44)
AST: 14 U/L — ABNORMAL LOW (ref 15–41)
Albumin: 2.7 g/dL — ABNORMAL LOW (ref 3.5–5.0)
Alkaline Phosphatase: 73 U/L (ref 38–126)
Anion gap: 7 (ref 5–15)
BUN: 11 mg/dL (ref 6–20)
CO2: 22 mmol/L (ref 22–32)
Calcium: 9.1 mg/dL (ref 8.9–10.3)
Chloride: 104 mmol/L (ref 98–111)
Creatinine, Ser: 0.69 mg/dL (ref 0.44–1.00)
GFR, Estimated: >60 mL/min (ref >60)
Glucose, Bld: 112 mg/dL — ABNORMAL HIGH (ref 70–99)
Potassium: 4.3 mmol/L (ref 3.5–5.1)
Sodium: 133 mmol/L — ABNORMAL LOW (ref 135–145)
Total Bilirubin: 0.4 mg/dL (ref 0.3–1.2)
Total Protein: 7.1 g/dL (ref 6.5–8.1)

## 2020-12-31 NOTE — Progress Notes (Signed)
Back To room via wheelchair. Ardine Eng RN states NST was reactive.

## 2020-12-31 NOTE — Progress Notes (Signed)
  Chaplain On-Call responded to Order Requisition from [employer].  The request noted that the patient will need to stay in the hospital for approximately six weeks due to crisis pregnancy.  Chaplain met patient and learned about her concerns related to her high blood pressure and the elements of her crisis pregnancy.  Patient also talked about her ten-year-old daughter at home, and the limited visitation that will be possible in the weeks ahead. She described a good family support system to care for her daughter during this hospitalization.  Chaplain provided spiritual and emotional support.  Chaplain Pollyann Samples M.Div., [facility]

## 2020-12-31 NOTE — Progress Notes (Signed)
Obstetric and Gynecology  Subjective  Doing well.  No headaches, vision changes, RUQ/epigastric pain, increased edema.  +FM, no LOF, no VB, no contractions  Objective  Vital signs in last 24 hours: Temp:  [97.7 F (36.5 C)-98.5 F (36.9 C)] 98 F (36.7 C) (05/21 0754) Pulse Rate:  [92-127] 105 (05/21 0754) Resp:  [16-20] 18 (05/21 0754) BP: (113-175)/(79-103) 113/88 (05/21 0754) SpO2:  [98 %-100 %] 98 % (05/21 0754) Weight:  [100.6 kg] 100.6 kg (05/21 0321) Last BM Date: 12/28/20  No intake or output data in the 24 hours ending 12/31/20 0833  General: NAD Pulmonary: no increased work of breathing Abdomen: gravid, non-tender Extremities: SCD in place no edema or tenderness  Labs: Results for orders placed or performed during the hospital encounter of 12/20/20 (from the past 24 hour(s))  CMP     Status: Abnormal   Collection Time: 12/31/20  5:17 AM  Result Value Ref Range   Sodium 133 (L) 135 - 145 mmol/L   Potassium 4.3 3.5 - 5.1 mmol/L   Chloride 104 98 - 111 mmol/L   CO2 22 22 - 32 mmol/L   Glucose, Bld 112 (H) 70 - 99 mg/dL   BUN 11 6 - 20 mg/dL   Creatinine, Ser 7.59 0.44 - 1.00 mg/dL   Calcium 9.1 8.9 - 16.3 mg/dL   Total Protein 7.1 6.5 - 8.1 g/dL   Albumin 2.7 (L) 3.5 - 5.0 g/dL   AST 14 (L) 15 - 41 U/L   ALT 10 0 - 44 U/L   Alkaline Phosphatase 73 38 - 126 U/L   Total Bilirubin 0.4 0.3 - 1.2 mg/dL   GFR, Estimated >84 >66 mL/min   Anion gap 7 5 - 15    Cultures: Results for orders placed or performed during the hospital encounter of 12/20/20  Resp Panel by RT-PCR (Flu A&B, Covid) Nasopharyngeal Swab     Status: None   Collection Time: 12/22/20  5:30 PM   Specimen: Nasopharyngeal Swab; Nasopharyngeal(NP) swabs in vial transport medium  Result Value Ref Range Status   SARS Coronavirus 2 by RT PCR NEGATIVE NEGATIVE Final    Comment: (NOTE) SARS-CoV-2 target nucleic acids are NOT DETECTED.  The SARS-CoV-2 RNA is generally detectable in upper  respiratory specimens during the acute phase of infection. The lowest concentration of SARS-CoV-2 viral copies this assay can detect is 138 copies/mL. A negative result does not preclude SARS-Cov-2 infection and should not be used as the sole basis for treatment or other patient management decisions. A negative result may occur with  improper specimen collection/handling, submission of specimen other than nasopharyngeal swab, presence of viral mutation(s) within the areas targeted by this assay, and inadequate number of viral copies(<138 copies/mL). A negative result must be combined with clinical observations, patient history, and epidemiological information. The expected result is Negative.  Fact Sheet for Patients:  BloggerCourse.com  Fact Sheet for Healthcare Providers:  SeriousBroker.it  This test is no t yet approved or cleared by the [place] FDA and  has been authorized for detection and/or diagnosis of SARS-CoV-2 by FDA under an Emergency Use Authorization (EUA). This EUA will remain  in effect (meaning this test can be used) for the duration of the COVID-19 declaration under Section 564(b)(1) of the Act, 21 U.S.C.section 360bbb-3(b)(1), unless the authorization is terminated  or revoked sooner.       Influenza A by PCR NEGATIVE NEGATIVE Final   Influenza B by PCR NEGATIVE NEGATIVE Final    Comment: (  NOTE) The Xpert Xpress SARS-CoV-2/FLU/RSV plus assay is intended as an aid in the diagnosis of influenza from Nasopharyngeal swab specimens and should not be used as a sole basis for treatment. Nasal washings and aspirates are unacceptable for Xpert Xpress SARS-CoV-2/FLU/RSV testing.  Fact Sheet for Patients: BloggerCourse.com  Fact Sheet for Healthcare Providers: SeriousBroker.it  This test is not yet approved or cleared by the [place] FDA and has been  authorized for detection and/or diagnosis of SARS-CoV-2 by FDA under an Emergency Use Authorization (EUA). This EUA will remain in effect (meaning this test can be used) for the duration of the COVID-19 declaration under Section 564(b)(1) of the Act, 21 U.S.C. section 360bbb-3(b)(1), unless the authorization is terminated or revoked.  Performed at [facility], [address], [place], [place] [address]     Imaging:  Assessment   30 y.o. G2P1001 at [redacted]w[redacted]d by Estimated Date of Delivery: 03/05/21 with superimposed preeclampsia, labial BP's but no severe features  Plan   1) Superimposed pre-eclampsia - inpatient admission till [redacted] weeks gestation at which time proceed with delivery - antepartum surveillance once daily NST, and twice weekly BPP - trend labs weekly (CMP and CBC) with 4-6 hour repeat if new abnormality noted (thrombocytopenia, Cr elevation, LFT elevation) - Goal BP <150/105 and >120/70 - If delivery indicated before 32 weeks magnesium sulfate for CP prophylaxis - Consider repeat course of BMZ (Completed initial course 5/10 & 5/11)   2) FEN - general diet  3) DVT ppx - SCD's  4) Disposition - delivery 34 weeks.  Given history of prior cesarean section and gestational age in setting of previous cesarean section discussed pro and cons TOLAC vs RLTCS

## 2020-12-31 NOTE — Progress Notes (Signed)
TO BP for NST via WC by S. [place].

## 2021-01-01 DIAGNOSIS — O10013 Pre-existing essential hypertension complicating pregnancy, third trimester: Secondary | ICD-10-CM | POA: Diagnosis not present

## 2021-01-01 DIAGNOSIS — Z3A31 31 weeks gestation of pregnancy: Secondary | ICD-10-CM | POA: Diagnosis not present

## 2021-01-01 DIAGNOSIS — O113 Pre-existing hypertension with pre-eclampsia, third trimester: Secondary | ICD-10-CM | POA: Diagnosis not present

## 2021-01-01 LAB — COMPREHENSIVE METABOLIC PANEL
ALT: 8 U/L (ref 0–44)
AST: 14 U/L — ABNORMAL LOW (ref 15–41)
Albumin: 2.9 g/dL — ABNORMAL LOW (ref 3.5–5.0)
Alkaline Phosphatase: 80 U/L (ref 38–126)
Anion gap: 11 (ref 5–15)
BUN: 11 mg/dL (ref 6–20)
CO2: 21 mmol/L — ABNORMAL LOW (ref 22–32)
Calcium: 9.6 mg/dL (ref 8.9–10.3)
Chloride: 103 mmol/L (ref 98–111)
Creatinine, Ser: 0.66 mg/dL (ref 0.44–1.00)
GFR, Estimated: >60 mL/min (ref >60)
Glucose, Bld: 102 mg/dL — ABNORMAL HIGH (ref 70–99)
Potassium: 4.3 mmol/L (ref 3.5–5.1)
Sodium: 135 mmol/L (ref 135–145)
Total Bilirubin: 0.3 mg/dL (ref 0.3–1.2)
Total Protein: 7.5 g/dL (ref 6.5–8.1)

## 2021-01-01 MED ORDER — VALACYCLOVIR HCL 500 MG PO TABS
1000.0000 mg | ORAL_TABLET | Freq: Every day | ORAL | Status: DC
  Administered 2021-01-01 – 2021-01-10 (×10): 1000 mg via ORAL
  Filled 2021-01-01 (×10): qty 2

## 2021-01-01 NOTE — Progress Notes (Signed)
Back to room. Kristina Mccann Other states NST was reactive.

## 2021-01-01 NOTE — H&P (Signed)
Obstetric and Gynecology  Subjective  Doing well.  No HA, vision changes, RUQ/epigastric pain.  +FM, no LOF, no VB, no contractions  Objective  Vital signs in last 24 hours: Temp:  [98 F (36.7 C)-98.4 F (36.9 C)] 98.4 F (36.9 C) (05/22 0736) Pulse Rate:  [99-121] 99 (05/22 0736) Resp:  [18-20] 18 (05/22 0736) BP: (111-143)/(71-100) 113/78 (05/22 0736) SpO2:  [99 %-100 %] 99 % (05/22 0736) Weight:  [99.6 kg] 99.6 kg (05/22 0504) Last BM Date: 12/28/20  No intake or output data in the 24 hours ending 01/01/21 0956  General: NAD Pulmonary: no increased work of breathing Abdomen: gravid, non-tender Extremities: no edema  Labs: Results for orders placed or performed during the hospital encounter of 12/20/20 (from the past 24 hour(s))  CMP     Status: Abnormal   Collection Time: 01/01/21  7:04 AM  Result Value Ref Range   Sodium 135 135 - 145 mmol/L   Potassium 4.3 3.5 - 5.1 mmol/L   Chloride 103 98 - 111 mmol/L   CO2 21 (L) 22 - 32 mmol/L   Glucose, Bld 102 (H) 70 - 99 mg/dL   BUN 11 6 - 20 mg/dL   Creatinine, Ser 3.01 0.44 - 1.00 mg/dL   Calcium 9.6 8.9 - 60.1 mg/dL   Total Protein 7.5 6.5 - 8.1 g/dL   Albumin 2.9 (L) 3.5 - 5.0 g/dL   AST 14 (L) 15 - 41 U/L   ALT 8 0 - 44 U/L   Alkaline Phosphatase 80 38 - 126 U/L   Total Bilirubin 0.3 0.3 - 1.2 mg/dL   GFR, Estimated >09 >32 mL/min   Anion gap 11 5 - 15    Cultures: Results for orders placed or performed during the hospital encounter of 12/20/20  Resp Panel by RT-PCR (Flu A&B, Covid) Nasopharyngeal Swab     Status: None   Collection Time: 12/22/20  5:30 PM   Specimen: Nasopharyngeal Swab; Nasopharyngeal(NP) swabs in vial transport medium  Result Value Ref Range Status   SARS Coronavirus 2 by RT PCR NEGATIVE NEGATIVE Final    Comment: (NOTE) SARS-CoV-2 target nucleic acids are NOT DETECTED.  The SARS-CoV-2 RNA is generally detectable in upper respiratory specimens during the acute phase of infection. The  lowest concentration of SARS-CoV-2 viral copies this assay can detect is 138 copies/mL. A negative result does not preclude SARS-Cov-2 infection and should not be used as the sole basis for treatment or other patient management decisions. A negative result may occur with  improper specimen collection/handling, submission of specimen other than nasopharyngeal swab, presence of viral mutation(s) within the areas targeted by this assay, and inadequate number of viral copies(<138 copies/mL). A negative result must be combined with clinical observations, patient history, and epidemiological information. The expected result is Negative.  Fact Sheet for Patients:  BloggerCourse.com  Fact Sheet for Healthcare Providers:  SeriousBroker.it  This test is no t yet approved or cleared by the [place] FDA and  has been authorized for detection and/or diagnosis of SARS-CoV-2 by FDA under an Emergency Use Authorization (EUA). This EUA will remain  in effect (meaning this test can be used) for the duration of the COVID-19 declaration under Section 564(b)(1) of the Act, 21 U.S.C.section 360bbb-3(b)(1), unless the authorization is terminated  or revoked sooner.       Influenza A by PCR NEGATIVE NEGATIVE Final   Influenza B by PCR NEGATIVE NEGATIVE Final    Comment: (NOTE) The Xpert Xpress SARS-CoV-2/FLU/RSV plus assay  is intended as an aid in the diagnosis of influenza from Nasopharyngeal swab specimens and should not be used as a sole basis for treatment. Nasal washings and aspirates are unacceptable for Xpert Xpress SARS-CoV-2/FLU/RSV testing.  Fact Sheet for Patients: BloggerCourse.com  Fact Sheet for Healthcare Providers: SeriousBroker.it  This test is not yet approved or cleared by the [place] FDA and has been authorized for detection and/or diagnosis of SARS-CoV-2 by FDA under  an Emergency Use Authorization (EUA). This EUA will remain in effect (meaning this test can be used) for the duration of the COVID-19 declaration under Section 564(b)(1) of the Act, 21 U.S.C. section 360bbb-3(b)(1), unless the authorization is terminated or revoked.  Performed at [facility], [address] [place], [place] [address]     Imaging:  Assessment   30 y.o. G2P1001 at [redacted]w[redacted]d with superimposed preeclampsia   Plan   1) Superimposed pre-eclampsia - inpatient admission till [redacted] weeks gestation at which time proceed with delivery - antepartum surveillance once daily NST, and twice weekly BPP - trend labs weekly (CMP and CBC) with 4-6 hour repeat if new abnormality noted (thrombocytopenia, Cr elevation, LFT elevation) - Goal BP <150/105 and >120/70 - If delivery indicated before 32 weeks magnesium sulfate for CP prophylaxis - Consider repeat course of BMZ (Completed initial course 5/10 & 5/11)   2) FEN - general diet  3) DVT ppx - SCD's  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  Given history of prior cesarean section and gestational age in setting of previous cesarean section discussed pro and cons TOLAC vs RLTCS

## 2021-01-01 NOTE — Progress Notes (Signed)
TO L&D for NST via wheel chair.

## 2021-01-02 ENCOUNTER — Inpatient Hospital Stay: Payer: Medicaid Other

## 2021-01-02 DIAGNOSIS — Z3A00 Weeks of gestation of pregnancy not specified: Secondary | ICD-10-CM | POA: Diagnosis not present

## 2021-01-02 DIAGNOSIS — O10013 Pre-existing essential hypertension complicating pregnancy, third trimester: Secondary | ICD-10-CM | POA: Diagnosis not present

## 2021-01-02 DIAGNOSIS — O113 Pre-existing hypertension with pre-eclampsia, third trimester: Secondary | ICD-10-CM | POA: Diagnosis not present

## 2021-01-02 DIAGNOSIS — O1493 Unspecified pre-eclampsia, third trimester: Secondary | ICD-10-CM | POA: Diagnosis not present

## 2021-01-02 DIAGNOSIS — Z3A31 31 weeks gestation of pregnancy: Secondary | ICD-10-CM | POA: Diagnosis not present

## 2021-01-02 LAB — COMPREHENSIVE METABOLIC PANEL
ALT: 8 U/L (ref 0–44)
AST: 18 U/L (ref 15–41)
Albumin: 2.9 g/dL — ABNORMAL LOW (ref 3.5–5.0)
Alkaline Phosphatase: 82 U/L (ref 38–126)
Anion gap: 10 (ref 5–15)
BUN: 12 mg/dL (ref 6–20)
CO2: 20 mmol/L — ABNORMAL LOW (ref 22–32)
Calcium: 9.4 mg/dL (ref 8.9–10.3)
Chloride: 103 mmol/L (ref 98–111)
Creatinine, Ser: 0.57 mg/dL (ref 0.44–1.00)
GFR, Estimated: >60 mL/min (ref >60)
Glucose, Bld: 122 mg/dL — ABNORMAL HIGH (ref 70–99)
Potassium: 4.2 mmol/L (ref 3.5–5.1)
Sodium: 133 mmol/L — ABNORMAL LOW (ref 135–145)
Total Bilirubin: 0.3 mg/dL (ref 0.3–1.2)
Total Protein: 7.6 g/dL (ref 6.5–8.1)

## 2021-01-02 LAB — CBC
HCT: 33.8 % — ABNORMAL LOW (ref 36.0–46.0)
Hemoglobin: 11.3 g/dL — ABNORMAL LOW (ref 12.0–15.0)
MCH: 28 pg (ref 26.0–34.0)
MCHC: 33.4 g/dL (ref 30.0–36.0)
MCV: 83.7 fL (ref 80.0–100.0)
Platelets: 304 10*3/uL (ref 150–400)
RBC: 4.04 MIL/uL (ref 3.87–5.11)
RDW: 12.9 % (ref 11.5–15.5)
WBC: 6.8 10*3/uL (ref 4.0–10.5)
nRBC: 0 % (ref 0.0–0.2)

## 2021-01-02 NOTE — Progress Notes (Signed)
Pt transferred to US. 

## 2021-01-02 NOTE — Progress Notes (Signed)
Subjective:  Kristina Mccann is a 30 year old Gravida 2 para 1, now at 31 weeks 1 day gestation , who was admitted last week for superimposed pre eclampsia. Her hx is significant for  a previous Cs for HSV+, Morbid obesity, and she is taking daily procardia for her HTN.  She is staying on the Mother Baby unit until 34 weeks , when plans are for delivery, likely by repeat CS. This morning she reports feeling well. She denies any headache, changes in vision , chest pain or shortness of breath.  She is wearing SCD hose when in bed Patient reports tolerating PO and no problems voiding.  Her baby is moving well. She denies any headache, chest pain or shortness of breath.  Objective: I have reviewed patient's vital signs, intake and output, medications and labs. BP 124/83   Pulse (!) 119   Temp 98 F (36.7 C) (Oral)   Resp 18   Ht 5\' 2"  (1.575 m)   Wt 99.6 kg   LMP 05/29/2020 Comment: approx [redacted] weeks pregnant   SpO2 100%   BMI 40.16 kg/m   General: alert, cooperative and moderately obese Resp: clear to auscultation bilaterally Cardio: regular rhythm, slight tachycardia  (this is her baseline) Extremities: no edema or calf pain. Normal reflexes.  Nst due this morning.   Assessment/Plan: IUP 31 weeks 1 day Chronic HTN with superimposed pre Eclampsia Blood pressures stable on Procardia daily dosing. Doing well.  Plan: continue present orders. Ultrasound today for BPP and AFI.   LOS: 11 days    05/31/2020 01/02/2021, 11:13 AM

## 2021-01-02 NOTE — Progress Notes (Signed)
   01/02/21 1500  Clinical Encounter Type  Visited With Patient  Visit Type Follow-up;Spiritual support  Referral From Chaplain  Consult/Referral To Chaplain  Spiritual Encounters  Spiritual Needs Emotional  Chaplain Ahlia Lemanski visited room 340, Pt Kristina Mccann. Pt stated, she has to be here approximately 3 more weeks and then they will deliver by C-Section. Pt is in good spirits and said she is doing ok and just staying positive.

## 2021-01-02 NOTE — Progress Notes (Signed)
   01/02/21 2230  Fetal Heart Rate A  Mode External  Baseline Rate (A) 155 bpm  Variability 6-25 BPM  Accelerations 15 x 15  Decelerations None  Uterine Activity  Mode Toco  Contraction Frequency (min) none    During pt.'s NST, fetal heart tracing is category 1 - 15x15 accels, no decels, moderate variability. Strip is reassuring.

## 2021-01-03 DIAGNOSIS — Z5181 Encounter for therapeutic drug level monitoring: Secondary | ICD-10-CM | POA: Diagnosis not present

## 2021-01-03 DIAGNOSIS — Z3A31 31 weeks gestation of pregnancy: Secondary | ICD-10-CM

## 2021-01-03 DIAGNOSIS — O113 Pre-existing hypertension with pre-eclampsia, third trimester: Secondary | ICD-10-CM | POA: Diagnosis not present

## 2021-01-03 DIAGNOSIS — O10013 Pre-existing essential hypertension complicating pregnancy, third trimester: Secondary | ICD-10-CM | POA: Diagnosis not present

## 2021-01-03 LAB — CBC
HCT: 32.2 % — ABNORMAL LOW (ref 36.0–46.0)
Hemoglobin: 10.7 g/dL — ABNORMAL LOW (ref 12.0–15.0)
MCH: 28.2 pg (ref 26.0–34.0)
MCHC: 33.2 g/dL (ref 30.0–36.0)
MCV: 84.7 fL (ref 80.0–100.0)
Platelets: 277 10*3/uL (ref 150–400)
RBC: 3.8 MIL/uL — ABNORMAL LOW (ref 3.87–5.11)
RDW: 13.1 % (ref 11.5–15.5)
WBC: 7.9 10*3/uL (ref 4.0–10.5)
nRBC: 0 % (ref 0.0–0.2)

## 2021-01-03 LAB — COMPREHENSIVE METABOLIC PANEL
ALT: 10 U/L (ref 0–44)
AST: 17 U/L (ref 15–41)
Albumin: 2.8 g/dL — ABNORMAL LOW (ref 3.5–5.0)
Alkaline Phosphatase: 75 U/L (ref 38–126)
Anion gap: 10 (ref 5–15)
BUN: 11 mg/dL (ref 6–20)
CO2: 20 mmol/L — ABNORMAL LOW (ref 22–32)
Calcium: 8.9 mg/dL (ref 8.9–10.3)
Chloride: 103 mmol/L (ref 98–111)
Creatinine, Ser: 0.8 mg/dL (ref 0.44–1.00)
GFR, Estimated: >60 mL/min (ref >60)
Glucose, Bld: 132 mg/dL — ABNORMAL HIGH (ref 70–99)
Potassium: 4.1 mmol/L (ref 3.5–5.1)
Sodium: 133 mmol/L — ABNORMAL LOW (ref 135–145)
Total Bilirubin: 0.2 mg/dL — ABNORMAL LOW (ref 0.3–1.2)
Total Protein: 7.4 g/dL (ref 6.5–8.1)

## 2021-01-03 LAB — GLUCOSE, CAPILLARY: Glucose-Capillary: 114 mg/dL — ABNORMAL HIGH (ref 70–99)

## 2021-01-03 NOTE — Progress Notes (Signed)
Daily Antepartum Note  Admission Date: 12/20/2020 Current Date: 01/03/2021 11:42 AM  Kristina Mccann is a 30 y.o. G2P1001 @ [redacted]w[redacted]d by LMP consistent with 10 week ultrasound, HD#15, admitted for superimposed preeclampsia on chronic hypertension with labile blood pressure. Diagnosed during hospitalization with severe features.  Pregnancy complicated by:  Patient Active Problem List   Diagnosis Date Noted  . Chronic hypertension with superimposed preeclampsia 12/22/2020  . Pregnancy with history of cesarean section, antepartum 12/12/2020  . Marijuana user 09/18/2020  . Maternal morbid obesity in third trimester, antepartum (HCC) 09/08/2020  . BMI 40.0-44.9, adult (HCC) 09/08/2020  . Supervision of high risk pregnancy, antepartum 09/02/2020  . 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 Major depression, chronic (HCC) 04/11/2015  . Gastro-esophageal reflux disease without esophagitis 04/11/2015  . H/O suicide attempt 04/11/2015  . Migraine without aura and responsive to treatment 04/11/2015    Overnight/24hr events:  No acute events  Subjective:  Patient feeling well. Tolerating prolonged hospitalization ok at this point.  Denies HA, visual changes, and RUQ pain.  Notes +FM, no LOF, no vaginal bleeding, no contractions. She is intermittently wearing her SCDs.   Objective:   Vitals:   01/03/21 0339 01/03/21 0800  BP: 107/72 111/73  Pulse: (!) 122 (!) 110  Resp: 20 18  Temp: 97.7 F (36.5 C) 97.9 F (36.6 C)  SpO2: 96% 98%   Temp:  [97.7 F (36.5 C)-98.5 F (36.9 C)] 97.9 F (36.6 C) (05/24 0800) Pulse Rate:  [105-122] 110 (05/24 0800) Resp:  [18-20] 18 (05/24 0800) BP: (107-139)/(72-89) 111/73 (05/24 0800) SpO2:  [96 %-100 %] 98 % (05/24 0800) Weight:  [100.4 kg] 100.4 kg (05/24 0647) Temp (24hrs), Avg:98.2 F (36.8 C), Min:97.7 F (36.5 C), Max:98.5 F (36.9 C)  No intake or output data in the 24 hours ending 01/03/21 1142   Current Vital Signs 24h Vital Sign Ranges  T 97.9  F (36.6 C) Temp  Avg: 98.2 F (36.8 C)  Min: 97.7 F (36.5 C)  Max: 98.5 F (36.9 C)  BP 111/73 BP  Min: 107/72  Max: 139/89  HR (!) 110 Pulse  Avg: 114.8  Min: 105  Max: 122  RR 18 Resp  Avg: 19.6  Min: 18  Max: 20  SaO2 98 % Room Air SpO2  Avg: 98.6 %  Min: 96 %  Max: 100 %       24 Hour I/O Current Shift I/O  Time Ins Outs No intake/output data recorded. No intake/output data recorded.   Patient Vitals for the past 24 hrs:  BP Temp Temp src Pulse Resp SpO2 Weight  01/03/21 0800 111/73 97.9 F (36.6 C) Oral (!) 110 18 98 % --  01/03/21 0647 -- -- -- -- -- -- 100.4 kg  01/03/21 0339 107/72 97.7 F (36.5 C) Oral (!) 122 20 96 % --  01/02/21 2333 -- 98.3 F (36.8 C) Oral -- -- -- --  01/02/21 2314 139/89 98.4 F (36.9 C) Oral (!) 117 20 100 % --  01/02/21 2002 129/89 98.2 F (36.8 C) Oral (!) 105 20 100 % --  01/02/21 1600 133/86 98.5 F (36.9 C) Oral (!) 120 20 99 % --    Physical exam: General: Well nourished, well developed female in no acute distress. Abdomen: gravid NT Cardiovascular: tachycardia, regular rate and rhythm.  Respiratory: CTAB Extremities: no clubbing, cyanosis or edema Skin: Warm and dry.   Medications: Current Facility-Administered Medications  Medication Dose Route Frequency Provider Last Rate Last Admin  .  acetaminophen (TYLENOL) tablet 1,000 mg  1,000 mg Oral Q6H PRN Vena Austria, MD   1,000 mg at 12/30/20 0949  . aspirin chewable tablet 81 mg  81 mg Oral Daily Mirna Mires, CNM   81 mg at 01/03/21 8280  . calcium carbonate (TUMS - dosed in mg elemental calcium) chewable tablet 400 mg of elemental calcium  2 tablet Oral TID PRN Zipporah Plants, CNM   400 mg of elemental calcium at 01/02/21 1332  . calcium carbonate (TUMS - dosed in mg elemental calcium) chewable tablet 400 mg of elemental calcium  400 mg of elemental calcium Oral BID Tresea Mall, CNM   400 mg of elemental calcium at 01/03/21 0912  . labetalol (NORMODYNE) injection 20  mg  20 mg Intravenous PRN Vena Austria, MD   20 mg at 12/22/20 0349   And  . labetalol (NORMODYNE) injection 40 mg  40 mg Intravenous PRN Vena Austria, MD   40 mg at 12/20/20 1534   And  . labetalol (NORMODYNE) injection 80 mg  80 mg Intravenous PRN Vena Austria, MD       And  . hydrALAZINE (APRESOLINE) injection 10 mg  10 mg Intravenous PRN Vena Austria, MD      . NIFEdipine (PROCARDIA-XL/NIFEDICAL-XL) 24 hr tablet 30 mg  30 mg Oral Daily Schuman, Christanna R, MD   30 mg at 01/02/21 1559  . prenatal multivitamin tablet 1 tablet  1 tablet Oral Q1200 Mirna Mires, CNM   1 tablet at 01/02/21 1559  . valACYclovir (VALTREX) tablet 1,000 mg  1,000 mg Oral Daily Vena Austria, MD   1,000 mg at 01/03/21 0912    Labs:  Recent Labs  Lab 01/02/21 0831  WBC 6.8  HGB 11.3*  HCT 33.8*  PLT 304    Recent Labs  Lab 12/31/20 0517 01/01/21 0704 01/02/21 0831  NA 133* 135 133*  K 4.3 4.3 4.2  CL 104 103 103  CO2 22 21* 20*  BUN 11 11 12   CREATININE 0.69 0.66 0.57  CALCIUM 9.1 9.6 9.4  PROT 7.1 7.5 7.6  BILITOT 0.4 0.3 0.3  ALKPHOS 73 80 82  ALT 10 8 8   AST 14* 14* 18  GLUCOSE 112* 102* 122*     Radiology:  FETAL BPP WO NON STRESS  Result Date: 01/02/2021 CLINICAL DATA:  Preeclampsia in 3rd trimester pregnancy. EXAM: BIOPHYSICAL PROFILE FINDINGS: Number of Fetuses: 1 Heart Rate: 148 bpm Presentation: Cephalic Movement: 2 time: 7 minutes Breathing: 2 Tone: 2 Amniotic Fluid: 2 Total Score: 8 IMPRESSION: Biophysical profile score of 8/8. Electronically Signed   By: [place] M.D.   On: 01/02/2021 16:07     Assessment & Plan:  30 y.o. G2P1001 female at [redacted]w[redacted]d with superimposed preeclampsia on chronic hypertension with severe features, history of cesarean section.   *Pregnancy:  ** noted some elevated blood glucose on CMP.  Will start capillary BG testing with fasting and 2h pp. If elevated, add medication as needed. ** PNVs ** most likely delivery by  c-section given early gestational age and probable lack of cervical dilation.   * Superimposed preeclampsia with severe features: daily lab work, twice weekly BPP and daily NST.  ** Continue nifedipine and watch BPs  *Preterm: If needs delivery prior to 32 weeks, add magnesium, though will likely need to add for seizure prophylaxis anyway.  *Tachycardia: EKG  *PPx: SCDs, consider adding daily heparin  *FEN/GI: regular diet, no MIVF needed at this time.  If BG running  high, may need to move to carb modified diet.   *Dispo: home after delivery   Thomasene Mohair, MD, [facility], [facility] 01/03/2021 11:54 AM

## 2021-01-03 NOTE — Progress Notes (Signed)
RN performed NST in Pt. Room 340 on MBU. NST Reactive.   01/03/21 1434  Fetal Heart Rate A  Mode External  Baseline Rate (A) 150 bpm  Variability 6-25 BPM  Accelerations 15 x 15  Decelerations Variable  Uterine Activity  Mode Toco  Contraction Frequency (min) None Noted  OB Interventions  Interventions Other (comment) (monitors removed)

## 2021-01-04 DIAGNOSIS — Z3A31 31 weeks gestation of pregnancy: Secondary | ICD-10-CM | POA: Diagnosis not present

## 2021-01-04 DIAGNOSIS — O34219 Maternal care for unspecified type scar from previous cesarean delivery: Secondary | ICD-10-CM | POA: Diagnosis not present

## 2021-01-04 DIAGNOSIS — O10013 Pre-existing essential hypertension complicating pregnancy, third trimester: Secondary | ICD-10-CM | POA: Diagnosis not present

## 2021-01-04 DIAGNOSIS — O119 Pre-existing hypertension with pre-eclampsia, unspecified trimester: Secondary | ICD-10-CM | POA: Diagnosis not present

## 2021-01-04 DIAGNOSIS — O113 Pre-existing hypertension with pre-eclampsia, third trimester: Secondary | ICD-10-CM | POA: Diagnosis not present

## 2021-01-04 LAB — GLUCOSE, CAPILLARY
Glucose-Capillary: 161 mg/dL — ABNORMAL HIGH (ref 70–99)
Glucose-Capillary: 97 mg/dL (ref 70–99)
Glucose-Capillary: 153 mg/dL — ABNORMAL HIGH (ref 70–99)
Glucose-Capillary: 130 mg/dL — ABNORMAL HIGH (ref 70–99)
Glucose-Capillary: 145 mg/dL — ABNORMAL HIGH (ref 70–99)

## 2021-01-04 NOTE — Progress Notes (Signed)
Daily Antepartum Note  Admission Date: 12/20/2020 Current Date: 01/04/2021 10:18 AM  Kristina Mccann is a 30 y.o. G2P1001 @ [redacted]w[redacted]d by LMP consistent with 10 week ultrasound, HD#16, admitted for superimposed preeclampsia on chronic hypertension with labile blood pressure. Diagnosed during hospitalization with severe features.  Pregnancy complicated by:  Patient Active Problem List   Diagnosis Date Noted  . Chronic hypertension with superimposed preeclampsia 12/22/2020  . Pregnancy with history of cesarean section, antepartum 12/12/2020  . Marijuana user 09/18/2020  . Maternal morbid obesity in third trimester, antepartum (HCC) 09/08/2020  . BMI 40.0-44.9, adult (HCC) 09/08/2020  . Supervision of high risk pregnancy, antepartum 09/02/2020  . 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 Major depression, chronic (HCC) 04/11/2015  . Gastro-esophageal reflux disease without esophagitis 04/11/2015  . H/O suicide attempt 04/11/2015  . Migraine without aura and responsive to treatment 04/11/2015    Overnight/24hr events:  No acute events  Subjective:  Patient feeling well and has no complaints. She is tolerating prolonged hospitalization ok at this point.  Denies HA, visual changes, and RUQ pain. She reports good fetal movement and denies contractions, vaginal bleeding or leakage of fluid. She is intermittently wearing her SCDs.   Objective:   Vitals:   01/04/21 0359 01/04/21 0746  BP: 121/72 134/88  Pulse: 97 (!) 106  Resp: 19 18  Temp: 97.9 F (36.6 C) 98.2 F (36.8 C)  SpO2: 100% 99%   Temp:  [97.9 F (36.6 C)-99.1 F (37.3 C)] 98.2 F (36.8 C) (05/25 0746) Pulse Rate:  [97-130] 106 (05/25 0746) Resp:  [18-20] 18 (05/25 0746) BP: (120-134)/(71-91) 134/88 (05/25 0746) SpO2:  [98 %-100 %] 99 % (05/25 0746) Weight:  [100.8 kg] 100.8 kg (05/25 0359) Temp (24hrs), Avg:98.4 F (36.9 C), Min:97.9 F (36.6 C), Max:99.1 F (37.3 C)   Intake/Output Summary (Last 24 hours) at 01/04/2021  1018 Last data filed at 01/03/2021 1413 Gross per 24 hour  Intake 120 ml  Output --  Net 120 ml     Current Vital Signs 24h Vital Sign Ranges  T 98.2 F (36.8 C) Temp  Avg: 98.4 F (36.9 C)  Min: 97.9 F (36.6 C)  Max: 99.1 F (37.3 C)  BP 134/88 BP  Min: 120/81  Max: 134/88  HR (!) 106 (nurse Kendra Pnotified) Pulse  Avg: 113.7  Min: 97  Max: 130  RR 18 Resp  Avg: 18.8  Min: 18  Max: 20  SaO2 99 % Room Air SpO2  Avg: 99.5 %  Min: 98 %  Max: 100 %       24 Hour I/O Current Shift I/O  Time Ins Outs 05/24 0701 - 05/25 0700 In: 120 [P.O.:120] Out: -  No intake/output data recorded.   Patient Vitals for the past 24 hrs:  BP Temp Temp src Pulse Resp SpO2 Weight  01/04/21 0746 134/88 98.2 F (36.8 C) Oral (!) 106 18 99 % --  01/04/21 0359 121/72 97.9 F (36.6 C) Oral 97 19 100 % 100.8 kg  01/03/21 2330 121/76 99.1 F (37.3 C) Oral (!) 114 20 100 % --  01/03/21 1947 (!) 130/91 98.5 F (36.9 C) Oral (!) 120 20 100 % --  01/03/21 1607 120/81 98.2 F (36.8 C) Oral (!) 115 18 98 % --  01/03/21 1145 128/71 98.6 F (37 C) Oral (!) 130 18 100 % --    Physical exam: General: Well nourished, well developed female in no acute distress. Abdomen: gravid NT Cardiovascular: regular rate and rhythm. Rate  normalizing Respiratory: CTAB Extremities: no clubbing, cyanosis or edema Skin: Warm and dry.   Medications: Current Facility-Administered Medications  Medication Dose Route Frequency Provider Last Rate Last Admin  . acetaminophen (TYLENOL) tablet 1,000 mg  1,000 mg Oral Q6H PRN Vena Austria, MD   1,000 mg at 12/30/20 0949  . aspirin chewable tablet 81 mg  81 mg Oral Daily Mirna Mires, CNM   81 mg at 01/04/21 8563  . calcium carbonate (TUMS - dosed in mg elemental calcium) chewable tablet 400 mg of elemental calcium  2 tablet Oral TID PRN Zipporah Plants, CNM   400 mg of elemental calcium at 01/03/21 1610  . calcium carbonate (TUMS - dosed in mg elemental calcium) chewable  tablet 400 mg of elemental calcium  400 mg of elemental calcium Oral BID Tresea Mall, CNM   400 mg of elemental calcium at 01/04/21 0950  . labetalol (NORMODYNE) injection 20 mg  20 mg Intravenous PRN Vena Austria, MD   20 mg at 12/22/20 1497   And  . labetalol (NORMODYNE) injection 40 mg  40 mg Intravenous PRN Vena Austria, MD   40 mg at 12/20/20 1534   And  . labetalol (NORMODYNE) injection 80 mg  80 mg Intravenous PRN Vena Austria, MD       And  . hydrALAZINE (APRESOLINE) injection 10 mg  10 mg Intravenous PRN Vena Austria, MD      . NIFEdipine (PROCARDIA-XL/NIFEDICAL-XL) 24 hr tablet 30 mg  30 mg Oral Daily Schuman, Christanna R, MD   30 mg at 01/03/21 1320  . prenatal multivitamin tablet 1 tablet  1 tablet Oral Q1200 Mirna Mires, CNM   1 tablet at 01/03/21 1217  . valACYclovir (VALTREX) tablet 1,000 mg  1,000 mg Oral Daily Vena Austria, MD   1,000 mg at 01/04/21 0263    Labs:  Recent Labs  Lab 01/02/21 0831 01/03/21 1231  WBC 6.8 7.9  HGB 11.3* 10.7*  HCT 33.8* 32.2*  PLT 304 277    Recent Labs  Lab 01/01/21 0704 01/02/21 0831 01/03/21 1231  NA 135 133* 133*  K 4.3 4.2 4.1  CL 103 103 103  CO2 21* 20* 20*  BUN 11 12 11   CREATININE 0.66 0.57 0.80  CALCIUM 9.6 9.4 8.9  PROT 7.5 7.6 7.4  BILITOT 0.3 0.3 0.2*  ALKPHOS 80 82 75  ALT 8 8 10   AST 14* 18 17  GLUCOSE 102* 122* 132*     Radiology:  FETAL BPP WO NON STRESS  Result Date: 01/02/2021 CLINICAL DATA:  Preeclampsia in 3rd trimester pregnancy. EXAM: BIOPHYSICAL PROFILE FINDINGS: Number of Fetuses: 1 Heart Rate: 148 bpm Presentation: Cephalic Movement: 2 time: 7 minutes Breathing: 2 Tone: 2 Amniotic Fluid: 2 Total Score: 8 IMPRESSION: Biophysical profile score of 8/8. Electronically Signed   By: [place] M.D.   On: 01/02/2021 16:07     ECG 01/03/21 OCIE, STANZIONE 01/05/21 03-Jan-2021 12:11:07 [facility] ROUTINE RECORD July 22, 1991 (29 yr) Female  Black Room:340A Loc:324 Technician: abl Test ind: Vent. rate 123 BPM PR interval 134 ms QRS duration 82 ms QT/QTcB 322/460 ms P-R-T axes 51 70 -1 Sinus tachycardia Nonspecific T wave abnormality Abnormal ECG When compared with ECG of 26-Dec-2020 12:50, No significant change was found   Assessment & Plan:  30 y.o. G2P1001 female at [redacted]w[redacted]d with superimposed preeclampsia on chronic hypertension with severe features, history of cesarean section.   Pregnancy:  1. Blood glucose wnl, continue daily checks and add  medication if elevated 2. PNVs 3. most likely delivery by c-section given early gestational age and probable lack of cervical dilation.   4. Superimposed preeclampsia with severe features: daily lab work, twice weekly BPP and daily NST.  5. Continue nifedipine and watch BPs  6. Preterm: If needs delivery prior to 32 weeks, add magnesium, though will likely need to add for seizure prophylaxis anyway.  7. PPx: SCDs, consider adding daily heparin per Dr [name] on yesterday's note  8. FEN/GI: regular diet, no MIVF needed at this time.  If BG running high, may need to move to carb modified diet.   9. Dispo: home after delivery   Tresea Mall, CNM [facility] 01/04/2021 10:18 AM

## 2021-01-04 NOTE — TOC Progression Note (Signed)
Transition of Care [facility]) - Progression Note    Patient Details  Name: Kristina Mccann MRN: [id number] Date of Birth: 01-Feb-1991  Transition of Care [facility]) CM/SW Contact  Hetty Ely, RN Phone Number: 01/04/2021, 1:56 PM  Clinical Narrative: Spoke with patient today, she says she and her 29 year old daughter lives in an apartment. 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 Father of the newborn does not live in the home, however they do have a relationship. Her plan is to breast feed as she did with her first child. Patient states she will have family support if needed, mainly her Grandmother. Patient says she and her 9yo are receiving health care at the [facility] and she will also use the same practice for the newborn. Patient verbalizes need for a car seat and a crib for the baby.   TOC to follow up and track for discharge needs.    Expected Discharge Plan: Home/Self Care Barriers to Discharge: Continued Medical Work up  Expected Discharge Plan and Services Expected Discharge Plan: Home/Self Care     Post Acute Care Choice: NA Living arrangements for the past 2 months: Apartment                                       Social Determinants of Health (SDOH) Interventions    Readmission Risk Interventions No flowsheet data found.

## 2021-01-04 NOTE — Progress Notes (Signed)
Daily NST: Baseline FHR: 140 beats/min Variability: moderate Accelerations: present Decelerations: absent Tocometry: quiet  Interpretation:  INDICATIONS: superimposed severe preeclampsia  RESULTS:  A NST procedure was performed with FHR monitoring and a normal baseline established, appropriate time of 20-40 minutes of evaluation, and accels >2 seen w 15x15 characteristics.  Results show a REACTIVE NST.     Thomasene Mohair, MD, [facility], [facility] 01/04/2021 12:15 AM

## 2021-01-05 ENCOUNTER — Inpatient Hospital Stay: Payer: Medicaid Other

## 2021-01-05 DIAGNOSIS — O1490 Unspecified pre-eclampsia, unspecified trimester: Secondary | ICD-10-CM | POA: Diagnosis not present

## 2021-01-05 DIAGNOSIS — O99810 Abnormal glucose complicating pregnancy: Secondary | ICD-10-CM | POA: Diagnosis not present

## 2021-01-05 DIAGNOSIS — O113 Pre-existing hypertension with pre-eclampsia, third trimester: Secondary | ICD-10-CM | POA: Diagnosis not present

## 2021-01-05 DIAGNOSIS — Z3A32 32 weeks gestation of pregnancy: Secondary | ICD-10-CM | POA: Diagnosis not present

## 2021-01-05 DIAGNOSIS — O34219 Maternal care for unspecified type scar from previous cesarean delivery: Secondary | ICD-10-CM | POA: Diagnosis not present

## 2021-01-05 DIAGNOSIS — Z3A00 Weeks of gestation of pregnancy not specified: Secondary | ICD-10-CM | POA: Diagnosis not present

## 2021-01-05 DIAGNOSIS — O10013 Pre-existing essential hypertension complicating pregnancy, third trimester: Secondary | ICD-10-CM | POA: Diagnosis not present

## 2021-01-05 DIAGNOSIS — Z3A31 31 weeks gestation of pregnancy: Secondary | ICD-10-CM

## 2021-01-05 LAB — GLUCOSE, CAPILLARY
Glucose-Capillary: 96 mg/dL (ref 70–99)
Glucose-Capillary: 114 mg/dL — ABNORMAL HIGH (ref 70–99)
Glucose-Capillary: 104 mg/dL — ABNORMAL HIGH (ref 70–99)
Glucose-Capillary: 123 mg/dL — ABNORMAL HIGH (ref 70–99)

## 2021-01-05 NOTE — Progress Notes (Signed)
Patient returned from US.

## 2021-01-05 NOTE — Progress Notes (Signed)
Patient placed on monitor at approx 2052 for NST. Fetal movement palpated and audible on monitor. Patient reports good fetal movement. Denies any contractions. At 2124 Dr. Jerene Pitch notified of normal baseline, moderate variability, and some 10x10 accels noted, but no 15x15 accels observed. Patient given apple juice at that time and repositioned. Moderate variability and 15x15 accel noted.

## 2021-01-05 NOTE — Progress Notes (Signed)
Daily Antepartum Note  Admission Date: 12/20/2020 Current Date: 01/05/2021 10:07 AM  Kristina Mccann is a 30 y.o. G2P1001 @ [redacted]w[redacted]d by LMP consistent with 10 week [place], HD#17, admitted for cHTN with superimposed preeclampsia, notable for labile blood pressure. Diagnosed during hospitalization with severe features.  Pregnancy complicated by: Patient Active Problem List   Diagnosis Date Noted  . [redacted] weeks gestation of pregnancy 01/05/2021  . Abnormal glucose affecting pregnancy 01/05/2021  . Chronic hypertension with superimposed preeclampsia 12/22/2020  . Elevated blood pressure affecting pregnancy in third trimester, antepartum 12/20/2020  . Pregnancy with history of cesarean section, antepartum 12/12/2020  . Marijuana user 09/18/2020  . Maternal morbid obesity in third trimester, antepartum (HCC) 09/08/2020  . BMI 40.0-44.9, adult (HCC) 09/08/2020  . Supervision of high risk pregnancy, antepartum 09/02/2020  . Lives in sheltered housing 04/04/2018  . 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 B12 deficiency 03/22/2016  . Vitamin D deficiency 03/22/2016  . Acanthosis nigricans 04/11/2015  . Major depression, chronic (HCC) 04/11/2015  . Gastro-esophageal reflux disease without esophagitis 04/11/2015  . Anterior knee pain 04/11/2015  . H/O suicide attempt 04/11/2015  . Migraine without aura and responsive to treatment 04/11/2015  . Seasonal allergic rhinitis 04/11/2015    Overnight/24hr events:  Elevated post-prandial blood glucoses noted  Subjective:  Patient has just returned from [place]. Patient is sitting comfortably in bed. Denies any questions or concerns today. States she is feeling well. Denies HA, visual changes, RUQ pain. Reports +FM, no LOF, VB, or ctx. She reports wearing SCDs twice a day.  Objective:   Vitals:   01/05/21 0330 01/05/21 0802  BP: 116/75 136/85  Pulse: 99 98  Resp: 20   Temp: 98.5 F (36.9 C) 98.3 F (36.8 C)  SpO2: 97% 97%   Temp:  [98.3 F (36.8 C)-99 F  (37.2 C)] 98.3 F (36.8 C) (05/26 0802) Pulse Rate:  [98-117] 98 (05/26 0802) Resp:  [14-20] 20 (05/26 0330) BP: (111-136)/(75-89) 136/85 (05/26 0802) SpO2:  [97 %-100 %] 97 % (05/26 0802) Weight:  [101.1 kg] 101.1 kg (05/26 0330) Temp (24hrs), Avg:98.6 F (37 C), Min:98.3 F (36.8 C), Max:99 F (37.2 C)  No intake or output data in the 24 hours ending 01/05/21 1007   Current Vital Signs 24h Vital Sign Ranges  T 98.3 F (36.8 C) Temp  Avg: 98.6 F (37 C)  Min: 98.3 F (36.8 C)  Max: 99 F (37.2 C)  BP 136/85 BP  Min: 111/81  Max: 136/85  HR 98 Pulse  Avg: 109.3  Min: 98  Max: 117  RR 20 Resp  Avg: 18  Min: 14  Max: 20  SaO2 97 % Room Air SpO2  Avg: 98.8 %  Min: 97 %  Max: 100 %       24 Hour I/O Current Shift I/O  Time Ins Outs No intake/output data recorded. No intake/output data recorded.   Patient Vitals for the past 24 hrs:  BP Temp Temp src Pulse Resp SpO2 Weight  01/05/21 0802 136/85 98.3 F (36.8 C) Oral 98 -- 97 % --  01/05/21 0330 116/75 98.5 F (36.9 C) Oral 99 20 97 % 101.1 kg  01/04/21 2311 111/81 98.5 F (36.9 C) Oral (!) 110 20 100 % --  01/04/21 1945 133/89 99 F (37.2 C) Oral (!) 117 20 100 % --  01/04/21 1546 133/83 99 F (37.2 C) Oral (!) 116 14 100 % --  01/04/21 1122 135/86 98.4 F (36.9 C) Oral (!) 116 16 -- --  Physical exam: General: Well nourished, well developed female in no acute distress. Abdomen: gravid, NT, size consistent with dates Cardiovascular: regular rate and rhythm Respiratory: CTAB Extremities: no clubbing, cyanosis or edema Skin: Warm and dry.   Medications: Current Facility-Administered Medications  Medication Dose Route Frequency Provider Last Rate Last Admin  . acetaminophen (TYLENOL) tablet 1,000 mg  1,000 mg Oral Q6H PRN Vena Austria, MD   1,000 mg at 12/30/20 0949  . aspirin chewable tablet 81 mg  81 mg Oral Daily Mirna Mires, CNM   81 mg at 01/05/21 3536  . calcium carbonate (TUMS - dosed in mg  elemental calcium) chewable tablet 400 mg of elemental calcium  2 tablet Oral TID PRN Zipporah Plants, CNM   400 mg of elemental calcium at 01/04/21 1553  . calcium carbonate (TUMS - dosed in mg elemental calcium) chewable tablet 400 mg of elemental calcium  400 mg of elemental calcium Oral BID Tresea Mall, CNM   400 mg of elemental calcium at 01/05/21 0955  . labetalol (NORMODYNE) injection 20 mg  20 mg Intravenous PRN Vena Austria, MD   20 mg at 12/22/20 1443   And  . labetalol (NORMODYNE) injection 40 mg  40 mg Intravenous PRN Vena Austria, MD   40 mg at 12/20/20 1534   And  . labetalol (NORMODYNE) injection 80 mg  80 mg Intravenous PRN Vena Austria, MD       And  . hydrALAZINE (APRESOLINE) injection 10 mg  10 mg Intravenous PRN Vena Austria, MD      . NIFEdipine (PROCARDIA-XL/NIFEDICAL-XL) 24 hr tablet 30 mg  30 mg Oral Daily Schuman, Christanna R, MD   30 mg at 01/04/21 1354  . prenatal multivitamin tablet 1 tablet  1 tablet Oral Q1200 Mirna Mires, CNM   1 tablet at 01/04/21 1208  . valACYclovir (VALTREX) tablet 1,000 mg  1,000 mg Oral Daily Vena Austria, MD   1,000 mg at 01/05/21 1540    Labs:  Recent Labs  Lab 01/02/21 0831 01/03/21 1231  WBC 6.8 7.9  HGB 11.3* 10.7*  HCT 33.8* 32.2*  PLT 304 277    Recent Labs  Lab 01/01/21 0704 01/02/21 0831 01/03/21 1231  NA 135 133* 133*  K 4.3 4.2 4.1  CL 103 103 103  CO2 21* 20* 20*  BUN 11 12 11   CREATININE 0.66 0.57 0.80  CALCIUM 9.6 9.4 8.9  PROT 7.5 7.6 7.4  BILITOT 0.3 0.3 0.2*  ALKPHOS 80 82 75  ALT 8 8 10   AST 14* 18 17  GLUCOSE 102* 122* 132*     Radiology:  Results from today's BPP pending  Assessment & Plan:  30 y.o. G2P1001 at [redacted]w[redacted]d with cHTN with superimposed preeclampsia with severe features, history of cesarean delivery.  1) cHTN with superimposed preeclampsia:   -30 mg procardia XL daily  -Twice weekly BPP  -Daily NSTs  -Weekly PIH labs per MFM rec  -Continue  hospitalization until 34 weeks  2) Elevated postprandial BG values  -Reviewed 1h GTT - 122  -Initiate carb modified diet  -Encouraged daily activity/ambulation  -If BG remain elevated will consider initiation of SS insulin  3) PPx: SCDs - patient reports use regularly throughout day  -Discussed initiation of heparin with patient - patient prefers to increase activity and ambulation  throughout the day - will monitor BP response to activity - if unable to tolerate increased activity -  will further consider heparin  4) Disposition - continue current care  Zipporah Plants, CNM [facility], [facility] 01/05/2021 10:17 AM

## 2021-01-05 NOTE — Progress Notes (Signed)
Patient transported to ultrasound.

## 2021-01-06 ENCOUNTER — Encounter: Payer: Medicaid Other | Admitting: Obstetrics and Gynecology

## 2021-01-06 DIAGNOSIS — O99810 Abnormal glucose complicating pregnancy: Secondary | ICD-10-CM | POA: Diagnosis not present

## 2021-01-06 DIAGNOSIS — O10013 Pre-existing essential hypertension complicating pregnancy, third trimester: Secondary | ICD-10-CM | POA: Diagnosis not present

## 2021-01-06 DIAGNOSIS — Z3A31 31 weeks gestation of pregnancy: Secondary | ICD-10-CM | POA: Diagnosis not present

## 2021-01-06 DIAGNOSIS — O34219 Maternal care for unspecified type scar from previous cesarean delivery: Secondary | ICD-10-CM | POA: Diagnosis not present

## 2021-01-06 DIAGNOSIS — O113 Pre-existing hypertension with pre-eclampsia, third trimester: Secondary | ICD-10-CM | POA: Diagnosis not present

## 2021-01-06 DIAGNOSIS — Z3A32 32 weeks gestation of pregnancy: Secondary | ICD-10-CM | POA: Diagnosis not present

## 2021-01-06 LAB — GLUCOSE, CAPILLARY
Glucose-Capillary: 102 mg/dL — ABNORMAL HIGH (ref 70–99)
Glucose-Capillary: 93 mg/dL (ref 70–99)
Glucose-Capillary: 137 mg/dL — ABNORMAL HIGH (ref 70–99)
Glucose-Capillary: 108 mg/dL — ABNORMAL HIGH (ref 70–99)

## 2021-01-06 NOTE — Progress Notes (Signed)
Patient placed on monitor at 1720, initial FHT 150.  Fetal movement palpated and audible on monitor. Patient denies vaginal bleeding, leaking of fluid, or contractions, and states positive fetal movement. NST was reactive and appropriate for gestational age- moderate variability with 15x15 accels and no decels. Occasional uterine irritability noted with no contractions palpated, pt reports 0/10 pain.  Jobie Quaker, CNM reviewed the paper fetal tracing and tracing was placed back into pt's chart with labels.

## 2021-01-06 NOTE — Progress Notes (Signed)
Patient placed on monitor at 1031, initial FHT 140.  Fetal movement palpated and audible on monitor. Patient denies vaginal bleeding, leaking of fluid, or contractions, and states positive fetal movement. NST was reactive and appropriate for gestational age- moderate variability with 15x15 accels and no decels. No contractions or uterine irritability palpated or traced. Tresea Mall, CNM reviewed the paper fetal tracing at 1115 and tracing was placed back into pt's chart.

## 2021-01-06 NOTE — Progress Notes (Signed)
Daily Antepartum Note  Admission Date: 12/20/2020 Current Date: 01/06/2021 1:35 PM  Kristina Mccann is a 30 y.o. G2P1001 @ [redacted]w[redacted]d by LMP consistent with 10 week [place], HD#18, admitted for cHTN with superimposed preeclampsia, notable for labile blood pressure. Diagnosed during hospitalization with severe features.  Pregnancy complicated by:  Patient Active Problem List   Diagnosis Date Noted  . [redacted] weeks gestation of pregnancy 01/05/2021  . Abnormal glucose affecting pregnancy 01/05/2021  . Chronic hypertension with superimposed preeclampsia 12/22/2020  . Elevated blood pressure affecting pregnancy in third trimester, antepartum 12/20/2020  . Pregnancy with history of cesarean section, antepartum 12/12/2020  . Marijuana user 09/18/2020  . Maternal morbid obesity in third trimester, antepartum (HCC) 09/08/2020  . BMI 40.0-44.9, adult (HCC) 09/08/2020  . Supervision of high risk pregnancy, antepartum 09/02/2020  . Lives in sheltered housing 04/04/2018  . 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 B12 deficiency 03/22/2016  . Vitamin D deficiency 03/22/2016  . Acanthosis nigricans 04/11/2015  . Major depression, chronic (HCC) 04/11/2015  . Gastro-esophageal reflux disease without esophagitis 04/11/2015  . Anterior knee pain 04/11/2015  . H/O suicide attempt 04/11/2015  . Migraine without aura and responsive to treatment 04/11/2015  . Seasonal allergic rhinitis 04/11/2015    Overnight/24hr events:  None  Subjective:  Patient is seated in bed. She has been sleeping most of the morning. The patient reports struggling today with feeling "down." She reports she is "bored" and really wants "to go home."  Objective:   Vitals:   01/06/21 0753 01/06/21 1202  BP: 127/83 (!) 120/93  Pulse: (!) 101 (!) 120  Resp: 15 17  Temp: 98.6 F (37 C) 98.5 F (36.9 C)  SpO2: 97%    Temp:  [98.4 F (36.9 C)-98.6 F (37 C)] 98.5 F (36.9 C) (05/27 1202) Pulse Rate:  [100-120] 120 (05/27 1202) Resp:   [15-20] 17 (05/27 1202) BP: (120-138)/(76-93) 120/93 (05/27 1202) SpO2:  [96 %-99 %] 97 % (05/27 0753) Weight:  [100.9 kg] 100.9 kg (05/27 0321) Temp (24hrs), Avg:98.5 F (36.9 C), Min:98.4 F (36.9 C), Max:98.6 F (37 C)   Intake/Output Summary (Last 24 hours) at 01/06/2021 1335 Last data filed at 01/06/2021 0900 Gross per 24 hour  Intake 240 ml  Output --  Net 240 ml     Current Vital Signs 24h Vital Sign Ranges  T 98.5 F (36.9 C) Temp  Avg: 98.5 F (36.9 C)  Min: 98.4 F (36.9 C)  Max: 98.6 F (37 C)  BP (!) 120/93 (nurse maddy notified) BP  Min: 120/93  Max: 138/92  HR (!) 120 (nurse Maddy B. notified) Pulse  Avg: 108.3  Min: 100  Max: 120  RR 17 Resp  Avg: 18.4  Min: 15  Max: 20  SaO2 97 % Room Air SpO2  Avg: 97.2 %  Min: 96 %  Max: 99 %       24 Hour I/O Current Shift I/O  Time Ins Outs No intake/output data recorded. 05/27 0701 - 05/27 1900 In: 240 [P.O.:240] Out: -    Patient Vitals for the past 24 hrs:  BP Temp Temp src Pulse Resp SpO2 Weight  01/06/21 1202 (!) 120/93 98.5 F (36.9 C) Axillary (!) 120 17 -- --  01/06/21 0753 127/83 98.6 F (37 C) Oral (!) 101 15 97 % --  01/06/21 0321 125/88 98.6 F (37 C) Oral (!) 111 20 96 % 100.9 kg  01/05/21 2313 (!) 138/92 98.6 F (37 C) Oral 100 20 98 % --  01/05/21 1916 120/88 98.5 F (36.9 C) Oral (!) 116 20 99 % --  01/05/21 1559 131/76 98.4 F (36.9 C) Oral (!) 102 -- 96 % --    Physical exam: General: Well nourished, well developed female in no acute distress. Abdomen: gravid, non-tender Cardiovascular: S1, S2 normal, no murmur, rub or gallop, regular rate and rhythm Respiratory: CTAB, regular effort Extremities: no clubbing, cyanosis or edema Skin: Warm and dry.   Medications: Current Facility-Administered Medications  Medication Dose Route Frequency Provider Last Rate Last Admin  . acetaminophen (TYLENOL) tablet 1,000 mg  1,000 mg Oral Q6H PRN Vena Austria, MD   1,000 mg at 12/30/20 0949  .  aspirin chewable tablet 81 mg  81 mg Oral Daily Mirna Mires, CNM   81 mg at 01/06/21 1041  . calcium carbonate (TUMS - dosed in mg elemental calcium) chewable tablet 400 mg of elemental calcium  2 tablet Oral TID PRN Zipporah Plants, CNM   400 mg of elemental calcium at 01/04/21 1553  . calcium carbonate (TUMS - dosed in mg elemental calcium) chewable tablet 400 mg of elemental calcium  400 mg of elemental calcium Oral BID Tresea Mall, CNM   400 mg of elemental calcium at 01/06/21 1041  . labetalol (NORMODYNE) injection 20 mg  20 mg Intravenous PRN Vena Austria, MD   20 mg at 12/22/20 9702   And  . labetalol (NORMODYNE) injection 40 mg  40 mg Intravenous PRN Vena Austria, MD   40 mg at 12/20/20 1534   And  . labetalol (NORMODYNE) injection 80 mg  80 mg Intravenous PRN Vena Austria, MD       And  . hydrALAZINE (APRESOLINE) injection 10 mg  10 mg Intravenous PRN Vena Austria, MD      . NIFEdipine (PROCARDIA-XL/NIFEDICAL-XL) 24 hr tablet 30 mg  30 mg Oral Daily Schuman, Christanna R, MD   30 mg at 01/05/21 1329  . prenatal multivitamin tablet 1 tablet  1 tablet Oral Q1200 Mirna Mires, CNM   1 tablet at 01/06/21 1041  . valACYclovir (VALTREX) tablet 1,000 mg  1,000 mg Oral Daily Vena Austria, MD   1,000 mg at 01/06/21 1041    Labs:  Recent Labs  Lab 01/02/21 0831 01/03/21 1231  WBC 6.8 7.9  HGB 11.3* 10.7*  HCT 33.8* 32.2*  PLT 304 277    Recent Labs  Lab 01/01/21 0704 01/02/21 0831 01/03/21 1231  NA 135 133* 133*  K 4.3 4.2 4.1  CL 103 103 103  CO2 21* 20* 20*  BUN 11 12 11   CREATININE 0.66 0.57 0.80  CALCIUM 9.6 9.4 8.9  PROT 7.5 7.6 7.4  BILITOT 0.3 0.3 0.2*  ALKPHOS 80 82 75  ALT 8 8 10   AST 14* 18 17  GLUCOSE 102* 122* 132*     Radiology:   01/05/21 BPP  CLINICAL DATA:  Preeclampsia EXAM: BIOPHYSICAL PROFILE FINDINGS: Number of Fetuses: 1 Heart Rate: 144 bpm Presentation: Cephalic Movement: 2 time: 24 minutes Breathing:  2 Tone: 2 Amniotic Fluid: 2  Total Score: 8  IMPRESSION: Biophysical profile score of 8/8.  Assessment & Plan:   30 y.o. G2P1001 at [redacted]w[redacted]d with cHTN with superimposed preeclampsia with severe features, history of cesarean delivery.  1) cHTN with superimposed preeclampsia:              -30 mg procardia XL daily             -Twice weekly BPP             -  Daily NSTs             -Weekly PIH labs per MFM rec             -Continue hospitalization until 34 weeks  2) Blood glucose   -Postprandial values normalized after initiating carb modified diet             -Continue to monitor  3) PPx: SCDs - patient reports use regularly throughout day  -Continue to encourage ambulation throughout the day - patient declined  heparin PPx, will reconsider if concerns regarding regular ambulation        4) Disposition - continue current care  Zipporah Plants, CNM [facility], [facility] 01/06/2021 11:41 AM

## 2021-01-07 DIAGNOSIS — O99810 Abnormal glucose complicating pregnancy: Secondary | ICD-10-CM

## 2021-01-07 DIAGNOSIS — O113 Pre-existing hypertension with pre-eclampsia, third trimester: Secondary | ICD-10-CM | POA: Diagnosis not present

## 2021-01-07 DIAGNOSIS — O10013 Pre-existing essential hypertension complicating pregnancy, third trimester: Secondary | ICD-10-CM | POA: Diagnosis not present

## 2021-01-07 DIAGNOSIS — O34219 Maternal care for unspecified type scar from previous cesarean delivery: Secondary | ICD-10-CM | POA: Diagnosis not present

## 2021-01-07 DIAGNOSIS — Z3A31 31 weeks gestation of pregnancy: Secondary | ICD-10-CM | POA: Diagnosis not present

## 2021-01-07 LAB — GLUCOSE, CAPILLARY
Glucose-Capillary: 89 mg/dL (ref 70–99)
Glucose-Capillary: 90 mg/dL (ref 70–99)
Glucose-Capillary: 114 mg/dL — ABNORMAL HIGH (ref 70–99)
Glucose-Capillary: 112 mg/dL — ABNORMAL HIGH (ref 70–99)

## 2021-01-07 NOTE — Progress Notes (Addendum)
Antepartum Progress Note  Admission Date: 12/20/2020 Current Date: 01/07/2021  Kristina Mccann is a 30 y.o. G2P1001 HD#19 @ [redacted]w[redacted]d admitted for chronic hypertension with superimposed preeclampsia.   History complicated by: Patient Active Problem List   Diagnosis Date Noted  . [redacted] weeks gestation of pregnancy 01/05/2021  . Abnormal glucose affecting pregnancy 01/05/2021  . Chronic hypertension with superimposed preeclampsia 12/22/2020  . Elevated blood pressure affecting pregnancy in third trimester, antepartum 12/20/2020  . Pregnancy with history of cesarean section, antepartum 12/12/2020  . Marijuana user 09/18/2020  . Maternal morbid obesity in third trimester, antepartum (HCC) 09/08/2020  . BMI 40.0-44.9, adult (HCC) 09/08/2020  . Supervision of high risk pregnancy, antepartum 09/02/2020   ROS and patient/family/surgical history, located on admission H&P note dated 12/20/2020, have been reviewed, and there are no changes except as noted below  Yesterday/Overnight Events:  BPP on 5/26 8/8 NST daily, last one this am and reactive Labs 5/24, no deviations from her norm  Subjective:  Pt denies headache, blurry vision, CP, SOB, epigastric pain, or edema. She has frequent fetal movements and occasional discomforts related to that She expresses interest in outpatient management    She has occasional visitors, but most live out of town and cannot visit daily.  She face times regularly.  Objective:   Vitals:   01/06/21 1921 01/06/21 2321 01/07/21 0330 01/07/21 0752  BP: (!) 126/91 127/80 106/66 (!) 132/96  Pulse: (!) 122 (!) 111 (!) 105 (!) 111  Resp: 20 20 20 18   Temp: 98.6 F (37 C) 98.3 F (36.8 C) 98 F (36.7 C) 98.5 F (36.9 C)  TempSrc: Oral Oral Oral Oral  SpO2: 99% 98% 98% 100%  Weight:   100.7 kg   Height:       Physical exam: General appearance: alert, cooperative and appears stated age Abdomen: gravid, NT, ND GU: No gross VB Lungs: clear to auscultation  bilaterally Heart: regular rate and rhythm and no MRGs Extremities: no redness or tenderness in the calves or thighs, no edema Skin: no lesions Psych: appropriate  Recent Labs  Lab 01/01/21 0704 01/02/21 0831 01/03/21 1231  NA 135 133* 133*  K 4.3 4.2 4.1  CL 103 103 103  CO2 21* 20* 20*  BUN 11 12 11   CREATININE 0.66 0.57 0.80  GLUCOSE 102* 122* 132*   Recent Labs  Lab 01/02/21 0831 01/03/21 1231  WBC 6.8 7.9  HGB 11.3* 10.7*  HCT 33.8* 32.2*  PLT 304 277    Recent Labs  Lab 01/01/21 0704 01/02/21 0831 01/03/21 1231  CALCIUM 9.6 9.4 8.9   No results for input(s): INR, APTT in the last 168 hours.    Recent Labs  Lab 01/01/21 0704 01/02/21 0831 01/03/21 1231  ALKPHOS 80 82 75  BILITOT 0.3 0.3 0.2*  PROT 7.5 7.6 7.4  ALT 8 8 10   AST 14* 18 17    Assessment & Plan:   Problem List Items Addressed This Visit    Supervision of high risk pregnancy, antepartum    Monitor BS, although on current diet and activity she has mostly normal glc levels   Maternal morbid obesity in third trimester, antepartum (HCC)    Encourage ambulation    SCDs    Daily baby ASA    APT   Relevant Medications   calcium carbonate (TUMS - dosed in mg elemental calcium) chewable tablet 400 mg of elemental calcium   Marijuana user    Not using   Pregnancy with history  of cesarean section, antepartum    Planning CS     Delivery planning- 34 weeks based on preeclampsia diagnosis and management decisions, so will plan to schedule on or after June 12th (not yet scheduled)   Preeclampsia, third trimester       Procardai 30 mg daily, well controlled at this time    BPP twice weekly (Mon and Thurs)    NST daily    Labs weekly (Tues)    Delivery at 34 weeks    MFM weekly discussions   Relevant Medications (PRN mostly)   labetalol (NORMODYNE) injection 20 mg   labetalol (NORMODYNE) injection 40 mg   labetalol (NORMODYNE) injection 80 mg   hydrALAZINE (APRESOLINE) injection 10 mg    hydrALAZINE (APRESOLINE) injection 5 mg (Completed)   hydrALAZINE (APRESOLINE) 20 MG/ML injection (Completed)   NIFEdipine (PROCARDIA-XL/NIFEDICAL-XL) 24 hr tablet 30 mg (Completed)   NIFEdipine (PROCARDIA-XL/NIFEDICAL-XL) 24 hr tablet 30 mg   aspirin chewable tablet 81 mg   Other Relevant Orders   US OB Limited (Completed)   US FETAL BPP WO NON STRESS Weekly   Amniotic fluid index borderline low     Weekly checks      Relevant Orders   US OB Limited (Completed)  Consideration for outpatient management discussed.  Will involve MFM as this is not the norm for preeclampsia monitoring and management.  Risks of BP spikes, complications from high BP, and stillbirth to consider.  Encouraged ambulation, variety of walks and environments to see in hospital, and alternative entertainment options in room.  Expressed empathy in her prolonged stay.  Offered counseling, chaplain care.  I do not believe she is depressed clinically.  Will continue to monitor this discussion.   A total of 30 minutes were spent face-to-face with the patient as well as preparation, review, communication, and documentation during this encounter.   Annamarie Major, MD, [facility], [facility] 01/07/2021  8:39 AM

## 2021-01-07 NOTE — Progress Notes (Signed)
Tyson Babinski, RN here to do NST as per standing order.

## 2021-01-07 NOTE — Progress Notes (Signed)
Liana Crocker CNM here to see Pt. I also, informed M. Eunice Blase NNP that Pt. Has been consistently tachycardic since admission. NNP stated that Pt. Has been Tachycardic as per Pt.'s History prior to admission. M. Eunice Blase NNP stated she would talk to Dr. Cindi Carbon concerning this in A.M.

## 2021-01-07 NOTE — Progress Notes (Signed)
Patient placed on monitor at 0517 for NST, initial FHT 135. Patient reports +FM, denies contractions, vaginal bleeding, and LOF. NST reactive with moderate variability and 15x15 accelerations. Tracing placed in patient's chart with label.

## 2021-01-08 ENCOUNTER — Inpatient Hospital Stay: Payer: Medicaid Other

## 2021-01-08 ENCOUNTER — Encounter: Payer: Self-pay | Admitting: Obstetrics & Gynecology

## 2021-01-08 DIAGNOSIS — Z3A00 Weeks of gestation of pregnancy not specified: Secondary | ICD-10-CM | POA: Diagnosis not present

## 2021-01-08 DIAGNOSIS — O34219 Maternal care for unspecified type scar from previous cesarean delivery: Secondary | ICD-10-CM | POA: Diagnosis not present

## 2021-01-08 DIAGNOSIS — Z3A32 32 weeks gestation of pregnancy: Secondary | ICD-10-CM | POA: Diagnosis not present

## 2021-01-08 DIAGNOSIS — O10013 Pre-existing essential hypertension complicating pregnancy, third trimester: Secondary | ICD-10-CM | POA: Diagnosis not present

## 2021-01-08 DIAGNOSIS — O113 Pre-existing hypertension with pre-eclampsia, third trimester: Secondary | ICD-10-CM | POA: Diagnosis not present

## 2021-01-08 DIAGNOSIS — O1490 Unspecified pre-eclampsia, unspecified trimester: Secondary | ICD-10-CM | POA: Diagnosis not present

## 2021-01-08 DIAGNOSIS — O99810 Abnormal glucose complicating pregnancy: Secondary | ICD-10-CM | POA: Diagnosis not present

## 2021-01-08 LAB — GLUCOSE, CAPILLARY
Glucose-Capillary: 103 mg/dL — ABNORMAL HIGH (ref 70–99)
Glucose-Capillary: 110 mg/dL — ABNORMAL HIGH (ref 70–99)
Glucose-Capillary: 99 mg/dL (ref 70–99)
Glucose-Capillary: 148 mg/dL — ABNORMAL HIGH (ref 70–99)

## 2021-01-08 NOTE — Progress Notes (Signed)
Subjective: Patient reports feeling good this morning. She denies any headache, visual changes;, and her baby is moving well. She has not had her morning NST yet.  She also denies depression, despite  having to stay so long on the unit. Her BF stayed with her during the night last evening.She denies any vaginal bleeding, contractions or leaking of fluid.  Objective:  Ms. Rill is a 30 year old Gravida 2 Para 1, admitted on May 22nd at 30 + weeks gestation, with a diagnosis of chronic hypertension and superimposed pre eclampsia.  She has been maintained on oral Procardia, and her blood pressures have been stable over the last few days. Her daily NSTs are reactive, and she denies any headaches  I have reviewed patient's vital signs, medications and labs.  General: alert, cooperative, no distress and moderately obese Resp: clear to auscultation bilaterally Cardio: regular rhythm, but tachycardia noted. This has been her baseline heart rate (See EKG from 01/03/2021) GI: soft, non-tender; bowel sounds normal; no masses,  no organomegaly and gravid uterus. Extremities: extremities normal, atraumatic, no cyanosis or edema   Assessment/Plan: IUP 32 weeks Chronic hypertension with superimposed pre  Eclampsia BPP this morning ordered. Stable lab values with normal AST, ALT. Continues on Procardia 30 mg XL. Sinus tachycardia noted on EKG- will consult with Cardiology on Tuesday morning. Continue existing orders. Plan for MFM consultation in two days to determine POC, either continuing inpatient or outpatient with close follow up.  LOS: 17 days    Mirna Mires 01/08/2021, 10:48 AM

## 2021-01-08 NOTE — Progress Notes (Signed)
Patient placed on monitor at 1343 for NST. Fetal movement palpated and audible on monitor. Patient reports +FM. Denies ctx/lof/bleeding. Fetal heart baseline 135-140 with moderate variability and 15x15's. Monitors removed at 1423. Will notify CNM of NST.

## 2021-01-08 NOTE — Progress Notes (Signed)
Liana Crocker CNM notified that Pt. c/o "tightening" of upper abdomen and lower abdomen , intermittent, sharp pains. No vaginal bleeding and/or leaking of fluid or vaginal discharge. Abdomen is soft to palpation at this time. FHT is 146 by doppler. Denies any other PIH s/s. B/P is 126/78 and HR is regular at 107. No new orders received except to instruct Patient to drink 3 glasses of water. Pt. Instructed and she tolerated 3 glasses of water. Pt. Appears comfortable and in NAD.Instructed PT. To notify RN if she has any more contractions and/or abdominal pain.

## 2021-01-08 NOTE — Progress Notes (Signed)
Ardine Eng RN here to do NST as per standing order. Pt. Continues to c/o intermittent "tightening" of her upper Abdomen. No vaginal bleeding, leaking of fluid or vaginal discharge. M. Eunice Blase CNM was notified by Reinaldo Meeker RN of this continued c/o. M. Eunice Blase stated via text message that this is most likely due to [facility] and that Pt. should drink 3 more glasses of water. Pt. Instructed and v/o.

## 2021-01-08 NOTE — Progress Notes (Signed)
Patient placed on the monitor for NST at 2345. Initial FHT 148. Patient reports good fetal movement, denies contraction, bleeding or LOF. NST reactive for gestational age, moderate variability and 15 x 15 accelerations. Printed strip placed in patients chart with chart label.

## 2021-01-09 DIAGNOSIS — Z3A31 31 weeks gestation of pregnancy: Secondary | ICD-10-CM | POA: Diagnosis not present

## 2021-01-09 DIAGNOSIS — O10013 Pre-existing essential hypertension complicating pregnancy, third trimester: Secondary | ICD-10-CM | POA: Diagnosis not present

## 2021-01-09 DIAGNOSIS — Z3A32 32 weeks gestation of pregnancy: Secondary | ICD-10-CM | POA: Diagnosis not present

## 2021-01-09 DIAGNOSIS — O113 Pre-existing hypertension with pre-eclampsia, third trimester: Secondary | ICD-10-CM | POA: Diagnosis not present

## 2021-01-09 DIAGNOSIS — O34219 Maternal care for unspecified type scar from previous cesarean delivery: Secondary | ICD-10-CM | POA: Diagnosis not present

## 2021-01-09 DIAGNOSIS — O99810 Abnormal glucose complicating pregnancy: Secondary | ICD-10-CM | POA: Diagnosis not present

## 2021-01-09 LAB — GLUCOSE, CAPILLARY
Glucose-Capillary: 93 mg/dL (ref 70–99)
Glucose-Capillary: 132 mg/dL — ABNORMAL HIGH (ref 70–99)
Glucose-Capillary: 138 mg/dL — ABNORMAL HIGH (ref 70–99)
Glucose-Capillary: 92 mg/dL (ref 70–99)

## 2021-01-09 NOTE — Progress Notes (Signed)
Daily Antepartum Note  Admission Date: 12/20/2020 Current Date: 01/09/2021 1:35 PM  Kristina Mccann is a 30 y.o. G2P1001 @ [redacted]w[redacted]d by LMP (consistnet with 10 week [place]), HD#21, admitted for cHTN with superimposed preeclampsia, notable for labile blood pressure. Diagnosed during hospitalization with severe features.  Pregnancy complicated by:  Patient Active Problem List   Diagnosis Date Noted  . [redacted] weeks gestation of pregnancy 01/09/2021  . Abnormal glucose affecting pregnancy 01/05/2021  . Chronic hypertension with superimposed preeclampsia 12/22/2020  . Elevated blood pressure affecting pregnancy in third trimester, antepartum 12/20/2020  . Pregnancy with history of cesarean section, antepartum 12/12/2020  . Marijuana user 09/18/2020  . Maternal morbid obesity in third trimester, antepartum (HCC) 09/08/2020  . BMI 40.0-44.9, adult (HCC) 09/08/2020  . Supervision of high risk pregnancy, antepartum 09/02/2020  . Lives in sheltered housing 04/04/2018  . 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 B12 deficiency 03/22/2016  . Vitamin D deficiency 03/22/2016  . Acanthosis nigricans 04/11/2015  . Major depression, chronic (HCC) 04/11/2015  . Gastro-esophageal reflux disease without esophagitis 04/11/2015  . Anterior knee pain 04/11/2015  . H/O suicide attempt 04/11/2015  . Migraine without aura and responsive to treatment 04/11/2015  . Seasonal allergic rhinitis 04/11/2015    Overnight/24hr events:  None  Subjective:  Patient is resting comfortably in bed this AM. She reports an overall improvement in her mood since this provider's last visit (01/06/21). Patient does report two periods yesterday when she experienced tightening in her upper abdomen. These episodes occurred several hours apart, per patient report. She felt the sensation was similar to "[employer]." She reports +FM, denies VB, LOF. She denies HA, visions changes, or RUQ pain today.  Objective:   Vitals:   01/09/21 0836 01/09/21  1215  BP: (!) 134/92 (!) 120/91  Pulse: (!) 105 (!) 117  Resp: 18 20  Temp: 97.9 F (36.6 C) 98 F (36.7 C)  SpO2: 99%    Temp:  [97.8 F (36.6 C)-98.6 F (37 C)] 98 F (36.7 C) (05/30 1215) Pulse Rate:  [102-117] 117 (05/30 1215) Resp:  [18-48] 20 (05/30 1215) BP: (111-134)/(76-92) 120/91 (05/30 1215) SpO2:  [95 %-100 %] 99 % (05/30 0836) Weight:  [101 kg] 101 kg (05/30 0608) Temp (24hrs), Avg:98.2 F (36.8 C), Min:97.8 F (36.6 C), Max:98.6 F (37 C)   Intake/Output Summary (Last 24 hours) at 01/09/2021 1335 Last data filed at 01/09/2021 0930 Gross per 24 hour  Intake 120 ml  Output --  Net 120 ml     Current Vital Signs 24h Vital Sign Ranges  T 98 F (36.7 C) Temp  Avg: 98.2 F (36.8 C)  Min: 97.8 F (36.6 C)  Max: 98.6 F (37 C)  BP (!) 120/91 BP  Min: 111/91  Max: 134/92  HR (!) 117 Pulse  Avg: 109.3  Min: 102  Max: 117  RR 20 Resp  Avg: 23.7  Min: 18  Max: 48  SaO2 99 % Room Air SpO2  Avg: 98.4 %  Min: 95 %  Max: 100 %       24 Hour I/O Current Shift I/O  Time Ins Outs No intake/output data recorded. 05/30 0701 - 05/30 1900 In: 120 [P.O.:120] Out: -    Patient Vitals for the past 24 hrs:  BP Temp Temp src Pulse Resp SpO2 Weight  01/09/21 1215 (!) 120/91 98 F (36.7 C) Oral (!) 117 20 -- --  01/09/21 0836 (!) 134/92 97.9 F (36.6 C) Oral (!) 105 18 99 % --  01/09/21 5726 -- -- -- -- -- -- 101 kg  01/09/21 0335 133/76 98.5 F (36.9 C) Oral (!) 117 (!) 48 98 % --  01/08/21 2332 120/76 98.4 F (36.9 C) Oral (!) 108 18 100 % --  01/08/21 1927 126/78 98.6 F (37 C) Oral (!) 107 18 95 % --  01/08/21 1435 (!) 111/91 97.8 F (36.6 C) Oral (!) 102 20 100 % --    Physical exam: General: Well nourished, well developed female in no acute distress. Abdomen: gravid, non-tender Cardiovascular: S1, S2 normal, no murmur, rub or gallop, regular rhythm, elevated rate Respiratory: CTAB Extremities: no clubbing, cyanosis or edema Skin: Warm and dry.    Medications: Current Facility-Administered Medications  Medication Dose Route Frequency Provider Last Rate Last Admin  . acetaminophen (TYLENOL) tablet 1,000 mg  1,000 mg Oral Q6H PRN Vena Austria, MD   1,000 mg at 12/30/20 0949  . aspirin chewable tablet 81 mg  81 mg Oral Daily Mirna Mires, CNM   81 mg at 01/09/21 2035  . calcium carbonate (TUMS - dosed in mg elemental calcium) chewable tablet 400 mg of elemental calcium  2 tablet Oral TID PRN Zipporah Plants, CNM   400 mg of elemental calcium at 01/04/21 1553  . calcium carbonate (TUMS - dosed in mg elemental calcium) chewable tablet 400 mg of elemental calcium  400 mg of elemental calcium Oral BID Tresea Mall, CNM   400 mg of elemental calcium at 01/09/21 0942  . labetalol (NORMODYNE) injection 20 mg  20 mg Intravenous PRN Vena Austria, MD   20 mg at 12/22/20 5974   And  . labetalol (NORMODYNE) injection 40 mg  40 mg Intravenous PRN Vena Austria, MD   40 mg at 12/20/20 1534   And  . labetalol (NORMODYNE) injection 80 mg  80 mg Intravenous PRN Vena Austria, MD       And  . hydrALAZINE (APRESOLINE) injection 10 mg  10 mg Intravenous PRN Vena Austria, MD      . NIFEdipine (PROCARDIA-XL/NIFEDICAL-XL) 24 hr tablet 30 mg  30 mg Oral Daily Schuman, Christanna R, MD   30 mg at 01/09/21 1324  . prenatal multivitamin tablet 1 tablet  1 tablet Oral Q1200 Mirna Mires, CNM   1 tablet at 01/09/21 1638  . valACYclovir (VALTREX) tablet 1,000 mg  1,000 mg Oral Daily Vena Austria, MD   1,000 mg at 01/09/21 4536    Labs:  Recent Labs  Lab 01/03/21 1231  WBC 7.9  HGB 10.7*  HCT 32.2*  PLT 277    Recent Labs  Lab 01/03/21 1231  NA 133*  K 4.1  CL 103  CO2 20*  BUN 11  CREATININE 0.80  CALCIUM 8.9  PROT 7.4  BILITOT 0.2*  ALKPHOS 75  ALT 10  AST 17  GLUCOSE 132*     Radiology:  No additional imaging at this time - plan for BPP on 01/10/21  Assessment & Plan:   30 y.o. G2P1001 at [redacted]w[redacted]d  with cHTN with superimposed preeclampsia with severe features, history of cesarean delivery.  1) cHTN with superimposed preeclampsia:  -30 mg procardia XL daily -Twice weekly BPP -Daily NSTs -Weekly PIH labs per MFM rec -Continue hospitalization until 34 weeks  -Plan to be seen by MFM providers on 01/10/21 to review current  recommendations for care  2) Blood glucose              -Postprandial values normalized after initiating carb modified diet -Continue to  monitor  3) PPx: SCDs - patient reports use regularly throughout day             -Continue to encourage ambulation throughout the day   4) Disposition - continue current care  Zipporah Plants, CNM [facility], [facility] 01/09/2021 8:30 AM

## 2021-01-09 NOTE — Progress Notes (Signed)
Pt. Denies any occurrences of CTX's and/or any other abdominal pain.

## 2021-01-09 NOTE — Progress Notes (Signed)
Patient placed on monitor at 1357, initial FHT 135.  Fetal movement palpated and audible on monitor. Patient denies vaginal bleeding, leaking of fluid, or contractions, and states positive fetal movement. Pt did eat her lunch tray during her NST. NST was reactive and appropriate for gestational age- moderate variability with 15x15 accels and no decels. Pt did report  Braxton hicks over night and early morning but currently reports 0/10 pain.  Jobie Quaker, CNM reviewed tracing and pt returned to MB RM 340. Fernande Boyden, RN made aware.

## 2021-01-09 NOTE — Progress Notes (Signed)
NST Category 1 reactive. No fetal decelerations noted.Occasional UI, CNM made aware. Encouraged pt to drink water and notify M/B nurse if braxton hicks worsen or increase in intensity. Pt verbalized understanding.

## 2021-01-10 ENCOUNTER — Other Ambulatory Visit: Payer: Self-pay

## 2021-01-10 DIAGNOSIS — O10013 Pre-existing essential hypertension complicating pregnancy, third trimester: Secondary | ICD-10-CM | POA: Diagnosis not present

## 2021-01-10 DIAGNOSIS — R768 Other specified abnormal immunological findings in serum: Secondary | ICD-10-CM

## 2021-01-10 DIAGNOSIS — O34219 Maternal care for unspecified type scar from previous cesarean delivery: Secondary | ICD-10-CM | POA: Diagnosis not present

## 2021-01-10 DIAGNOSIS — O113 Pre-existing hypertension with pre-eclampsia, third trimester: Secondary | ICD-10-CM | POA: Diagnosis not present

## 2021-01-10 DIAGNOSIS — O99810 Abnormal glucose complicating pregnancy: Secondary | ICD-10-CM | POA: Diagnosis not present

## 2021-01-10 DIAGNOSIS — Z3A32 32 weeks gestation of pregnancy: Secondary | ICD-10-CM | POA: Diagnosis not present

## 2021-01-10 LAB — CBC WITH DIFFERENTIAL/PLATELET
Abs Immature Granulocytes: 0.07 10*3/uL (ref 0.00–0.07)
Basophils Absolute: 0 10*3/uL (ref 0.0–0.1)
Basophils Relative: 0 %
Eosinophils Absolute: 0.1 10*3/uL (ref 0.0–0.5)
Eosinophils Relative: 1 %
HCT: 29.5 % — ABNORMAL LOW (ref 36.0–46.0)
Hemoglobin: 10 g/dL — ABNORMAL LOW (ref 12.0–15.0)
Immature Granulocytes: 1 %
Lymphocytes Relative: 25 %
Lymphs Abs: 1.9 10*3/uL (ref 0.7–4.0)
MCH: 28.7 pg (ref 26.0–34.0)
MCHC: 33.9 g/dL (ref 30.0–36.0)
MCV: 84.5 fL (ref 80.0–100.0)
Monocytes Absolute: 0.6 10*3/uL (ref 0.1–1.0)
Monocytes Relative: 7 %
Neutro Abs: 5 10*3/uL (ref 1.7–7.7)
Neutrophils Relative %: 66 %
Platelets: 258 10*3/uL (ref 150–400)
RBC: 3.49 MIL/uL — ABNORMAL LOW (ref 3.87–5.11)
RDW: 13.2 % (ref 11.5–15.5)
WBC: 7.6 10*3/uL (ref 4.0–10.5)
nRBC: 0 % (ref 0.0–0.2)

## 2021-01-10 LAB — COMPREHENSIVE METABOLIC PANEL
ALT: 10 U/L (ref 0–44)
AST: 15 U/L (ref 15–41)
Albumin: 2.7 g/dL — ABNORMAL LOW (ref 3.5–5.0)
Alkaline Phosphatase: 70 U/L (ref 38–126)
Anion gap: 10 (ref 5–15)
BUN: 10 mg/dL (ref 6–20)
CO2: 20 mmol/L — ABNORMAL LOW (ref 22–32)
Calcium: 8.9 mg/dL (ref 8.9–10.3)
Chloride: 104 mmol/L (ref 98–111)
Creatinine, Ser: 0.64 mg/dL (ref 0.44–1.00)
GFR, Estimated: >60 mL/min (ref >60)
Glucose, Bld: 94 mg/dL (ref 70–99)
Potassium: 3.8 mmol/L (ref 3.5–5.1)
Sodium: 134 mmol/L — ABNORMAL LOW (ref 135–145)
Total Bilirubin: 0.4 mg/dL (ref 0.3–1.2)
Total Protein: 7 g/dL (ref 6.5–8.1)

## 2021-01-10 LAB — GROUP B STREP BY PCR: Group B strep by PCR: NEGATIVE

## 2021-01-10 LAB — PHOSPHORUS: Phosphorus: 6 mg/dL — ABNORMAL HIGH (ref 2.5–4.6)

## 2021-01-10 LAB — MAGNESIUM: Magnesium: 1.6 mg/dL — ABNORMAL LOW (ref 1.7–2.4)

## 2021-01-10 LAB — GLUCOSE, CAPILLARY
Glucose-Capillary: 93 mg/dL (ref 70–99)
Glucose-Capillary: 108 mg/dL — ABNORMAL HIGH (ref 70–99)
Glucose-Capillary: 103 mg/dL — ABNORMAL HIGH (ref 70–99)

## 2021-01-10 LAB — PROTEIN / CREATININE RATIO, URINE
Creatinine, Urine: 181 mg/dL
Protein Creatinine Ratio: 0.11 mg/mg{Cre} (ref 0.00–0.15)
Total Protein, Urine: 20 mg/dL

## 2021-01-10 MED ORDER — NIFEDIPINE ER 30 MG PO TB24: 30.0000 mg | ORAL_TABLET | Freq: Every day | ORAL | 1 refills | Status: DC

## 2021-01-10 MED ORDER — VALACYCLOVIR HCL 1 G PO TABS: 1000.0000 mg | ORAL_TABLET | Freq: Every day | ORAL | 1 refills | Status: DC

## 2021-01-10 MED ORDER — NIFEDIPINE ER 30 MG PO TB24
30.0000 mg | ORAL_TABLET | Freq: Every day | ORAL | 1 refills | Status: DC
  Filled 2021-01-10: qty 30, 30d supply, fill #0

## 2021-01-10 NOTE — Progress Notes (Signed)
Pt discharged home.  Discharge instructions, prescriptions and follow up appointment given to and reviewed with pt.  Pt verbalized understanding.  Escorted by auxillary. 

## 2021-01-10 NOTE — Consult Note (Signed)
Maternal-Fetal Medicine (Follow-up Consultation)  Maternal-Fetal Medicine   I had the pleasure of seeing [name] today. She was admitted to L&D, [facility] on 01/01/21 with severe hypertension.  She was admitted on 12/20/20 with severe range blood pressures and received IV labetalol treatment. Initially, nifedipine dosage was increased to 90 mg daily and then reduced to 30 mg daily because of hypotension. She had received antenatal corticosteroids.  Her blood pressures have been normal to mild hypertensive range. She does not have severe headache or visual disturbances or right upper quadrant pain or vaginal bleeding. She reports good fetal movements.   Ultrasound performed on 12/19/20, showed normal fetal growth (36th percentile) and antenatal testing has been reassuring so far.  Prenatal course: Her prenatal course before admission has been uneventful. Obstetric history is significant for a term cesarean delivery (primary genital herpes infection) in 2012 of a female infant weighing 6-6 at birth. Her daughter is in good health.  Past medical history: Patient has a diagnosis of chronic hypertension. She was not taking antihypertensives in early pregnancy. She does not have diabetes.  PSH: Cesarean section, breast lumpectomy. Allergies; NKDA. Family: No history of venous thromboembolism. Medications: Nifedipine XL 30 mg daily, prenatal vitamins, aspirin 81 mg daily, Valtrex 1,000 mg daily.  Labs: Hb 10, Hct 29.5, PLT 250, WBC 7.6, electrolytes normal, AST 15, ALT 10, creatinine 0.64. Total urine protein 1,032 mg/24 hours.  P/E: Patient is comfortably lying in bed; not in distress. BP (24 hours): 111-134/82-92 mm Hg. HEENT normal Minimal pedal edema. Abdomen: Soft gravid uterus, no tenderness. Reflexes normal.  Our concerns include: Superimposed preeclampsia Patient has a diagnosis of superimposed preeclampsia with severe features and acute hypertension treatment at admission was  necessary. However, subsequent blood pressures have been normal to mild hypertensive range with low dose nifedipine.  She does not have symptoms and signs of severe features of preeclampsia.  Fetal testing including NST have been reassuring so far.  I counseled the patient on the possible complications of severe preeclampsia including stroke, eclampsia, pulmonary edema, renal failure, coagulation disturbances and placental abruption. If the diagnosis is certain, delivery is recommended at 34 weeks to prevent maternal complications.  In some cases, the diagnosis can be challenging. Her blood pressures were not in the severe range for more than 2 weeks and she does not have symptoms suggestive of severe features. Given that she had normal to mild hypertension and that she has not had symptoms of severe features, outpatient management is reasonable.  Patient should be able to monitor her blood pressure at home at least twice daily initially and then as per your advice. She is aware of the symptoms that should prompt her to come to the hospital.  Patient lives 6 miles from the hospital and has home support to bring her to the hospital in emergency.  Timing of delivery: If her blood pressures are not in the severe range and she does not have symptoms of severe features, delivery can be deferred to 37 weeks. However, her clinical and laboratory course will influence our decision.  Recommendations -Consider discharge home. -Home blood pressure monitoring. -BP at your office in 2 to 3 days and then weekly. -Weekly BPP or NST. -Fetal growth assessment in 7 to 10 days. -Repeat labs in 1 week. -Patient should be admitted if severe range hypertension (SBP ?160 and/or DBP ?110) occurs at home or at your office and inpatient management should continue till 34 weeks, when delivery is indicated.  Delivery is indicated (not  limited to) for following reasons:  -Persistent and uncontrolled hypertension not  responding to antihypertensives  -Abnormal labs including increasing liver enzymes or thrombocytopenia (<100,000).  -Non-reassuring fetal heart trace.  -Oligohydramnios or abnormal antenatal testings.  -Oliguria (hourly output 20 mL to 30 mL).  -Increasing creatinine (?1.1) Severe symptoms of headache or visual spots or persistent right upper quadrant pain.  -Placental abruption.  Thank you for consultation. If you have any questions, please contact me that Maternal Fetal Care [facility]. Consultation including face-to-face counseling 35 minutes.

## 2021-01-10 NOTE — Progress Notes (Signed)
Pt placed on monitors for NST. Fetal tracing was reactive with good variability. Audible movements and hiccups were present. PT denies CTX, LOF and VB. Pt states positive fetal movement.

## 2021-01-10 NOTE — Discharge Summary (Signed)
Physician Final Progress Note  Patient ID: Kristina Mccann MRN: [id number] DOB/AGE: [birth year] 30 y.o.  Admit date: 12/20/2020 Admitting provider: Nadara Mustard, MD Discharge date: 01/10/2021   Admission Diagnoses: mild range elevated blood pressure third trimester in clinic with severe range BP in triage  Discharge Diagnoses:  Active Problems:   HSV-2 seropositive   Supervision of high risk pregnancy, antepartum   Pregnancy with history of cesarean section, antepartum   Elevated blood pressure affecting pregnancy in third trimester, antepartum   Chronic hypertension with superimposed preeclampsia   Abnormal glucose affecting pregnancy   [redacted] weeks gestation of pregnancy LOS HD#22  History of Present Illness: The patient is a 30 y.o. female G2P1001 at [redacted]w[redacted]d who was admitted 3 weeks ago with elevated blood pressure from clinic. She was found to have severe range and labile blood pressures initially. She received IV Labetalol treatment, course of betamethasone. Procardia dosing adjusted over the first week of admission due to labile BPs. Collaborative planning with MFM. Patient remained stable for the past 2 weeks without severe range blood pressure and generally good control with Procardia 30 mg XL daily. Daily NSTs have been reactive. Labs stable during admission. Patient denies headache, visual changes, epigastric pain. She admits good fetal movement and denies contractions, vaginal bleeding or leakage of fluid. Due to elevated blood sugar on CMP, daily glucose checks were initiated 1 week ago with largely normal results.  Per consult today with Dr Judeth Cornfield she is discharged to home with instructions for BP check 2 times daily on home cuff, weekly ultrasound arranged by MFM- growth next week and BPP weekly after that, weekly [facility] clinic visits for BP/ROB/NST/PIH labs. Precautions reviewed with patient.   Past Medical History:  Diagnosis Date  . Acanthosis nigricans   . Allergy   .  Anxiety   . Depression   . Dysmenorrhea   . GERD (gastroesophageal reflux disease)   . History of suicide attempt   . Migraine without status migrainosus, not intractable   . Obesity     Past Surgical History:  Procedure Laterality Date  . BREAST LUMPECTOMY Left 2008  . CESAREAN SECTION      No current facility-administered medications on file prior to encounter.   Current Outpatient Medications on File Prior to Encounter  Medication Sig Dispense Refill  . Prenatal Vit-Fe Fumarate-FA (PRENATAL MULTIVITAMIN) TABS tablet Take 1 tablet by mouth daily at 12 noon.    . metoCLOPramide (REGLAN) 10 MG tablet Take 1 tablet (10 mg total) by mouth every 8 (eight) hours as needed for up to 3 days for nausea. 30 tablet 0  . ondansetron (ZOFRAN ODT) 4 MG disintegrating tablet Take 1 tablet (4 mg total) by mouth every 6 (six) hours as needed for nausea. (Patient not taking: Reported on 12/20/2020) 20 tablet 0    No Known Allergies  Social History   Socioeconomic History  . Marital status: Single    Spouse name: Not on file  . Number of children: 1  . Years of education: Not on file  . Highest education level: High school graduate  Occupational History  . Occupation: Case Production designer, theatre/television/film    Comment: Sports coach  . Occupation: Conservation officer, nature    Comment: Patent examiner  Tobacco Use  . Smoking status: Former Smoker    Years: 3.00    Types: Cigarettes    Start date: 04/10/2012  . Smokeless tobacco: Never Used  . Tobacco comment: 1-2 black and milds a day  Vaping Use  .  Vaping Use: Former  Substance and Sexual Activity  . Alcohol use: No    Alcohol/week: 0.0 standard drinks  . Drug use: Yes    Types: Marijuana    Comment: 1-2x day  . Sexual activity: Yes    Partners: Male    Birth control/protection: None  Other Topics Concern  . Not on file  Social History Narrative  . Not on file   Social Determinants of Health   Financial Resource Strain: Not on file  Food Insecurity:  Not on file  Transportation Needs: Not on file  Physical Activity: Not on file  Stress: Not on file  Social Connections: Not on file  Intimate Partner Violence: Not on file    Family History  Problem Relation Age of Onset  . Migraines Mother   . Stroke Father   . Diabetes Father   . Obesity Father   . Hypertension Father   . Asthma Brother   . Asthma Brother   . Asthma Brother   . Diabetes Maternal Grandmother   . Hypertension Maternal Grandmother      Review of Systems  Constitutional: Negative for chills and fever.  HENT: Negative for congestion, ear discharge, ear pain, hearing loss, sinus pain and sore throat.   Eyes: Negative for blurred vision and double vision.  Respiratory: Negative for cough, shortness of breath and wheezing.   Cardiovascular: Negative for chest pain, palpitations and leg swelling.  Gastrointestinal: Negative for abdominal pain, blood in stool, constipation, diarrhea, heartburn, melena, nausea and vomiting.  Genitourinary: Negative for dysuria, flank pain, frequency, hematuria and urgency.  Musculoskeletal: Negative for back pain, joint pain and myalgias.  Skin: Negative for itching and rash.  Neurological: Negative for dizziness, tingling, tremors, sensory change, speech change, focal weakness, seizures, loss of consciousness, weakness and headaches.  Endo/Heme/Allergies: Negative for environmental allergies. Does not bruise/bleed easily.  Psychiatric/Behavioral: Negative for depression, hallucinations, memory loss, substance abuse and suicidal ideas. The patient is not nervous/anxious and does not have insomnia.      Physical Exam: BP 102/62 (BP Location: Left Arm)   Pulse (!) 109   Temp 98.2 F (36.8 C) (Oral)   Resp 20   Ht 5\' 2"  (1.575 m)   Wt 100.9 kg   LMP 05/29/2020 Comment: approx [redacted] weeks pregnant   SpO2 100%   BMI 40.68 kg/m   Constitutional: Well nourished, well developed female in no acute distress.  HEENT: normal Skin: Warm and  dry.  Cardiovascular: Regular rate and rhythm.   Extremity: trace edema  Respiratory: Clear to auscultation bilateral. Normal respiratory effort Abdomen: FHT present Back: no CVAT Neuro: DTRs 2+, Cranial nerves grossly intact Psych: Alert and Oriented x3. No memory deficits. Normal mood and affect.   Toco: negative Daily NSTs have been reactive- most recent result earlier today 135 bpm baseline, moderate variability, +accelerations, -decelerations  Consults: cardiology consult was placed due to tachycardia over the weekend, although patient is to be discharged to home today per consult with Dr 05/31/2020. Patient can be seen by cardiology outpatient as needed.  Significant Findings/ Diagnostic Studies: labs:  Results for NAZIAH, PORTEE (MRN [name]) as of 01/10/2021 13:14  Ref. Range 01/10/2021 05:14 01/10/2021 05:39 01/10/2021 11:01 01/10/2021 11:26  Glucose-Capillary Latest Ref Range: 70 - 99 mg/dL  93 01/12/2021 (H)   COMPREHENSIVE METABOLIC PANEL Unknown Rpt (A)     Sodium Latest Ref Range: 135 - 145 mmol/L 134 (L)     Potassium Latest Ref Range: 3.5 - 5.1 mmol/L 3.8  Chloride Latest Ref Range: 98 - 111 mmol/L 104     CO2 Latest Ref Range: 22 - 32 mmol/L 20 (L)     Glucose Latest Ref Range: 70 - 99 mg/dL 94     BUN Latest Ref Range: 6 - 20 mg/dL 10     Creatinine Latest Ref Range: 0.44 - 1.00 mg/dL 4.40     Calcium Latest Ref Range: 8.9 - 10.3 mg/dL 8.9     Anion gap Latest Ref Range: 5 - 15  10     Phosphorus Latest Ref Range: 2.5 - 4.6 mg/dL 6.0 (H)     Magnesium Latest Ref Range: 1.7 - 2.4 mg/dL 1.6 (L)     Alkaline Phosphatase Latest Ref Range: 38 - 126 U/L 70     Albumin Latest Ref Range: 3.5 - 5.0 g/dL 2.7 (L)     AST Latest Ref Range: 15 - 41 U/L 15     ALT Latest Ref Range: 0 - 44 U/L 10     Total Protein Latest Ref Range: 6.5 - 8.1 g/dL 7.0     Total Bilirubin Latest Ref Range: 0.3 - 1.2 mg/dL 0.4     GFR, Estimated Latest Ref Range: >60 mL/min >60     WBC Latest Ref  Range: 4.0 - 10.5 K/uL 7.6     RBC Latest Ref Range: 3.87 - 5.11 MIL/uL 3.49 (L)     Hemoglobin Latest Ref Range: 12.0 - 15.0 g/dL 10.2 (L)     HCT Latest Ref Range: 36.0 - 46.0 % 29.5 (L)     MCV Latest Ref Range: 80.0 - 100.0 fL 84.5     MCH Latest Ref Range: 26.0 - 34.0 pg 28.7     MCHC Latest Ref Range: 30.0 - 36.0 g/dL 72.5     RDW Latest Ref Range: 11.5 - 15.5 % 13.2     Platelets Latest Ref Range: 150 - 400 K/uL 258     nRBC Latest Ref Range: 0.0 - 0.2 % 0.0     Neutrophils Latest Units: % 66     Lymphocytes Latest Units: % 25     Monocytes Relative Latest Units: % 7     Eosinophil Latest Units: % 1     Basophil Latest Units: % 0     Immature Granulocytes Latest Units: % 1     NEUT# Latest Ref Range: 1.7 - 7.7 K/uL 5.0     Lymphocyte # Latest Ref Range: 0.7 - 4.0 K/uL 1.9     Monocyte # Latest Ref Range: 0.1 - 1.0 K/uL 0.6     Eosinophils Absolute Latest Ref Range: 0.0 - 0.5 K/uL 0.1     Basophils Absolute Latest Ref Range: 0.0 - 0.1 K/uL 0.0     Abs Immature Granulocytes Latest Ref Range: 0.00 - 0.07 K/uL 0.07     Group B strep by PCR Latest Ref Range: NEGATIVE     NEGATIVE    Procedures: daily NSTs  Hospital Course: The patient was admitted to Labor and Delivery Triage for observation.   Discharge Condition: good  Disposition: Discharge disposition: 01-Home or Self Care  Diet: Healthy pregnancy diet encouraged- limit simple carbohydrates  Discharge Activity: Activity as tolerated  Discharge Instructions    Discharge activity:  No Restrictions   Complete by: As directed    Discharge diet:   Complete by: As directed    Healthy pregnancy diet, carb modified- eat complex carbohydrates/limit simple carbohydrates   Fetal Kick Count:  Lie on  our left side for one hour after a meal, and count the number of times your baby kicks.  If it is less than 5 times, get up, move around and drink some juice.  Repeat the test 30 minutes later.  If it is still less than 5 kicks in an  hour, notify your doctor.   Complete by: As directed    No sexual activity restrictions   Complete by: As directed    Notify physician for a general feeling that "something is not right"   Complete by: As directed    Notify physician for increase or change in vaginal discharge   Complete by: As directed    Notify physician for intestinal cramps, with or without diarrhea, sometimes described as "gas pain"   Complete by: As directed    Notify physician for leaking of fluid   Complete by: As directed    Notify physician for low, dull backache, unrelieved by heat or Tylenol   Complete by: As directed    Notify physician for menstrual like cramps   Complete by: As directed    Notify physician for pelvic pressure   Complete by: As directed    Notify physician for uterine contractions.  These may be painless and feel like the uterus is tightening or the baby is  "balling up"   Complete by: As directed    Notify physician for vaginal bleeding   Complete by: As directed    PRETERM LABOR:  Includes any of the follwing symptoms that occur between 20 - [redacted] weeks gestation.  If these symptoms are not stopped, preterm labor can result in preterm delivery, placing your baby at risk   Complete by: As directed      Allergies as of 01/10/2021   No Known Allergies     Medication List    STOP taking these medications   metoCLOPramide 10 MG tablet Commonly known as: REGLAN   ondansetron 4 MG disintegrating tablet Commonly known as: Zofran ODT     TAKE these medications   NIFEdipine 30 MG 24 hr tablet Commonly known as: ADALAT CC Take 1 tablet (30 mg total) by mouth daily.   prenatal multivitamin Tabs tablet Take 1 tablet by mouth daily at 12 noon.   valACYclovir 1000 MG tablet Commonly known as: VALTREX Take 1 tablet (1,000 mg total) by mouth daily. Start taking on: January 11, 2021       Follow-up Information    [facility] Go in 2 day([facility]).   Specialty: Obstetrics and  Gynecology Why: weekly [facility] visit for rob/bp/NST/pih labs also go to weekly visit with mfm/radiology as scheduled for growth/bpp Contact information: [address] [place] [phone]             Patient declined Meds to [facility]  Total time spent taking care of this patient: 40 minutes  Signed: Tresea MallJane Jahmiyah Dullea, CNM  01/10/2021, 12:51 PM

## 2021-01-11 ENCOUNTER — Telehealth: Payer: Self-pay | Admitting: *Deleted

## 2021-01-11 DIAGNOSIS — Z419 Encounter for procedure for purposes other than remedying health state, unspecified: Secondary | ICD-10-CM | POA: Diagnosis not present

## 2021-01-11 NOTE — Telephone Encounter (Signed)
Transition Care Management Follow-up Telephone Call  Date of discharge and from where: 01/10/2021 [facility]  How have you been since you were released from the hospital? "Doing fine"  Any questions or concerns? No  Items Reviewed:  Did the pt receive and understand the discharge instructions provided? Yes   Medications obtained and verified? Yes   Other? No   Any new allergies since your discharge? No   Dietary orders reviewed? No  Do you have support at home? Yes    Functional Questionnaire: (I = Independent and D = Dependent) ADLs: I  Bathing/Dressing- I  Meal Prep- I  Eating- I  Maintaining continence- I  Transferring/Ambulation- I  Managing Meds- I  Follow up appointments reviewed:   PCP Hospital f/u appt confirmed? No    Specialist Hospital f/u appt confirmed? Yes  Scheduled to see OBGYN on 01/12/2021 @ 1350.  Are transportation arrangements needed? No   If their condition worsens, is the pt aware to call PCP or go to the Emergency Dept.? Yes  Was the patient provided with contact information for the PCP's office or ED? Yes  Was to pt encouraged to call back with questions or concerns? Yes

## 2021-01-12 ENCOUNTER — Ambulatory Visit (INDEPENDENT_AMBULATORY_CARE_PROVIDER_SITE_OTHER): Payer: Medicaid Other | Admitting: Obstetrics and Gynecology

## 2021-01-12 ENCOUNTER — Other Ambulatory Visit: Payer: Self-pay

## 2021-01-12 ENCOUNTER — Encounter: Payer: Medicaid Other | Admitting: Obstetrics and Gynecology

## 2021-01-12 VITALS — BP 126/72 | Wt 228.0 lb

## 2021-01-12 DIAGNOSIS — Z3A32 32 weeks gestation of pregnancy: Secondary | ICD-10-CM

## 2021-01-12 DIAGNOSIS — O0993 Supervision of high risk pregnancy, unspecified, third trimester: Secondary | ICD-10-CM

## 2021-01-12 DIAGNOSIS — O1493 Unspecified pre-eclampsia, third trimester: Secondary | ICD-10-CM

## 2021-01-12 LAB — FETAL NONSTRESS TEST

## 2021-01-16 ENCOUNTER — Other Ambulatory Visit: Payer: Self-pay | Admitting: Obstetrics and Gynecology

## 2021-01-16 DIAGNOSIS — O99213 Obesity complicating pregnancy, third trimester: Secondary | ICD-10-CM

## 2021-01-16 DIAGNOSIS — O10013 Pre-existing essential hypertension complicating pregnancy, third trimester: Secondary | ICD-10-CM

## 2021-01-17 ENCOUNTER — Other Ambulatory Visit: Payer: Self-pay

## 2021-01-17 ENCOUNTER — Ambulatory Visit: Payer: Medicaid Other | Attending: Obstetrics and Gynecology

## 2021-01-17 ENCOUNTER — Other Ambulatory Visit: Payer: Self-pay | Admitting: Obstetrics and Gynecology

## 2021-01-17 DIAGNOSIS — O113 Pre-existing hypertension with pre-eclampsia, third trimester: Secondary | ICD-10-CM

## 2021-01-17 DIAGNOSIS — Z3A33 33 weeks gestation of pregnancy: Secondary | ICD-10-CM

## 2021-01-17 DIAGNOSIS — O10013 Pre-existing essential hypertension complicating pregnancy, third trimester: Secondary | ICD-10-CM

## 2021-01-17 DIAGNOSIS — O99213 Obesity complicating pregnancy, third trimester: Secondary | ICD-10-CM | POA: Diagnosis not present

## 2021-01-17 DIAGNOSIS — O0990 Supervision of high risk pregnancy, unspecified, unspecified trimester: Secondary | ICD-10-CM

## 2021-01-19 ENCOUNTER — Other Ambulatory Visit: Payer: Self-pay

## 2021-01-19 ENCOUNTER — Encounter: Payer: Self-pay | Admitting: Advanced Practice Midwife

## 2021-01-19 ENCOUNTER — Ambulatory Visit (INDEPENDENT_AMBULATORY_CARE_PROVIDER_SITE_OTHER): Payer: Medicaid Other | Admitting: Advanced Practice Midwife

## 2021-01-19 VITALS — BP 120/78 | Wt 230.0 lb

## 2021-01-19 DIAGNOSIS — O0990 Supervision of high risk pregnancy, unspecified, unspecified trimester: Secondary | ICD-10-CM | POA: Diagnosis not present

## 2021-01-19 DIAGNOSIS — O119 Pre-existing hypertension with pre-eclampsia, unspecified trimester: Secondary | ICD-10-CM | POA: Diagnosis not present

## 2021-01-19 DIAGNOSIS — O99213 Obesity complicating pregnancy, third trimester: Secondary | ICD-10-CM

## 2021-01-19 DIAGNOSIS — Z3A33 33 weeks gestation of pregnancy: Secondary | ICD-10-CM

## 2021-01-19 LAB — POCT URINALYSIS DIPSTICK OB: Glucose, UA: NEGATIVE

## 2021-01-19 LAB — FETAL NONSTRESS TEST

## 2021-01-19 NOTE — Progress Notes (Signed)
ROB - NST, no concerns. RM 6 

## 2021-01-19 NOTE — Progress Notes (Signed)
Routine Prenatal Care Visit  Subjective  Kristina Mccann is a 30 y.o. G2P1001 at [redacted]w[redacted]d being seen today for ongoing prenatal care.  She is currently monitored for the following issues for this high-risk pregnancy and has Acanthosis nigricans; Major depression, chronic (HCC); Gastro-esophageal reflux disease without esophagitis; Anterior knee pain; H/O suicide attempt; Migraine without aura and responsive to treatment; Seasonal allergic rhinitis; HSV-2 seropositive; B12 deficiency; Vitamin D deficiency; Lives in sheltered housing; Supervision of high risk pregnancy, antepartum; Maternal morbid obesity in third trimester, antepartum (HCC); BMI 40.0-44.9, adult (HCC); Marijuana user; Pregnancy with history of cesarean section, antepartum; Elevated blood pressure affecting pregnancy in third trimester, antepartum; Chronic hypertension with superimposed preeclampsia; Abnormal glucose affecting pregnancy; and [redacted] weeks gestation of pregnancy on their problem list.  ----------------------------------------------------------------------------------- Patient reports no complaints.  She denies headache, visual changes or epigastric pain. She is taking Nifedipine and Valtrex as prescribed. Contractions: Not present. Vag. Bleeding: None.  Movement: Present. Leaking Fluid denies.  ----------------------------------------------------------------------------------- The following portions of the patient's history were reviewed and updated as appropriate: allergies, current medications, past family history, past medical history, past social history, past surgical history and problem list. Problem list updated.  Objective  Blood pressure 120/78, weight 230 lb (104.3 kg), last menstrual period 05/29/2020. Pregravid weight 220 lb (99.8 kg) Total Weight Gain 10 lb (4.536 kg) Urinalysis: Urine Protein Small (1+)  Urine Glucose Negative  Fetal Status: Fetal Heart Rate (bpm): 140   Movement: Present     Reactive NST: 30  minute tracing, 140 bpm, moderate variability, +accelerations, -decelerations  General:  Alert, oriented and cooperative. Patient is in no acute distress.  Skin: Skin is warm and dry. No rash noted.   Cardiovascular: Normal heart rate noted  Respiratory: Normal respiratory effort, no problems with respiration noted  Abdomen: Soft, gravid, appropriate for gestational age. Pain/Pressure: Absent     Pelvic:  Cervical exam deferred        Extremities: Normal range of motion.  Edema: Trace  Mental Status: Normal mood and affect. Normal behavior. Normal judgment and thought content.   Assessment   30 y.o. G2P1001 at [redacted]w[redacted]d by  03/05/2021, by Last Menstrual Period presenting for routine prenatal visit  Plan   SECOND Problems (from 09/02/20 to present)     Problem Noted Resolved   Chronic hypertension with superimposed preeclampsia 12/22/2020 by Tresea Mall, CNM No   Pregnancy with history of cesarean section, antepartum 12/12/2020 by Conard Novak, MD No   Marijuana user 09/18/2020 by Mirna Mires, CNM No   Overview Signed 09/18/2020  4:48 PM by Mirna Mires, CNM    + testing ather new OB appointment 09/2020       Maternal morbid obesity in third trimester, antepartum (HCC) 09/08/2020 by Zipporah Plants, CNM No   Overview Signed 09/08/2020 11:42 AM by Zipporah Plants, CNM    BMI >=40 [ ]  early 1h gtt -  [ ]  screen sleep apnea [ ]  anesthesia consult (early and late if BMI > 45) [x]  u/s for dating [x]  nutritional goals [ ]  folic acid 1mg  [ ]  bASA (>12 weeks) [ ]  consider nutrition consult [ ]  consider maternal EKG 1st trimester [ ]  Growth u/s 28 [ ] , 32 [ ] , 36 weeks [ ]  [ ]  NST/AFI weekly 34+ weeks (34[] ,35[] ,36[] , 37[] , 38[] , 39[] , 40[] ) [ ]  IOL by 41 weeks (scheduled, prn [] )        Supervision of high risk pregnancy, antepartum 09/02/2020 by , CNM No  Overview Addendum 10/19/2020  9:02 PM by Mirna Mires, CNM     Nursing Staff Provider  Office  Location  [facility] Dating   LMP = 14w [place]. Dating sono CW LMP  Language  English Anatomy US  Normal anatomy, female  Flu Vaccine   Genetic Screen  NIPS:   Negx3, XX  SMA negative  TDaP vaccine    Hgb A1C or  GTT  A1C: Third trimester :   Rhogam     LAB RESULTS   Feeding Plan  Blood Type   A+  Contraception  Antibody  negative  Circumcision  NA Rubella  non immune  Pediatrician   RPR   NR  Support Person  HBsAg   negative  Prenatal Classes  HIV  NR    Varicella Non immune  BTL Consent  GBS  (For PCN allergy, check sensitivities)        VBAC Consent  Pap  NILM    Hgb Electro      CF      SMA         Marijuana + at NOB            Preterm labor symptoms and general obstetric precautions including but not limited to vaginal bleeding, contractions, leaking of fluid and fetal movement were reviewed in detail with the patient.   Return in about 1 week (around 01/26/2021) for HROB NST/PIH labs and rob weekly for 3 weeks.  Tresea Mall, CNM 01/19/2021 4:06 PM

## 2021-01-20 LAB — COMPREHENSIVE METABOLIC PANEL
ALT: 10 IU/L (ref 0–32)
AST: 19 IU/L (ref 0–40)
Albumin/Globulin Ratio: 1.1 — ABNORMAL LOW (ref 1.2–2.2)
Albumin: 3.6 g/dL — ABNORMAL LOW (ref 3.9–5.0)
Alkaline Phosphatase: 104 IU/L (ref 44–121)
BUN/Creatinine Ratio: 12 (ref 9–23)
BUN: 9 mg/dL (ref 6–20)
Bilirubin Total: <0.2 mg/dL (ref 0.0–1.2)
CO2: 19 mmol/L — ABNORMAL LOW (ref 20–29)
Calcium: 9.4 mg/dL (ref 8.7–10.2)
Chloride: 105 mmol/L (ref 96–106)
Creatinine, Ser: 0.77 mg/dL (ref 0.57–1.00)
Globulin, Total: 3.2 g/dL (ref 1.5–4.5)
Glucose: 95 mg/dL (ref 65–99)
Potassium: 4.2 mmol/L (ref 3.5–5.2)
Sodium: 139 mmol/L (ref 134–144)
Total Protein: 6.8 g/dL (ref 6.0–8.5)
eGFR: 107 mL/min/{1.73_m2} (ref >59)

## 2021-01-20 LAB — CBC
Hematocrit: 32.4 % — ABNORMAL LOW (ref 34.0–46.6)
Hemoglobin: 10.3 g/dL — ABNORMAL LOW (ref 11.1–15.9)
MCH: 27 pg (ref 26.6–33.0)
MCHC: 31.8 g/dL (ref 31.5–35.7)
MCV: 85 fL (ref 79–97)
Platelets: 314 10*3/uL (ref 150–450)
RBC: 3.82 x10E6/uL (ref 3.77–5.28)
RDW: 14.2 % (ref 11.7–15.4)
WBC: 7.3 10*3/uL (ref 3.4–10.8)

## 2021-01-20 LAB — PROTEIN / CREATININE RATIO, URINE
Creatinine, Urine: 557.6 mg/dL
Protein, Ur: 89.6 mg/dL
Protein/Creat Ratio: 161 mg/g creat (ref 0–200)

## 2021-01-23 NOTE — Progress Notes (Signed)
Routine Prenatal Care Visit  Subjective  Kristina Mccann is a 30 y.o. G2P1001 at [redacted]w[redacted]d being seen today for ongoing prenatal care.  She is currently monitored for the following issues for this high-risk pregnancy and has Acanthosis nigricans; Major depression, chronic (HCC); Gastro-esophageal reflux disease without esophagitis; Anterior knee pain; H/O suicide attempt; Migraine without aura and responsive to treatment; Seasonal allergic rhinitis; HSV-2 seropositive; B12 deficiency; Vitamin D deficiency; Lives in sheltered housing; Supervision of high risk pregnancy, antepartum; Maternal morbid obesity in third trimester, antepartum (HCC); BMI 40.0-44.9, adult (HCC); Marijuana user; Pregnancy with history of cesarean section, antepartum; Elevated blood pressure affecting pregnancy in third trimester, antepartum; Chronic hypertension with superimposed preeclampsia; Abnormal glucose affecting pregnancy; and [redacted] weeks gestation of pregnancy on their problem list.  ----------------------------------------------------------------------------------- Patient reports no complaints.    .  .   . Denies leaking of fluid.  ----------------------------------------------------------------------------------- The following portions of the patient's history were reviewed and updated as appropriate: allergies, current medications, past family history, past medical history, past social history, past surgical history and problem list. Problem list updated.   Objective  Blood pressure 126/72, weight 228 lb (103.4 kg), last menstrual period 05/29/2020. Pregravid weight 220 lb (99.8 kg) Total Weight Gain 8 lb (3.629 kg) Urinalysis:      Fetal Status:           General:  Alert, oriented and cooperative. Patient is in no acute distress.  Skin: Skin is warm and dry. No rash noted.   Cardiovascular: Normal heart rate noted  Respiratory: Normal respiratory effort, no problems with respiration noted  Abdomen: Soft,  gravid, appropriate for gestational age.       Pelvic:  Cervical exam deferred        Extremities: Normal range of motion.     ental Status: Normal mood and affect. Normal behavior. Normal judgment and thought content.   NONSTRESS TEST INTERPRETATION  INDICATIONS: pregnancy-induced hypertension FHR baseline: 150 bmp RESULTS:  A NST procedure was performed with FHR monitoring and a normal baseline established, appropriate time of 20-40 minutes of evaluation, and accels >2 seen w 15x15 characteristics.  Results show a REACTIVE NST.    Assessment   30 y.o. G2P1001 at [redacted]w[redacted]d by  03/05/2021, by Last Menstrual Period presenting for routine prenatal visit  Plan   SECOND Problems (from 09/02/20 to present)     Problem Noted Resolved   Chronic hypertension with superimposed preeclampsia 12/22/2020 by Tresea Mall, CNM No   Pregnancy with history of cesarean section, antepartum 12/12/2020 by Conard Novak, MD No   Marijuana user 09/18/2020 by Mirna Mires, CNM No   Overview Signed 09/18/2020  4:48 PM by Mirna Mires, CNM    + testing ather new OB appointment 09/2020       Maternal morbid obesity in third trimester, antepartum (HCC) 09/08/2020 by Zipporah Plants, CNM No   Overview Signed 09/08/2020 11:42 AM by Zipporah Plants, CNM    BMI >=40 [ ]  early 1h gtt -  [ ]  screen sleep apnea [ ]  anesthesia consult (early and late if BMI > 45) [x]  u/s for dating [x]  nutritional goals [ ]  folic acid 1mg  [ ]  bASA (>12 weeks) [ ]  consider nutrition consult [ ]  consider maternal EKG 1st trimester [ ]  Growth u/s 28 [ ] , 32 [ ] , 36 weeks [ ]  [ ]  NST/AFI weekly 34+ weeks (34[] ,35[] ,36[] , 37[] , 38[] , 39[] , 40[] ) [ ]  IOL by 41 weeks (scheduled, prn [] )  Supervision of high risk pregnancy, antepartum 09/02/2020 by Mirna Mires, CNM No   Overview Addendum 10/19/2020  9:02 PM by Mirna Mires, CNM     Nursing Staff Provider  Office Location  [facility] Dating   LMP = 14w [place]. Dating  sono CW LMP  Language  English Anatomy US  Normal anatomy, female  Flu Vaccine   Genetic Screen  NIPS:   Negx3, XX  SMA negative  TDaP vaccine    Hgb A1C or  GTT  A1C: Third trimester :   Rhogam     LAB RESULTS   Feeding Plan  Blood Type   A+  Contraception  Antibody  negative  Circumcision  NA Rubella  non immune  Pediatrician   RPR   NR  Support Person  HBsAg   negative  Prenatal Classes  HIV  NR    Varicella Non immune  BTL Consent  GBS  (For PCN allergy, check sensitivities)        VBAC Consent  Pap  NILM    Hgb Electro      CF      SMA        Marijuana + at NOB           Gestational age appropriate obstetric precautions including but not limited to vaginal bleeding, contractions, leaking of fluid and fetal movement were reviewed in detail with the patient.    Return in about 1 week (around 01/19/2021) for HROB, NST, PIH labs.  Zipporah Plants, CNM, MSN [facility], [facility] 01/23/2021, 11:27 AM

## 2021-01-24 ENCOUNTER — Other Ambulatory Visit: Payer: Self-pay

## 2021-01-24 ENCOUNTER — Ambulatory Visit: Payer: Medicaid Other | Attending: Obstetrics

## 2021-01-24 DIAGNOSIS — Z5181 Encounter for therapeutic drug level monitoring: Secondary | ICD-10-CM | POA: Diagnosis not present

## 2021-01-24 DIAGNOSIS — O34219 Maternal care for unspecified type scar from previous cesarean delivery: Secondary | ICD-10-CM | POA: Insufficient documentation

## 2021-01-24 DIAGNOSIS — E669 Obesity, unspecified: Secondary | ICD-10-CM | POA: Diagnosis not present

## 2021-01-24 DIAGNOSIS — O10013 Pre-existing essential hypertension complicating pregnancy, third trimester: Secondary | ICD-10-CM | POA: Insufficient documentation

## 2021-01-24 DIAGNOSIS — Z3A34 34 weeks gestation of pregnancy: Secondary | ICD-10-CM | POA: Diagnosis not present

## 2021-01-24 DIAGNOSIS — O99213 Obesity complicating pregnancy, third trimester: Secondary | ICD-10-CM | POA: Insufficient documentation

## 2021-01-25 ENCOUNTER — Encounter: Payer: Medicaid Other | Admitting: Obstetrics and Gynecology

## 2021-01-26 ENCOUNTER — Other Ambulatory Visit: Payer: Self-pay | Admitting: Obstetrics and Gynecology

## 2021-01-26 DIAGNOSIS — O10013 Pre-existing essential hypertension complicating pregnancy, third trimester: Secondary | ICD-10-CM

## 2021-01-26 DIAGNOSIS — O99213 Obesity complicating pregnancy, third trimester: Secondary | ICD-10-CM

## 2021-01-30 ENCOUNTER — Encounter: Payer: Self-pay | Admitting: Obstetrics and Gynecology

## 2021-01-30 ENCOUNTER — Other Ambulatory Visit (HOSPITAL_COMMUNITY)
Admission: RE | Admit: 2021-01-30 | Discharge: 2021-01-30 | Disposition: A | Discharge status: Home / Self Care | Payer: Medicaid Other | Source: Ambulatory Visit | Attending: Obstetrics and Gynecology | Admitting: Obstetrics and Gynecology

## 2021-01-30 ENCOUNTER — Ambulatory Visit (INDEPENDENT_AMBULATORY_CARE_PROVIDER_SITE_OTHER): Payer: Medicaid Other | Admitting: Obstetrics and Gynecology

## 2021-01-30 ENCOUNTER — Encounter: Payer: Medicaid Other | Admitting: Obstetrics

## 2021-01-30 ENCOUNTER — Other Ambulatory Visit: Payer: Self-pay

## 2021-01-30 VITALS — BP 142/82 | Ht 62.0 in | Wt 236.8 lb

## 2021-01-30 DIAGNOSIS — O0990 Supervision of high risk pregnancy, unspecified, unspecified trimester: Secondary | ICD-10-CM

## 2021-01-30 DIAGNOSIS — O0993 Supervision of high risk pregnancy, unspecified, third trimester: Secondary | ICD-10-CM

## 2021-01-30 DIAGNOSIS — Z3A35 35 weeks gestation of pregnancy: Secondary | ICD-10-CM

## 2021-01-30 DIAGNOSIS — O119 Pre-existing hypertension with pre-eclampsia, unspecified trimester: Secondary | ICD-10-CM

## 2021-01-30 DIAGNOSIS — O99213 Obesity complicating pregnancy, third trimester: Secondary | ICD-10-CM

## 2021-01-30 LAB — POCT URINALYSIS DIPSTICK OB: Glucose, UA: NEGATIVE

## 2021-01-30 LAB — OB RESULTS CONSOLE GC/CHLAMYDIA: Gonorrhea: NEGATIVE

## 2021-01-30 NOTE — Patient Instructions (Addendum)
Covid Testing at 02/10/2021 between 8-10:30 am, Drive up testing in front of the [employer]. Follow signs for Pre-admission testing. Please wear a mask.  Induction 02/14/2021 at 8 AM . Enter through the [facility] for an 8 AM induction. Please enter through the ER for a midnight or 5 AM induction.  Please eat a meal prior to your arrival.     30 y.o. G2P1001 at [redacted]w[redacted]d with Estimated Date of Delivery: 03/05/21 was seen today in office to discuss trial of labor after cesarean section (TOLAC) versus elective repeat cesarean delivery (ERCD). The following risks were discussed with the patient.  Risk of uterine rupture at term is 0.78 percent with TOLAC and 0.22 percent with ERCD. 1 in 10 uterine ruptures will result in neonatal death or neurological injury. The benefits of a trial of labor after cesarean (TOLAC) resulting in a vaginal birth after cesarean (VBAC) include the following: shorter length of hospital stay and postpartum recovery (in most cases); fewer complications, such as postpartum fever, wound or uterine infection, thromboembolism (blood clots in the leg or lung), need for blood transfusion and fewer neonatal breathing problems. The risks of an attempted VBAC or TOLAC include the following: Risk of failed trial of labor after cesarean (TOLAC) without a vaginal birth after cesarean (VBAC) resulting in repeat cesarean delivery (RCD) in about 20 to 40 percent of women who attempt VBAC.  Her individualized success rate using the MFMU VBAC risk calculator is 24.6%.  Predicted chance of vaginal birth after cesarean: 24.6% 95% confidence interval: 16.8%, 34.7%     Risk of rupture of uterus resulting in an emergency cesarean delivery. The risk of uterine rupture may be related in part to the type of uterine incision made during the first cesarean delivery. A previous transverse uterine incision has the lowest risk of rupture (0.2 to 1.5 percent risk). Vertical or T-shaped uterine incisions  have a higher risk of uterine rupture (4 to 9 percent risk)The risk of fetal death is very low with both VBAC and elective repeat cesarean delivery (ERCD), but the likelihood of fetal death is higher with VBAC than with ERCD. Maternal death is very rare with either type of delivery. The risks of an elective repeat cesarean delivery (ERCD) were reviewed with the patient including but not limited to: 09/998 risk of uterine rupture which could have serious consequences, bleeding which may require transfusion; infection which may require antibiotics; injury to bowel, bladder or other surrounding organs (bowel, bladder, ureters); injury to the fetus; need for additional procedures including hysterectomy in the event of a life-threatening hemorrhage; thromboembolic phenomenon; abnormal placentation; incisional problems; death and other postoperative or anesthesia complications.    In addition we discussed that our collective office practice is to allow patient's who desire to attempt TOLAC to go into labor naturally.  There is some limited data that rupture rate may increase past [redacted] weeks gestation, but it is reasonable for women who are strongly committed to [facility] to continue pregnancy into the 41st week.  Medical indications necessetating early delivery may arise during the course of any pregnancy.  Given the contraindication on the use of prostaglandins for use in cervical ripening,  recommendation would be to proceed with repeat cesarean for delivery for patient's with unfavorable cervix (low Bishops score) who reach 41 weeks or who otherwise have a medical indication for early delivery.   These risks and benefits are summarized on the consent form, which was reviewed with the patient during the visit.  All her questions answered and she signed a consent indicating a preference for TOLAC/ERCD. A copy of the consent was given to the patient.  Vaginal Birth After Cesarean Delivery  Vaginal birth after cesarean  delivery (VBAC) means giving birth vaginally after previously delivering a baby through a cesarean section, or C-section. VBAC maybe a safe option for you, depending on your health and other factors. Discuss VBAC with your health care provider early in your pregnancy, so you can understand the risks, benefits, and options. Having these discussions earlywill give you time to make your birth plan. What increases the chances for a successful VBAC? These factors increase your chances of a successful VBAC: You have had only one previous C-section. You had a low transverse incision for your C-section. You have had a successful vaginal birth. Your labor starts naturally on or before your due date. You and your baby have had a healthy pregnancy. Your baby is head-down. What happens when I arrive at the birth center or hospital? Once you are in labor and have been admitted into the hospital or birth center, your health care team may: Review your pregnancy history and go over any concerns you have. Talk with you about your birth plan and discuss pain control options. Check your blood pressure, breathing rate, and heart rate. Check your baby's heartbeat. Monitor your uterus for contractions. Check whether your bag of water (amniotic sac) has broken (ruptured). Insert an IV into one of your veins. You may get fluids and medicine through the IV. Monitoring and exams Your health care team may check your contractions (uterine monitoring) and your baby's heart rate (fetal monitoring). You may need to be monitored for long periods at a time (continuously) with a monitoring device. Your health care provider may also do frequent physical exams. These may include checking how and where your baby is positioned in your uterus and checking your cervix to determine whether it is opening up (dilating) or thinning out (effacing). What happens during labor and delivery? Normal labor and delivery are divided into the  following three stages: Stage 1 This is the longest stage of labor. Throughout this stage, you will feel contractions. Contractions are generally mild, infrequent, and irregular at first. They get stronger, more frequent (about every 2-3 minutes), and more regular as you move through this stage. The first stage ends when your cervix is completely dilated to 4 inches (10 cm) and effaced. Stage 2 This stage starts once your cervix is completely dilated and effaced and lasts until you deliver your baby. In this stage, you will feel the urge to push your baby out of your vagina. You may feel stretching and burning pain, especially when the widest part of your baby's head passes through the vaginal opening (crowning). Once your baby is delivered, the umbilical cord will be clamped and cut. Your baby will be placed on your bare chest and covered with a warm blanket. If you are planning to breastfeed, watch your baby for feeding cues, like rooting or sucking. Stage 3 This stage starts immediately after your baby is born and ends after you deliver the placenta. This stage may take anywhere from 5 to 30 minutes. What can I expect after labor and delivery? After labor is over, you and your baby will be checked to make sure you are both healthy, and you will both be monitored until you are ready to go home. You may be encouraged to stay in the same room with your baby to  promote early bonding and successful breastfeeding. Your uterus will be checked and massaged regularly (fundal massage). You may continue to receive fluids and medicines through an IV. You will have some soreness and pain in your abdomen, vagina, and the area of skin between your vaginal opening and your anus (perineum). If an incision was made near your vagina (episiotomy) or if you had some vaginal tearing during delivery, cold compresses may be used to reduce pain and swelling. Follow these instructions at home: Incision care If you  have an episiotomy incision, follow instructions from your health care provider about how to take care of your incision. Check your incision area every day for signs of infection. Check for: Redness, swelling, or pain. More fluid or blood. Warmth. Pus or a bad smell. If directed, put ice on the episiotomy area. To do this: Put ice in a plastic bag. Place a towel between your skin and the bag. Leave the ice on for 20 minutes, 2-3 times a day. Remove the ice if your skin turns bright red. This is very important. If you cannot feel pain, heat, or cold, you have a greater risk of damage to the area. Activity Return to your normal activities as told by your health care provider. Ask your health care provider what activities are safe for you. Avoid sitting for a long time without moving. Get up to take short walks every 1-2 hours. General instructions Take over-the-counter and prescription medicines only as told by your health care provider. Do not take baths, swim, or use a hot tub until your health care provider approves. Ask if you may take showers. You may only be allowed to take sponge baths. It is normal to have vaginal bleeding after delivery. Wear a sanitary pad for vaginal bleeding and discharge. Keep all follow-up visits. This is important. Where to find more information American Pregnancy Association: americanpregnancy.org [employer]ians [employer]necologists: acog.org Summary Vaginal birth after cesarean delivery (VBAC) means giving birth vaginally after previously delivering a baby through a cesarean section, or C-section. Once you are in labor and have been admitted into the hospital or birth center, your health care team may review your pregnancy history and go over any concerns you have. Although most women are able to have a successful VBAC, sometimes it is necessary to have another C-section. Keep all follow-up visits. This is important. This information is not  intended to replace advice given to you by your health care provider. Make sure you discuss any questions you have with your healthcare provider. Document Revised: 07/28/2020 Document Reviewed: 07/28/2020 Elsevier Patient Education  2022 [employer].

## 2021-01-30 NOTE — Progress Notes (Signed)
Routine Prenatal Care Visit  Subjective  Kristina Mccann is a 30 y.o. G2P1001 at [redacted]w[redacted]d being seen today for ongoing prenatal care.  She is currently monitored for the following issues for this high-risk pregnancy and has Acanthosis nigricans; Major depression, chronic (HCC); Gastro-esophageal reflux disease without esophagitis; Anterior knee pain; H/O suicide attempt; Migraine without aura and responsive to treatment; Seasonal allergic rhinitis; HSV-2 seropositive; B12 deficiency; Vitamin D deficiency; Lives in sheltered housing; Supervision of high risk pregnancy, antepartum; Maternal morbid obesity in third trimester, antepartum (HCC); BMI 40.0-44.9, adult (HCC); Marijuana user; Pregnancy with history of cesarean section, antepartum; Elevated blood pressure affecting pregnancy in third trimester, antepartum; Chronic hypertension with superimposed preeclampsia; Abnormal glucose affecting pregnancy; and [redacted] weeks gestation of pregnancy on their problem list.  ----------------------------------------------------------------------------------- Patient reports no complaints. Reports some blurry vision but denies headaches, RUQ pain, and chest pain. Has not yet taken her BP medication. Reports she generally takes it at noon. Reports that BP are well controlled when she checks her values at home. Contractions: Not present. Vag. Bleeding: None.  Movement: Present. Denies leaking of fluid.  ----------------------------------------------------------------------------------- The following portions of the patient's history were reviewed and updated as appropriate: allergies, current medications, past family history, past medical history, past social history, past surgical history and problem list. Problem list updated.   Objective  Blood pressure (!) 142/82, height 5\' 2"  (1.575 m), weight 236 lb 12.8 oz (107.4 kg), last menstrual period 05/29/2020. Pregravid weight 220 lb (99.8 kg) Total Weight Gain 16 lb  12.8 oz (7.62 kg) Urinalysis:      Fetal Status:     Movement: Present     General:  Alert, oriented and cooperative. Patient is in no acute distress.  Skin: Skin is warm and dry. No rash noted.   Cardiovascular: Normal heart rate noted  Respiratory: Normal respiratory effort, no problems with respiration noted  Abdomen: Soft, gravid, appropriate for gestational age. Pain/Pressure: Absent     Pelvic:  Cervical exam deferred        Extremities: Normal range of motion.     Mental Status: Normal mood and affect. Normal behavior. Normal judgment and thought content.     Assessment   30 y.o. G2P1001 at [redacted]w[redacted]d by  03/05/2021, by Last Menstrual Period presenting for routine prenatal visit  Plan   SECOND Problems (from 09/02/20 to present)     Problem Noted Resolved   Chronic hypertension with superimposed preeclampsia 12/22/2020 by 02/21/2021, CNM No   Pregnancy with history of cesarean section, antepartum 12/12/2020 by 02/11/2021, MD No   Marijuana user 09/18/2020 by 11/16/2020, CNM No   Overview Signed 09/18/2020  4:48 PM by 11/16/2020, CNM    + testing ather new OB appointment 09/2020       Maternal morbid obesity in third trimester, antepartum (HCC) 09/08/2020 by 09/10/2020, CNM No   Overview Signed 09/08/2020 11:42 AM by 09/10/2020, CNM    BMI >=40 [ ]  early 1h gtt -  [ ]  screen sleep apnea [ ]  anesthesia consult (early and late if BMI > 45) [x]  u/s for dating [x]  nutritional goals [ ]  folic acid 1mg  [ ]  bASA (>12 weeks) [ ]  consider nutrition consult [ ]  consider maternal EKG 1st trimester [ ]  Growth u/s 28 [ ] , 32 [ ] , 36 weeks [ ]  [ ]  NST/AFI weekly 34+ weeks (34[] ,35[] ,36[] , 37[] , 38[] , 39[] , 40[] ) [ ]  IOL by 41 weeks (scheduled, prn [] )  Supervision of high risk pregnancy, antepartum 09/02/2020 by Mirna Mires, CNM No   Overview Addendum 10/19/2020  9:02 PM by Mirna Mires, CNM     Nursing Staff Provider  Office Location   [facility] Dating   LMP = 14w [place]. Dating sono CW LMP  Language  English Anatomy US  Normal anatomy, female  Flu Vaccine   Genetic Screen  NIPS:   Negx3, XX  SMA negative  TDaP vaccine    Hgb A1C or  GTT  A1C: Third trimester :   Rhogam     LAB RESULTS   Feeding Plan  Blood Type   A+  Contraception  Antibody  negative  Circumcision  NA Rubella  non immune  Pediatrician   RPR   NR  Support Person  HBsAg   negative  Prenatal Classes  HIV  NR    Varicella Non immune  BTL Consent  GBS  (For PCN allergy, check sensitivities)        VBAC Consent  Pap  NILM    Hgb Electro      CF      SMA        Marijuana + at NOB           Preeclampsia labs today  IOL scheduled for 37 weeks Reviewed TOLAC consent with patient. Reports her last cesarean was performed related to FHR decelerations and she did not labor. It was 10 years ago.   Predicted chance of vaginal birth after cesarean: 24.6% 95% confidence interval: 16.8%, 34.7%  NST: 135 bpm baseline, moderate variability, 15x15 accelerations, no decelerations. Reactive  GBS and GC/CT today.   Gestational age appropriate obstetric precautions including but not limited to vaginal bleeding, contractions, leaking of fluid and fetal movement were reviewed in detail with the patient.     30 y.o. G2P1001 at [redacted]w[redacted]d with Estimated Date of Delivery: 03/05/21 was seen today in office to discuss trial of labor after cesarean section (TOLAC) versus elective repeat cesarean delivery (ERCD). The following risks were discussed with the patient.  Risk of uterine rupture at term is 0.78 percent with TOLAC and 0.22 percent with ERCD. 1 in 10 uterine ruptures will result in neonatal death or neurological injury. The benefits of a trial of labor after cesarean (TOLAC) resulting in a vaginal birth after cesarean (VBAC) include the following: shorter length of hospital stay and postpartum recovery (in most cases); fewer complications, such as postpartum fever,  wound or uterine infection, thromboembolism (blood clots in the leg or lung), need for blood transfusion and fewer neonatal breathing problems. The risks of an attempted VBAC or TOLAC include the following: Risk of failed trial of labor after cesarean (TOLAC) without a vaginal birth after cesarean (VBAC) resulting in repeat cesarean delivery (RCD) in about 20 to 40 percent of women who attempt VBAC.  Her individualized success rate using the MFMU VBAC risk calculator is 24.6%.   Risk of rupture of uterus resulting in an emergency cesarean delivery. The risk of uterine rupture may be related in part to the type of uterine incision made during the first cesarean delivery. A previous transverse uterine incision has the lowest risk of rupture (0.2 to 1.5 percent risk). Vertical or T-shaped uterine incisions have a higher risk of uterine rupture (4 to 9 percent risk)The risk of fetal death is very low with both VBAC and elective repeat cesarean delivery (ERCD), but the likelihood of fetal death is higher with VBAC than with ERCD. Maternal death is  very rare with either type of delivery. The risks of an elective repeat cesarean delivery (ERCD) were reviewed with the patient including but not limited to: 09/998 risk of uterine rupture which could have serious consequences, bleeding which may require transfusion; infection which may require antibiotics; injury to bowel, bladder or other surrounding organs (bowel, bladder, ureters); injury to the fetus; need for additional procedures including hysterectomy in the event of a life-threatening hemorrhage; thromboembolic phenomenon; abnormal placentation; incisional problems; death and other postoperative or anesthesia complications.    In addition we discussed that our collective office practice is to allow patient's who desire to attempt TOLAC to go into labor naturally.  There is some limited data that rupture rate may increase past [redacted] weeks gestation, but it is  reasonable for women who are strongly committed to [facility] to continue pregnancy into the 41st week.  Medical indications necessetating early delivery may arise during the course of any pregnancy.  Given the contraindication on the use of prostaglandins for use in cervical ripening,  recommendation would be to proceed with repeat cesarean for delivery for patient's with unfavorable cervix (low Bishops score) who reach 41 weeks or who otherwise have a medical indication for early delivery.   These risks and benefits are summarized on the consent form, which was reviewed with the patient during the visit.  All her questions answered and she signed a consent indicating a preference for TOLAC/ERCD. A copy of the consent was given to the patient.    Return in about 1 week (around 02/06/2021) for ROB with MD/ NST.  Natale Milch MD [facility], [facility] 01/30/2021, 10:44 AM

## 2021-01-31 ENCOUNTER — Other Ambulatory Visit: Payer: Self-pay | Admitting: Obstetrics and Gynecology

## 2021-01-31 ENCOUNTER — Ambulatory Visit: Payer: Medicaid Other

## 2021-01-31 ENCOUNTER — Ambulatory Visit (HOSPITAL_COMMUNITY): Payer: Medicaid Other | Attending: Obstetrics and Gynecology

## 2021-01-31 ENCOUNTER — Ambulatory Visit: Payer: Medicaid Other | Attending: Maternal & Fetal Medicine

## 2021-01-31 ENCOUNTER — Other Ambulatory Visit: Payer: Self-pay

## 2021-01-31 VITALS — BP 97/67 | HR 96 | Temp 98.2°F | Resp 18 | Ht 62.0 in | Wt 236.0 lb

## 2021-01-31 DIAGNOSIS — O119 Pre-existing hypertension with pre-eclampsia, unspecified trimester: Secondary | ICD-10-CM

## 2021-01-31 DIAGNOSIS — O0993 Supervision of high risk pregnancy, unspecified, third trimester: Secondary | ICD-10-CM

## 2021-01-31 DIAGNOSIS — O10013 Pre-existing essential hypertension complicating pregnancy, third trimester: Secondary | ICD-10-CM

## 2021-01-31 DIAGNOSIS — F1290 Cannabis use, unspecified, uncomplicated: Secondary | ICD-10-CM

## 2021-01-31 DIAGNOSIS — O0990 Supervision of high risk pregnancy, unspecified, unspecified trimester: Secondary | ICD-10-CM

## 2021-01-31 DIAGNOSIS — O99213 Obesity complicating pregnancy, third trimester: Secondary | ICD-10-CM

## 2021-01-31 DIAGNOSIS — O34219 Maternal care for unspecified type scar from previous cesarean delivery: Secondary | ICD-10-CM

## 2021-01-31 DIAGNOSIS — Z3A35 35 weeks gestation of pregnancy: Secondary | ICD-10-CM | POA: Insufficient documentation

## 2021-01-31 DIAGNOSIS — Z3A34 34 weeks gestation of pregnancy: Secondary | ICD-10-CM | POA: Insufficient documentation

## 2021-01-31 DIAGNOSIS — E669 Obesity, unspecified: Secondary | ICD-10-CM | POA: Diagnosis not present

## 2021-01-31 LAB — CBC WITH DIFFERENTIAL
Basophils Absolute: 0 10*3/uL (ref 0.0–0.2)
Basos: 0 %
EOS (ABSOLUTE): 0.1 10*3/uL (ref 0.0–0.4)
Eos: 2 %
Hematocrit: 30.4 % — ABNORMAL LOW (ref 34.0–46.6)
Hemoglobin: 10.1 g/dL — ABNORMAL LOW (ref 11.1–15.9)
Immature Grans (Abs): 0.1 10*3/uL (ref 0.0–0.1)
Immature Granulocytes: 1 %
Lymphocytes Absolute: 1.6 10*3/uL (ref 0.7–3.1)
Lymphs: 21 %
MCH: 28.1 pg (ref 26.6–33.0)
MCHC: 33.2 g/dL (ref 31.5–35.7)
MCV: 84 fL (ref 79–97)
Monocytes Absolute: 0.6 10*3/uL (ref 0.1–0.9)
Monocytes: 7 %
Neutrophils Absolute: 5.2 10*3/uL (ref 1.4–7.0)
Neutrophils: 69 %
RBC: 3.6 x10E6/uL — ABNORMAL LOW (ref 3.77–5.28)
RDW: 14.1 % (ref 11.7–15.4)
WBC: 7.5 10*3/uL (ref 3.4–10.8)

## 2021-01-31 LAB — COMPREHENSIVE METABOLIC PANEL
ALT: 7 IU/L (ref 0–32)
AST: 10 IU/L (ref 0–40)
Albumin/Globulin Ratio: 0.9 — ABNORMAL LOW (ref 1.2–2.2)
Albumin: 3 g/dL — ABNORMAL LOW (ref 3.9–5.0)
Alkaline Phosphatase: 103 IU/L (ref 44–121)
BUN/Creatinine Ratio: 11 (ref 9–23)
BUN: 8 mg/dL (ref 6–20)
Bilirubin Total: <0.2 mg/dL (ref 0.0–1.2)
CO2: 20 mmol/L (ref 20–29)
Calcium: 8.9 mg/dL (ref 8.7–10.2)
Chloride: 102 mmol/L (ref 96–106)
Creatinine, Ser: 0.74 mg/dL (ref 0.57–1.00)
Globulin, Total: 3.4 g/dL (ref 1.5–4.5)
Glucose: 110 mg/dL — ABNORMAL HIGH (ref 65–99)
Potassium: 4.2 mmol/L (ref 3.5–5.2)
Sodium: 136 mmol/L (ref 134–144)
Total Protein: 6.4 g/dL (ref 6.0–8.5)
eGFR: 112 mL/min/{1.73_m2} (ref >59)

## 2021-02-01 ENCOUNTER — Other Ambulatory Visit: Payer: Self-pay | Admitting: Obstetrics and Gynecology

## 2021-02-01 DIAGNOSIS — O99213 Obesity complicating pregnancy, third trimester: Secondary | ICD-10-CM

## 2021-02-01 DIAGNOSIS — O10919 Unspecified pre-existing hypertension complicating pregnancy, unspecified trimester: Secondary | ICD-10-CM

## 2021-02-01 LAB — CERVICOVAGINAL ANCILLARY ONLY
Chlamydia: NEGATIVE
Comment: NEGATIVE
Comment: NORMAL
Neisseria Gonorrhea: NEGATIVE

## 2021-02-03 ENCOUNTER — Other Ambulatory Visit: Payer: Self-pay

## 2021-02-03 ENCOUNTER — Ambulatory Visit (INDEPENDENT_AMBULATORY_CARE_PROVIDER_SITE_OTHER): Payer: Medicaid Other | Admitting: Obstetrics and Gynecology

## 2021-02-03 ENCOUNTER — Encounter: Payer: Self-pay | Admitting: Obstetrics and Gynecology

## 2021-02-03 VITALS — BP 120/72 | Ht 62.0 in | Wt 235.6 lb

## 2021-02-03 DIAGNOSIS — O09893 Supervision of other high risk pregnancies, third trimester: Secondary | ICD-10-CM

## 2021-02-03 DIAGNOSIS — Z3A35 35 weeks gestation of pregnancy: Secondary | ICD-10-CM

## 2021-02-03 LAB — CULTURE, BETA STREP (GROUP B ONLY): Strep Gp B Culture: NEGATIVE

## 2021-02-03 NOTE — Progress Notes (Signed)
Routine Prenatal Care Visit  Subjective  Kristina Mccann is a 30 y.o. G2P1001 at [redacted]w[redacted]d being seen today for ongoing prenatal care.  She is currently monitored for the following issues for this high-risk pregnancy and has Acanthosis nigricans; Major depression, chronic (HCC); Gastro-esophageal reflux disease without esophagitis; Anterior knee pain; H/O suicide attempt; Migraine without aura and responsive to treatment; Seasonal allergic rhinitis; HSV-2 seropositive; B12 deficiency; Vitamin D deficiency; Lives in sheltered housing; Supervision of high risk pregnancy, antepartum; Maternal morbid obesity in third trimester, antepartum (HCC); BMI 40.0-44.9, adult (HCC); Marijuana user; Pregnancy with history of cesarean section, antepartum; Elevated blood pressure affecting pregnancy in third trimester, antepartum; Chronic hypertension with superimposed preeclampsia; Abnormal glucose affecting pregnancy; and [redacted] weeks gestation of pregnancy on their problem list.  ----------------------------------------------------------------------------------- Patient reports no complaints.  She denies headaches, vision changes and right upper quadrant pain. Contractions: Irregular. Vag. Bleeding: None.  Movement: Absent. Denies leaking of fluid.  ----------------------------------------------------------------------------------- The following portions of the patient's history were reviewed and updated as appropriate: allergies, current medications, past family history, past medical history, past social history, past surgical history and problem list. Problem list updated.   Objective  Blood pressure 120/72, height 5\' 2"  (1.575 m), weight 235 lb 9.6 oz (106.9 kg), last menstrual period 05/29/2020. Pregravid weight 220 lb (99.8 kg) Total Weight Gain 15 lb 9.6 oz (7.076 kg) Urinalysis:      Fetal Status:     Movement: Absent     General:  Alert, oriented and cooperative. Patient is in no acute distress.  Skin:  Skin is warm and dry. No rash noted.   Cardiovascular: Normal heart rate noted  Respiratory: Normal respiratory effort, no problems with respiration noted  Abdomen: Soft, gravid, appropriate for gestational age. Pain/Pressure: Absent     Pelvic:  Cervical exam deferred        Extremities: Normal range of motion.  Edema: None  Mental Status: Normal mood and affect. Normal behavior. Normal judgment and thought content.     Assessment   30 y.o. G2P1001 at [redacted]w[redacted]d by  03/05/2021, by Last Menstrual Period presenting for routine prenatal visit  Plan   SECOND Problems (from 09/02/20 to present)     Problem Noted Resolved   Chronic hypertension with superimposed preeclampsia 12/22/2020 by 02/21/2021, CNM No   Pregnancy with history of cesarean section, antepartum 12/12/2020 by 02/11/2021, MD No   Marijuana user 09/18/2020 by 11/16/2020, CNM No   Overview Signed 09/18/2020  4:48 PM by 11/16/2020, CNM    + testing ather new OB appointment 09/2020       Maternal morbid obesity in third trimester, antepartum (HCC) 09/08/2020 by 09/10/2020, CNM No   Overview Signed 09/08/2020 11:42 AM by 09/10/2020, CNM    BMI >=40 [ ]  early 1h gtt -  [ ]  screen sleep apnea [ ]  anesthesia consult (early and late if BMI > 45) [x]  u/s for dating [x]  nutritional goals [ ]  folic acid 1mg  [ ]  bASA (>12 weeks) [ ]  consider nutrition consult [ ]  consider maternal EKG 1st trimester [ ]  Growth u/s 28 [ ] , 32 [ ] , 36 weeks [ ]  [ ]  NST/AFI weekly 34+ weeks (34[] ,35[] ,36[] , 37[] , 38[] , 39[] , 40[] ) [ ]  IOL by 41 weeks (scheduled, prn [] )        Supervision of high risk pregnancy, antepartum 09/02/2020 by , CNM No   Overview Addendum 01/30/2021  1:29 PM by  R, MD     Nursing Staff Provider  Office Location  [facility] Dating   LMP = 14w [place]. Dating sono CW LMP  Language  English Anatomy US  Normal anatomy, female  Flu Vaccine   Genetic Screen  NIPS:   Negx3,  XX  SMA negative  TDaP vaccine    Hgb A1C or  GTT  A1C: Third trimester :   Rhogam  Not needed   LAB RESULTS   Feeding Plan  Blood Type   A+  Contraception  Antibody  negative  Circumcision  NA Rubella  non immune  Pediatrician   RPR   NR  Support Person  HBsAg   negative  Prenatal Classes  HIV  NR    Varicella Non immune  BTL Consent  GBS  (For PCN allergy, check sensitivities)        VBAC Consent 01/30/2021 Pap  NILM    Hgb Electro      CF      SMA        Marijuana + at [facility] CHTN with Superimposed Preeclampsia           NST: 140 bpm baseline, moderate variability, 15z15 accelerations, no decelerations.  IOL scheduled for 37 weeks, continue weekly labs and NSTs  Gestational age appropriate obstetric precautions including but not limited to vaginal bleeding, contractions, leaking of fluid and fetal movement were reviewed in detail with the patient.    Return for ROB as scheduled.  Natale Milch MD [facility], [facility] 02/03/2021, 2:44 PM

## 2021-02-03 NOTE — Patient Instructions (Signed)
Labor Induction ?Labor induction is when steps are taken to cause a pregnant woman to begin the labor process. Most women go into labor on their own between 37 weeks and 42 weeks of pregnancy. When this does not happen, or when there is a medical need for labor to begin, steps may be taken to induce, or bring on, labor. ?Labor induction causes a pregnant woman's uterus to contract. It also causes the cervix to soften (ripen), open (dilate), and thin out. Usually, labor is not induced before 39 weeks of pregnancy unless there is a medical reason to do so. ?When is labor induction considered? ?Labor induction may be right for you if: ?Your pregnancy lasts longer than 41 to 42 weeks. ?Your placenta is separating from your uterus (placental abruption). ?You have a rupture of membranes and your labor does not begin. ?You have health problems, like diabetes or high blood pressure (preeclampsia) during your pregnancy. ?Your baby has stopped growing or does not have enough amniotic fluid. ?Before labor induction begins, your health care provider will consider the following factors: ?Your medical condition and the baby's condition. ?How many weeks you have been pregnant. ?How mature the baby's lungs are. ?The condition of your cervix. ?The position of the baby. ?The size of your birth canal. ?Tell a health care provider about: ?Any allergies you have. ?All medicines you are taking, including vitamins, herbs, eye drops, creams, and over-the-counter medicines. ?Any problems you or your family members have had with anesthetic medicines. ?Any surgeries you have had. ?Any blood disorders you have. ?Any medical conditions you have. ?What are the risks? ?Generally, this is a safe procedure. However, problems may occur, including: ?Failed induction. ?Changes in fetal heart rate, such as being too high, too low, or irregular (erratic). ?Infection in the mother or the baby. ?Increased risk of having a cesarean delivery. ?Breaking off  (abruption) of the placenta from the uterus. This is rare. ?Rupture of the uterus. This is very rare. ?Your baby could fail to get enough blood flow or oxygen. This can be life-threatening. ?When induction is needed for medical reasons, the benefits generally outweigh the risks. ?What happens during the procedure? ?During the procedure, your health care provider will use one of these methods to induce labor: ?Stripping the membranes. In this method, the amniotic sac tissue is gently separated from the cervix. This causes the following to happen: ?Your cervix stretches, which in turn causes the release of prostaglandins. ?Prostaglandins induce labor and cause the uterus to contract. ?This procedure is often done in an office visit. You will be sent home to wait for contractions to begin. ?Prostaglandin medicine. This medicine starts contractions and causes the cervix to dilate and ripen. This can be taken by mouth (orally) or by being inserted into the vagina (suppository). ?Inserting a small, thin tube (catheter) with a balloon into the vagina and then expanding the balloon with water to dilate the cervix. ?Breaking the water. In this method, a small instrument is used to make a small hole in the amniotic sac. This eventually causes the amniotic sac to break. Contractions should begin within a few hours. ?Medicine to trigger or strengthen contractions. This medicine is given through an IV that is inserted into a vein in your arm. ?This procedure may vary among health care providers and hospitals. ?Where to find more information ?March of Dimes: www.marchofdimes.org ?The American College of Obstetricians and Gynecologists: www.acog.org ?Summary ?Labor induction causes a pregnant woman's uterus to contract. It also causes the cervix   to soften (ripen), open (dilate), and thin out. ?Labor is usually not induced before 39 weeks of pregnancy unless there is a medical reason to do so. ?When induction is needed for medical  reasons, the benefits generally outweigh the risks. ?Talk with your health care provider about which methods of labor induction are right for you. ?This information is not intended to replace advice given to you by your health care provider. Make sure you discuss any questions you have with your health care provider. ?Document Revised: 05/12/2020 Document Reviewed: 05/12/2020 ?Elsevier Patient Education ? 2022 Elsevier Inc. ? ?

## 2021-02-07 ENCOUNTER — Other Ambulatory Visit: Payer: Self-pay

## 2021-02-07 ENCOUNTER — Ambulatory Visit: Payer: Medicaid Other | Attending: Obstetrics and Gynecology

## 2021-02-07 DIAGNOSIS — O10919 Unspecified pre-existing hypertension complicating pregnancy, unspecified trimester: Secondary | ICD-10-CM

## 2021-02-07 DIAGNOSIS — O10913 Unspecified pre-existing hypertension complicating pregnancy, third trimester: Secondary | ICD-10-CM | POA: Insufficient documentation

## 2021-02-07 DIAGNOSIS — Z3A36 36 weeks gestation of pregnancy: Secondary | ICD-10-CM | POA: Diagnosis not present

## 2021-02-07 DIAGNOSIS — O113 Pre-existing hypertension with pre-eclampsia, third trimester: Secondary | ICD-10-CM

## 2021-02-07 DIAGNOSIS — O99213 Obesity complicating pregnancy, third trimester: Secondary | ICD-10-CM | POA: Diagnosis not present

## 2021-02-09 ENCOUNTER — Ambulatory Visit (INDEPENDENT_AMBULATORY_CARE_PROVIDER_SITE_OTHER): Payer: Medicaid Other | Admitting: Obstetrics

## 2021-02-09 ENCOUNTER — Other Ambulatory Visit: Payer: Self-pay

## 2021-02-09 VITALS — BP 132/76 | Ht 62.0 in | Wt 236.4 lb

## 2021-02-09 DIAGNOSIS — Z3A36 36 weeks gestation of pregnancy: Secondary | ICD-10-CM

## 2021-02-09 DIAGNOSIS — O0990 Supervision of high risk pregnancy, unspecified, unspecified trimester: Secondary | ICD-10-CM

## 2021-02-09 LAB — POCT URINALYSIS DIPSTICK OB
Glucose, UA: NEGATIVE
POC,PROTEIN,UA: NEGATIVE

## 2021-02-09 NOTE — Progress Notes (Signed)
Routine Prenatal Care Visit  Subjective  Kristina Mccann is a 30 y.o. G2P1001 at 106w4d being seen today for ongoing prenatal care.  She is currently monitored for the following issues for this high-risk pregnancy and has Acanthosis nigricans; Major depression, chronic (HCC); Gastro-esophageal reflux disease without esophagitis; Anterior knee pain; H/O suicide attempt; Migraine without aura and responsive to treatment; Seasonal allergic rhinitis; HSV-2 seropositive; B12 deficiency; Vitamin D deficiency; Lives in sheltered housing; Supervision of high risk pregnancy, antepartum; Maternal morbid obesity in third trimester, antepartum (HCC); BMI 40.0-44.9, adult (HCC); Marijuana user; Pregnancy with history of cesarean section, antepartum; Elevated blood pressure affecting pregnancy in third trimester, antepartum; Chronic hypertension with superimposed preeclampsia; Abnormal glucose affecting pregnancy; and [redacted] weeks gestation of pregnancy on their problem list.  ----------------------------------------------------------------------------------- Patient reports no complaints.  Having an NST Contractions: Irritability. Vag. Bleeding: None.  Movement: Present. Leaking Fluid denies.  ----------------------------------------------------------------------------------- The following portions of the patient's history were reviewed and updated as appropriate: allergies, current medications, past family history, past medical history, past social history, past surgical history and problem list. Problem list updated.  Objective  Blood pressure 132/76, height 5\' 2"  (1.575 m), weight 236 lb 6.4 oz (107.2 kg), last menstrual period 05/29/2020. Pregravid weight 220 lb (99.8 kg) Total Weight Gain 16 lb 6.4 oz (7.439 kg) Urinalysis: Urine Protein    Urine Glucose    Fetal Status:     Movement: Present     General:  Alert, oriented and cooperative. Patient is in no acute distress.  Skin: Skin is warm and dry. No rash  noted.   Cardiovascular: Normal heart rate noted  Respiratory: Normal respiratory effort, no problems with respiration noted  Abdomen: Soft, gravid, appropriate for gestational age. Pain/Pressure: Present     Pelvic:  Cervical exam deferred        Extremities: Normal range of motion.  Edema: None  Mental Status: Normal mood and affect. Normal behavior. Normal judgment and thought content.   Assessment   30 y.o. G2P1001 at [redacted]w[redacted]d by  03/05/2021, by Last Menstrual Period presenting for routine prenatal visit  Plan   SECOND Problems (from 09/02/20 to present)    Problem Noted Resolved   Chronic hypertension with superimposed preeclampsia 12/22/2020 by 02/21/2021, CNM No   Pregnancy with history of cesarean section, antepartum 12/12/2020 by 02/11/2021, MD No   Marijuana user 09/18/2020 by 11/16/2020, CNM No   Overview Signed 09/18/2020  4:48 PM by 11/16/2020, CNM    + testing ather new OB appointment 09/2020       Maternal morbid obesity in third trimester, antepartum (HCC) 09/08/2020 by 09/10/2020, CNM No   Overview Signed 09/08/2020 11:42 AM by 09/10/2020, CNM    BMI >=40 [ ]  early 1h gtt -  [ ]  screen sleep apnea [ ]  anesthesia consult (early and late if BMI > 45) [x]  u/s for dating [x]  nutritional goals [ ]  folic acid 1mg  [ ]  bASA (>12 weeks) [ ]  consider nutrition consult [ ]  consider maternal EKG 1st trimester [ ]  Growth u/s 28 [ ] , 32 [ ] , 36 weeks [ ]  [ ]  NST/AFI weekly 34+ weeks (34[] ,35[] ,36[] , 37[] , 38[] , 39[] , 40[] ) [ ]  IOL by 41 weeks (scheduled, prn [] )        Supervision of high risk pregnancy, antepartum 09/02/2020 by , CNM No   Overview Addendum 01/30/2021  1:29 PM by , MD     Nursing Staff Provider  Office Location  [facility] Dating   LMP = 14w [place]. Dating sono CW LMP  Language  English Anatomy US  Normal anatomy, female  Flu Vaccine   Genetic Screen  NIPS:   Negx3, XX  SMA negative  TDaP vaccine     Hgb A1C or  GTT  A1C: Third trimester :   Rhogam  Not needed   LAB RESULTS   Feeding Plan  Blood Type   A+  Contraception  Antibody  negative  Circumcision  NA Rubella  non immune  Pediatrician   RPR   NR  Support Person  HBsAg   negative  Prenatal Classes  HIV  NR    Varicella Non immune  BTL Consent  GBS  (For PCN allergy, check sensitivities)        VBAC Consent 01/30/2021 Pap  NILM    Hgb Electro      CF      SMA         Marijuana + at NOB CHTN with Superimposed Preeclampsia           Preterm labor symptoms and general obstetric precautions including but not limited to vaginal bleeding, contractions, leaking of fluid and fetal movement were reviewed in detail with the patient. Please refer to After Visit Summary for other counseling recommendations.  She has an IOL set up for next week. Her GBS is negative. Aware that an IOL may be a chaleenge due to her previous CS hx and earlier date for IOL. NST done today.  Return in about 1 week (around 02/16/2021) for return OB, NSTs weekly.  Mirna Mires, CNM  02/09/2021 2:08 PM

## 2021-02-09 NOTE — Addendum Note (Signed)
Addended by: Clement Husbands A on: 02/09/2021 04:07 PM   Modules accepted: Orders

## 2021-02-10 ENCOUNTER — Other Ambulatory Visit
Admission: RE | Admit: 2021-02-10 | Discharge: 2021-02-10 | Disposition: A | Discharge status: Home / Self Care | Payer: Medicaid Other | Source: Ambulatory Visit | Attending: Obstetrics and Gynecology | Admitting: Obstetrics and Gynecology

## 2021-02-10 DIAGNOSIS — Z20822 Contact with and (suspected) exposure to covid-19: Secondary | ICD-10-CM | POA: Insufficient documentation

## 2021-02-10 DIAGNOSIS — Z419 Encounter for procedure for purposes other than remedying health state, unspecified: Secondary | ICD-10-CM | POA: Diagnosis not present

## 2021-02-10 DIAGNOSIS — Z01812 Encounter for preprocedural laboratory examination: Secondary | ICD-10-CM | POA: Insufficient documentation

## 2021-02-10 LAB — SARS CORONAVIRUS 2 (TAT 6-24 HRS): SARS Coronavirus 2: NEGATIVE

## 2021-02-14 ENCOUNTER — Other Ambulatory Visit: Payer: Medicaid Other

## 2021-02-14 ENCOUNTER — Other Ambulatory Visit: Payer: Self-pay

## 2021-02-14 ENCOUNTER — Inpatient Hospital Stay
Admission: RE | Admit: 2021-02-14 | Discharge: 2021-02-18 | DRG: 787 | Disposition: A | Discharge status: Home / Self Care | Payer: Medicaid Other | Attending: Obstetrics & Gynecology | Admitting: Obstetrics & Gynecology

## 2021-02-14 ENCOUNTER — Encounter: Payer: Self-pay | Admitting: Obstetrics and Gynecology

## 2021-02-14 ENCOUNTER — Other Ambulatory Visit: Payer: Self-pay | Admitting: Obstetrics & Gynecology

## 2021-02-14 DIAGNOSIS — Z3A37 37 weeks gestation of pregnancy: Secondary | ICD-10-CM

## 2021-02-14 DIAGNOSIS — A6000 Herpesviral infection of urogenital system, unspecified: Secondary | ICD-10-CM | POA: Diagnosis present

## 2021-02-14 DIAGNOSIS — O34211 Maternal care for low transverse scar from previous cesarean delivery: Secondary | ICD-10-CM | POA: Diagnosis not present

## 2021-02-14 DIAGNOSIS — O114 Pre-existing hypertension with pre-eclampsia, complicating childbirth: Principal | ICD-10-CM | POA: Diagnosis present

## 2021-02-14 DIAGNOSIS — O1002 Pre-existing essential hypertension complicating childbirth: Secondary | ICD-10-CM | POA: Diagnosis not present

## 2021-02-14 DIAGNOSIS — O0990 Supervision of high risk pregnancy, unspecified, unspecified trimester: Secondary | ICD-10-CM

## 2021-02-14 DIAGNOSIS — O99324 Drug use complicating childbirth: Secondary | ICD-10-CM | POA: Diagnosis present

## 2021-02-14 DIAGNOSIS — F1290 Cannabis use, unspecified, uncomplicated: Secondary | ICD-10-CM | POA: Diagnosis not present

## 2021-02-14 DIAGNOSIS — O1092 Unspecified pre-existing hypertension complicating childbirth: Secondary | ICD-10-CM | POA: Diagnosis not present

## 2021-02-14 DIAGNOSIS — O119 Pre-existing hypertension with pre-eclampsia, unspecified trimester: Secondary | ICD-10-CM | POA: Diagnosis present

## 2021-02-14 DIAGNOSIS — O99214 Obesity complicating childbirth: Secondary | ICD-10-CM | POA: Diagnosis present

## 2021-02-14 DIAGNOSIS — O9832 Other infections with a predominantly sexual mode of transmission complicating childbirth: Secondary | ICD-10-CM | POA: Diagnosis present

## 2021-02-14 DIAGNOSIS — R1084 Generalized abdominal pain: Secondary | ICD-10-CM | POA: Diagnosis not present

## 2021-02-14 DIAGNOSIS — Z7982 Long term (current) use of aspirin: Secondary | ICD-10-CM

## 2021-02-14 DIAGNOSIS — G8918 Other acute postprocedural pain: Secondary | ICD-10-CM | POA: Diagnosis not present

## 2021-02-14 DIAGNOSIS — O9081 Anemia of the puerperium: Secondary | ICD-10-CM | POA: Diagnosis not present

## 2021-02-14 DIAGNOSIS — D62 Acute posthemorrhagic anemia: Secondary | ICD-10-CM | POA: Diagnosis not present

## 2021-02-14 DIAGNOSIS — O99213 Obesity complicating pregnancy, third trimester: Secondary | ICD-10-CM | POA: Diagnosis present

## 2021-02-14 DIAGNOSIS — O10919 Unspecified pre-existing hypertension complicating pregnancy, unspecified trimester: Secondary | ICD-10-CM | POA: Diagnosis present

## 2021-02-14 DIAGNOSIS — O34219 Maternal care for unspecified type scar from previous cesarean delivery: Secondary | ICD-10-CM

## 2021-02-14 LAB — URINE DRUG SCREEN, QUALITATIVE (ARMC ONLY)
Amphetamines, Ur Screen: NOT DETECTED
Barbiturates, Ur Screen: NOT DETECTED
Benzodiazepine, Ur Scrn: NOT DETECTED
Cannabinoid 50 Ng, Ur ~~LOC~~: POSITIVE — AB
Cocaine Metabolite,Ur ~~LOC~~: NOT DETECTED
MDMA (Ecstasy)Ur Screen: NOT DETECTED
Methadone Scn, Ur: NOT DETECTED
Opiate, Ur Screen: NOT DETECTED
Phencyclidine (PCP) Ur S: NOT DETECTED
Tricyclic, Ur Screen: NOT DETECTED

## 2021-02-14 LAB — CBC
HCT: 30.3 % — ABNORMAL LOW (ref 36.0–46.0)
Hemoglobin: 10 g/dL — ABNORMAL LOW (ref 12.0–15.0)
MCH: 28.5 pg (ref 26.0–34.0)
MCHC: 33 g/dL (ref 30.0–36.0)
MCV: 86.3 fL (ref 80.0–100.0)
Platelets: 277 10*3/uL (ref 150–400)
RBC: 3.51 MIL/uL — ABNORMAL LOW (ref 3.87–5.11)
RDW: 14.8 % (ref 11.5–15.5)
WBC: 7.2 10*3/uL (ref 4.0–10.5)
nRBC: 0 % (ref 0.0–0.2)

## 2021-02-14 LAB — COMPREHENSIVE METABOLIC PANEL
ALT: 5 U/L (ref 0–44)
AST: 21 U/L (ref 15–41)
Albumin: 2.7 g/dL — ABNORMAL LOW (ref 3.5–5.0)
Alkaline Phosphatase: 94 U/L (ref 38–126)
Anion gap: 9 (ref 5–15)
BUN: 9 mg/dL (ref 6–20)
CO2: 22 mmol/L (ref 22–32)
Calcium: 8.8 mg/dL — ABNORMAL LOW (ref 8.9–10.3)
Chloride: 103 mmol/L (ref 98–111)
Creatinine, Ser: 0.78 mg/dL (ref 0.44–1.00)
GFR, Estimated: >60 mL/min (ref >60)
Glucose, Bld: 130 mg/dL — ABNORMAL HIGH (ref 70–99)
Potassium: 3.6 mmol/L (ref 3.5–5.1)
Sodium: 134 mmol/L — ABNORMAL LOW (ref 135–145)
Total Bilirubin: 0.4 mg/dL (ref 0.3–1.2)
Total Protein: 6.9 g/dL (ref 6.5–8.1)

## 2021-02-14 LAB — TYPE AND SCREEN
ABO/RH(D): A POS
Antibody Screen: NEGATIVE

## 2021-02-14 LAB — PROTEIN / CREATININE RATIO, URINE
Creatinine, Urine: 217 mg/dL
Protein Creatinine Ratio: 0.22 mg/mg{Cre} — ABNORMAL HIGH (ref 0.00–0.15)
Total Protein, Urine: 47 mg/dL

## 2021-02-14 MED ORDER — NIFEDIPINE ER OSMOTIC RELEASE 30 MG PO TB24
60.0000 mg | ORAL_TABLET | Freq: Every day | ORAL | Status: DC
  Administered 2021-02-14: 30 mg via ORAL
  Administered 2021-02-15: 60 mg via ORAL
  Filled 2021-02-14 (×2): qty 2

## 2021-02-14 MED ORDER — BUTORPHANOL TARTRATE 1 MG/ML IJ SOLN
2.0000 mg | INTRAMUSCULAR | Status: DC | PRN
Start: 2021-02-14 — End: 2021-02-15

## 2021-02-14 MED ORDER — LABETALOL HCL 5 MG/ML IV SOLN: 40.0000 mg | INTRAVENOUS | Status: DC | PRN

## 2021-02-14 MED ORDER — MISOPROSTOL 200 MCG PO TABS
ORAL_TABLET | ORAL | Status: AC
  Filled 2021-02-14: qty 4

## 2021-02-14 MED ORDER — LIDOCAINE HCL (PF) 1 % IJ SOLN
30.0000 mL | INTRAMUSCULAR | Status: DC | PRN
Start: 2021-02-14 — End: 2021-02-15

## 2021-02-14 MED ORDER — OXYTOCIN 10 UNIT/ML IJ SOLN
INTRAMUSCULAR | Status: AC
  Filled 2021-02-14: qty 2

## 2021-02-14 MED ORDER — OXYTOCIN-SODIUM CHLORIDE 30-0.9 UT/500ML-% IV SOLN
1.0000 m[IU]/min | INTRAVENOUS | Status: DC
  Administered 2021-02-14: 2 m[IU]/min via INTRAVENOUS
  Administered 2021-02-15: 30 [IU] via INTRAVENOUS
  Filled 2021-02-14 (×2): qty 500

## 2021-02-14 MED ORDER — HYDRALAZINE HCL 20 MG/ML IJ SOLN
10.0000 mg | INTRAMUSCULAR | Status: DC | PRN
Start: 2021-02-14 — End: 2021-02-18

## 2021-02-14 MED ORDER — OXYTOCIN BOLUS FROM INFUSION: 333.0000 mL | Freq: Once | INTRAVENOUS | Status: DC

## 2021-02-14 MED ORDER — SOD CITRATE-CITRIC ACID 500-334 MG/5ML PO SOLN: 30.0000 mL | ORAL | Status: DC | PRN

## 2021-02-14 MED ORDER — ONDANSETRON HCL 4 MG/2ML IJ SOLN
4.0000 mg | Freq: Four times a day (QID) | INTRAMUSCULAR | Status: DC | PRN
  Administered 2021-02-14: 4 mg via INTRAVENOUS
  Filled 2021-02-14: qty 2

## 2021-02-14 MED ORDER — LABETALOL HCL 5 MG/ML IV SOLN: 80.0000 mg | INTRAVENOUS | Status: DC | PRN

## 2021-02-14 MED ORDER — LIDOCAINE HCL (PF) 1 % IJ SOLN
INTRAMUSCULAR | Status: AC
  Filled 2021-02-14: qty 30

## 2021-02-14 MED ORDER — LABETALOL HCL 5 MG/ML IV SOLN
20.0000 mg | INTRAVENOUS | Status: DC | PRN
  Administered 2021-02-17: 20 mg via INTRAVENOUS
  Filled 2021-02-14: qty 4

## 2021-02-14 MED ORDER — LACTATED RINGERS IV SOLN: 500.0000 mL | INTRAVENOUS | Status: DC | PRN

## 2021-02-14 MED ORDER — ACETAMINOPHEN 325 MG PO TABS: 650.0000 mg | ORAL_TABLET | ORAL | Status: DC | PRN

## 2021-02-14 MED ORDER — LACTATED RINGERS IV SOLN: INTRAVENOUS | Status: DC

## 2021-02-14 MED ORDER — AMMONIA AROMATIC IN INHA
RESPIRATORY_TRACT | Status: AC
  Filled 2021-02-14: qty 10

## 2021-02-14 MED ORDER — TERBUTALINE SULFATE 1 MG/ML IJ SOLN: 0.2500 mg | Freq: Once | INTRAMUSCULAR | Status: DC | PRN

## 2021-02-14 MED ORDER — OXYTOCIN-SODIUM CHLORIDE 30-0.9 UT/500ML-% IV SOLN: 2.5000 [IU]/h | INTRAVENOUS | Status: DC

## 2021-02-14 NOTE — Progress Notes (Signed)
  Labor Progress Note   30 y.o. G2P1001 @ [redacted]w[redacted]d , admitted for  Pregnancy, Labor Management.   Subjective:  BP under good control Mod pain w ctxs  Objective:  BP (!) 122/59   Pulse 80   Temp (!) 97.5 F (36.4 C) (Oral)   Resp 20   Ht 5\' 2"  (1.575 m)   Wt 107 kg   LMP 05/29/2020 Comment: approx [redacted] weeks pregnant   BMI 43.16 kg/m  Abd: gravid, ND, FHT present, moderate tenderness on exam Extr: trace to 1+ bilateral pedal edema SVE: CERVIX: FT cm dilated, 30 effaced, -3 station  EFM: FHR: 140 bpm, variability: minimal ,  accelerations:  Present,  decelerations:  Absent Toco: Frequency: Every 3-5 minutes Labs: I have reviewed the patient's lab results.   Assessment & Plan:  G2P1001 @ [redacted]w[redacted]d, admitted for  Pregnancy and Labor/Delivery Management  1. Pain management: none. 2. FWB: FHT category 1.  3. ID: GBS negative 4. Labor management: Pitocin 20 mU/min Discussed no cervical change Will cont for another 2-3 hours Consider cutting off and back on if still no change BP under good control, no s/sx preeclampsia currently other than h/o HTN and prior findings c/w preeclampsia throughout third trimester. Fetal tolerence of labor good right now  All discussed with patient, see orders  [redacted]w[redacted]d, MD, Annamarie Major Ob/Gyn, [facility] 02/14/2021  4:42 PM

## 2021-02-14 NOTE — Progress Notes (Signed)
  Labor Progress Note   30 y.o. G2P1001 @ [redacted]w[redacted]d , admitted for  Pregnancy, Labor Management.   Subjective:  IOL for cHTN w superimposed preeclampsia, unable to pass foley bulb for cervical ripening; on Pitocin 14 mU/min w mild ctx pains; denies nausea or bleeding  Objective:  BP 139/84 (BP Location: Right Arm)   Pulse 82   Temp (!) 97.5 F (36.4 C) (Oral)   Resp 20   Ht 5\' 2"  (1.575 m)   Wt 107 kg   LMP 05/29/2020 Comment: approx [redacted] weeks pregnant   BMI 43.16 kg/m  Abd: gravid, ND, FHT present, mild tenderness on exam Extr: trace to 1+ bilateral pedal edema SVE: deferred  EFM: FHR: 140 bpm, variability: moderate,  accelerations:  Present,  decelerations:  Absent Toco: Frequency: Every 3-5 minutes Labs: I have reviewed the patient's lab results.   No lab evidence for preeclampsia   UDS + MJ  05/31/2020 - Vtx  Assessment & Plan:  G2P1001 @ [redacted]w[redacted]d, admitted for  Pregnancy and Labor/Delivery Management  1. Pain management: none. 2. FWB: FHT category 1.  3. ID: GBS negative 4. Labor management: Cont Pitocin  All discussed with patient, see orders  [redacted]w[redacted]d, MD, Annamarie Major Ob/Gyn, [facility] 02/14/2021  1:56 PM

## 2021-02-14 NOTE — Progress Notes (Signed)
   02/14/21 2009  Provider Notification  Provider Name/Title Tiburcio Pea, MD  Time Provider Notified 2009  Method of Notification Phone  Request Evaluate - remote    Provider notified of patient's cervical exam status and was given telephone orders to give a pitocin breat for 3 hours and then restart. Patient may eat a regular diet during this time. TORB. Will notify patient of plan of care. see MAR.

## 2021-02-14 NOTE — H&P (Signed)
Obstetrics Admission History & Physical     HPI:  30 y.o. G2P1001 @ [redacted]w[redacted]d (03/05/2021, by Last Menstrual Period). Admitted on 02/14/2021:   Patient Active Problem List   Diagnosis Date Noted   Chronic hypertension affecting pregnancy 02/14/2021   [redacted] weeks gestation of pregnancy 02/14/2021   Abnormal glucose affecting pregnancy 01/05/2021   Chronic hypertension with superimposed preeclampsia 12/22/2020   Elevated blood pressure affecting pregnancy in third trimester, antepartum 12/20/2020   Pregnancy with history of cesarean section, antepartum 12/12/2020   Marijuana user 09/18/2020   Maternal morbid obesity in third trimester, antepartum (HCC) 09/08/2020   BMI 40.0-44.9, adult (HCC) 09/08/2020   Supervision of high risk pregnancy, antepartum 09/02/2020   Presents for induction of labor related to her cHTN w superimposed preeclampsia, under monitoring and medicine therapy for the last few months.  She denies headache, blurry vision, CP, SOB, epigastric pain, edema.  BP monitoring on Procardia 30 mg at home 130-140s/80s.  H/o HSV, no recent outbreak.  Prenatal [facility] at: at [facility]. Pregnancy complicated by chronic HTN, pre-eclampsia.  ROS: A review of systems was performed and negative, except as stated in the above HPI. PMHx:  Past Medical History:  Diagnosis Date   Acanthosis nigricans    Allergy    Anxiety    Depression    Dysmenorrhea    GERD (gastroesophageal reflux disease)    History of suicide attempt    Migraine without status migrainosus, not intractable    Obesity    PSHx:  Past Surgical History:  Procedure Laterality Date   BREAST LUMPECTOMY Left 2008   CESAREAN SECTION     Medications:  Medications Prior to Admission  Medication Sig Dispense Refill Last Dose   aspirin 81 MG chewable tablet Chew 81 mg by mouth daily.   Past Week   NIFEdipine (ADALAT CC) 30 MG 24 hr tablet Take 1 tablet (30 mg total) by mouth daily. 30 tablet 1 Past Week   Prenatal Vit-Fe  Fumarate-FA (PRENATAL MULTIVITAMIN) TABS tablet Take 1 tablet by mouth daily at 12 noon.   Past Week   valACYclovir (VALTREX) 1000 MG tablet Take 1 tablet (1,000 mg total) by mouth daily. 30 tablet 1 Past Week   Allergies: has No Known Allergies. OBHx:  OB History  Gravida Para Term Preterm AB Living  2 1 1     1   SAB IAB Ectopic Multiple Live Births          1    # Outcome Date GA Lbr Len/2nd Weight Sex Delivery Anes PTL Lv  2 Current           1 Term 03/23/11   2892 g F CS-Unspec      2893 except as detailed in HPI.KPV:VZSMOLMB/[id number]  No family history of birth defects. Soc Hx: Alcohol: none and Recreational drug use: none  Objective:   Vitals:   02/14/21 0833  BP: 140/88  Pulse: 99  Resp: 18  Temp: 98.3 F (36.8 C)   Constitutional: Well nourished, well developed female in no acute distress.  HEENT: normal Skin: Warm and dry.  Cardiovascular:Regular rate and rhythm.   Extremity: trace to 1+ bilateral pedal edema Respiratory: Clear to auscultation bilateral. Normal respiratory effort Abdomen: gravid, ND, FHT present, mild tenderness on exam Back: no CVAT Neuro: DTRs 2+, Cranial nerves grossly intact Psych: Alert and Oriented x3. No memory deficits. Normal mood and affect.  MS: normal gait, normal bilateral lower extremity ROM/strength/stability.  Pelvic exam: is not limited by body habitus  EGBUS: within normal limits Vagina: within normal limits and with normal mucosa Cervix: EXTERNAL GENITALIA: normal appearing vulva with no masses, tenderness or lesions CERVIX: FT dilated, 30 effaced, -3 station, presenting part Vtx MEMBRANES: intact Uterus: Uterus demonstrates irritability pattern.  Adnexa: not evaluated  Unable to pass foley catheter into cervix  EFM:FHR: 130 bpm, variability: moderate,  accelerations:  Present,  decelerations:  Absent Toco: occas ctx   Perinatal info:  Blood type: A positive Rubella- Not immune Varicella -Unknown TDaP tetanus  status unknown to the patient, Given during third trimester of this pregnancy RPR NR / HIV Neg/ HBsAg Neg   Assessment & Plan:   30 y.o. G2P1001 @ [redacted]w[redacted]d, Admitted on 02/14/2021:IOL for cHTN w superimposed preeclampsia    Admit for labor, Fetal Wellbeing Reassuring, Epidural when ready, AROM when Appropriate, and GBS status Negative  Discussed pros and cons of IOL, with added risks of prior CS 10 years ago.  VBAC risks discussed.  CS an option at any time, including now.  IOL may take a while due to low Bishop score and 37 weeks.  Pt desires VBAC if possible.  Will plan Pitocin and monitoring for ctxs and cervical change Labs for preeclampsia monitoring. Consider Magnesium in active labor  Annamarie Major, MD, [facility], [facility] 02/14/2021  9:33 AM

## 2021-02-15 ENCOUNTER — Encounter
Admission: RE | Disposition: A | Discharge status: Home / Self Care | Payer: Self-pay | Source: Home / Self Care | Attending: Obstetrics & Gynecology

## 2021-02-15 ENCOUNTER — Inpatient Hospital Stay: Payer: Medicaid Other | Admitting: Registered Nurse

## 2021-02-15 ENCOUNTER — Encounter: Payer: Self-pay | Admitting: Obstetrics and Gynecology

## 2021-02-15 DIAGNOSIS — Z3A37 37 weeks gestation of pregnancy: Secondary | ICD-10-CM | POA: Diagnosis not present

## 2021-02-15 DIAGNOSIS — O114 Pre-existing hypertension with pre-eclampsia, complicating childbirth: Principal | ICD-10-CM

## 2021-02-15 DIAGNOSIS — O34211 Maternal care for low transverse scar from previous cesarean delivery: Secondary | ICD-10-CM

## 2021-02-15 DIAGNOSIS — O1092 Unspecified pre-existing hypertension complicating childbirth: Secondary | ICD-10-CM | POA: Diagnosis not present

## 2021-02-15 LAB — RPR: RPR Ser Ql: NONREACTIVE

## 2021-02-15 SURGERY — Surgical Case
Anesthesia: Spinal

## 2021-02-15 MED ORDER — FERROUS SULFATE 325 (65 FE) MG PO TABS
325.0000 mg | ORAL_TABLET | Freq: Two times a day (BID) | ORAL | Status: DC
  Administered 2021-02-16 – 2021-02-18 (×5): 325 mg via ORAL
  Filled 2021-02-15 (×5): qty 1

## 2021-02-15 MED ORDER — DIPHENHYDRAMINE HCL 50 MG/ML IJ SOLN: 12.5000 mg | INTRAMUSCULAR | Status: DC | PRN

## 2021-02-15 MED ORDER — DIPHENHYDRAMINE HCL 25 MG PO CAPS: 25.0000 mg | ORAL_CAPSULE | ORAL | Status: DC | PRN

## 2021-02-15 MED ORDER — NALBUPHINE HCL 10 MG/ML IJ SOLN: 5.0000 mg | INTRAMUSCULAR | Status: DC | PRN

## 2021-02-15 MED ORDER — BUPIVACAINE IN DEXTROSE 0.75-8.25 % IT SOLN
INTRATHECAL | Status: DC | PRN
  Administered 2021-02-15: 1.6 mL via INTRATHECAL

## 2021-02-15 MED ORDER — OXYCODONE-ACETAMINOPHEN 5-325 MG PO TABS
1.0000 | ORAL_TABLET | ORAL | Status: DC | PRN
  Administered 2021-02-17: 1 via ORAL
  Filled 2021-02-15: qty 1

## 2021-02-15 MED ORDER — SENNOSIDES-DOCUSATE SODIUM 8.6-50 MG PO TABS
2.0000 | ORAL_TABLET | ORAL | Status: DC
  Administered 2021-02-15 – 2021-02-17 (×2): 2 via ORAL
  Filled 2021-02-15 (×2): qty 2

## 2021-02-15 MED ORDER — COCONUT OIL OIL: 1.0000 "application " | TOPICAL_OIL | Status: DC | PRN

## 2021-02-15 MED ORDER — BUPIVACAINE HCL (PF) 0.5 % IJ SOLN
INTRAMUSCULAR | Status: AC
  Filled 2021-02-15: qty 30

## 2021-02-15 MED ORDER — OXYCODONE-ACETAMINOPHEN 5-325 MG PO TABS
2.0000 | ORAL_TABLET | ORAL | Status: DC | PRN
  Administered 2021-02-17 – 2021-02-18 (×5): 2 via ORAL
  Filled 2021-02-15 (×5): qty 2

## 2021-02-15 MED ORDER — NIFEDIPINE ER OSMOTIC RELEASE 30 MG PO TB24
30.0000 mg | ORAL_TABLET | Freq: Every day | ORAL | Status: DC
  Administered 2021-02-16: 30 mg via ORAL
  Filled 2021-02-15: qty 1

## 2021-02-15 MED ORDER — DEXAMETHASONE SODIUM PHOSPHATE 10 MG/ML IJ SOLN
INTRAMUSCULAR | Status: DC | PRN
  Administered 2021-02-15: 10 mg via INTRAVENOUS

## 2021-02-15 MED ORDER — KETOROLAC TROMETHAMINE 30 MG/ML IJ SOLN
INTRAMUSCULAR | Status: AC
  Filled 2021-02-15: qty 1

## 2021-02-15 MED ORDER — SOD CITRATE-CITRIC ACID 500-334 MG/5ML PO SOLN: 30.0000 mL | ORAL | Status: AC

## 2021-02-15 MED ORDER — BUPIVACAINE HCL (PF) 0.5 % IJ SOLN
INTRAMUSCULAR | Status: DC | PRN
  Administered 2021-02-15: 10 mL

## 2021-02-15 MED ORDER — MEASLES, MUMPS & RUBELLA VAC IJ SOLR
0.5000 mL | Freq: Once | INTRAMUSCULAR | Status: DC
  Filled 2021-02-15: qty 0.5

## 2021-02-15 MED ORDER — MORPHINE SULFATE (PF) 0.5 MG/ML IJ SOLN
INTRAMUSCULAR | Status: AC
  Filled 2021-02-15: qty 10

## 2021-02-15 MED ORDER — IBUPROFEN 600 MG PO TABS
600.0000 mg | ORAL_TABLET | Freq: Four times a day (QID) | ORAL | Status: DC
  Administered 2021-02-16 – 2021-02-18 (×7): 600 mg via ORAL
  Filled 2021-02-15 (×7): qty 1

## 2021-02-15 MED ORDER — CALCIUM CARBONATE ANTACID 500 MG PO CHEW
2.0000 | CHEWABLE_TABLET | Freq: Every day | ORAL | Status: AC
  Administered 2021-02-15: 400 mg via ORAL
  Filled 2021-02-15: qty 2

## 2021-02-15 MED ORDER — NALBUPHINE HCL 10 MG/ML IJ SOLN: 5.0000 mg | Freq: Once | INTRAMUSCULAR | Status: DC | PRN

## 2021-02-15 MED ORDER — PRENATAL MULTIVITAMIN CH
1.0000 | ORAL_TABLET | Freq: Every day | ORAL | Status: DC
  Administered 2021-02-16 – 2021-02-18 (×3): 1 via ORAL
  Filled 2021-02-15 (×3): qty 1

## 2021-02-15 MED ORDER — OXYCODONE HCL 5 MG PO TABS
10.0000 mg | ORAL_TABLET | ORAL | Status: DC | PRN
  Administered 2021-02-16 (×2): 10 mg via ORAL
  Filled 2021-02-15: qty 2

## 2021-02-15 MED ORDER — OXYTOCIN-SODIUM CHLORIDE 30-0.9 UT/500ML-% IV SOLN
INTRAVENOUS | Status: AC
  Filled 2021-02-15: qty 500

## 2021-02-15 MED ORDER — KETOROLAC TROMETHAMINE 30 MG/ML IJ SOLN
30.0000 mg | Freq: Four times a day (QID) | INTRAMUSCULAR | Status: AC
  Filled 2021-02-15: qty 1

## 2021-02-15 MED ORDER — SIMETHICONE 80 MG PO CHEW
80.0000 mg | CHEWABLE_TABLET | Freq: Three times a day (TID) | ORAL | Status: DC
  Administered 2021-02-15 – 2021-02-18 (×9): 80 mg via ORAL
  Filled 2021-02-15 (×9): qty 1

## 2021-02-15 MED ORDER — ONDANSETRON HCL 4 MG/2ML IJ SOLN: 4.0000 mg | Freq: Three times a day (TID) | INTRAMUSCULAR | Status: DC | PRN

## 2021-02-15 MED ORDER — ACETAMINOPHEN 325 MG PO TABS
650.0000 mg | ORAL_TABLET | Freq: Four times a day (QID) | ORAL | Status: AC
  Administered 2021-02-15 – 2021-02-16 (×4): 650 mg via ORAL
  Filled 2021-02-15 (×4): qty 2

## 2021-02-15 MED ORDER — ONDANSETRON HCL 4 MG/2ML IJ SOLN
INTRAMUSCULAR | Status: AC
  Filled 2021-02-15: qty 2

## 2021-02-15 MED ORDER — CEFOTETAN DISODIUM 2 G IJ SOLR
2.0000 g | INTRAMUSCULAR | Status: AC
  Administered 2021-02-15: 2 g via INTRAVENOUS
  Filled 2021-02-15: qty 2

## 2021-02-15 MED ORDER — BUPIVACAINE 0.25 % ON-Q PUMP DUAL CATH 400 ML
400.0000 mL | INJECTION | Status: DC
  Filled 2021-02-15: qty 400

## 2021-02-15 MED ORDER — DIPHENHYDRAMINE HCL 25 MG PO CAPS: 25.0000 mg | ORAL_CAPSULE | Freq: Four times a day (QID) | ORAL | Status: DC | PRN

## 2021-02-15 MED ORDER — DEXAMETHASONE SODIUM PHOSPHATE 10 MG/ML IJ SOLN
INTRAMUSCULAR | Status: AC
  Filled 2021-02-15: qty 1

## 2021-02-15 MED ORDER — PHENYLEPHRINE HCL (PRESSORS) 10 MG/ML IV SOLN
INTRAVENOUS | Status: AC
  Filled 2021-02-15: qty 1

## 2021-02-15 MED ORDER — OXYCODONE HCL 5 MG PO TABS
5.0000 mg | ORAL_TABLET | ORAL | Status: DC | PRN
  Administered 2021-02-16: 5 mg via ORAL
  Filled 2021-02-15 (×3): qty 1

## 2021-02-15 MED ORDER — NALBUPHINE HCL 10 MG/ML IJ SOLN
5.0000 mg | INTRAMUSCULAR | Status: DC | PRN
Start: 2021-02-15 — End: 2021-02-18

## 2021-02-15 MED ORDER — KETOROLAC TROMETHAMINE 30 MG/ML IJ SOLN
30.0000 mg | Freq: Four times a day (QID) | INTRAMUSCULAR | Status: AC
  Administered 2021-02-15 – 2021-02-16 (×3): 30 mg via INTRAVENOUS
  Filled 2021-02-15 (×2): qty 1

## 2021-02-15 MED ORDER — LACTATED RINGERS IV SOLN: INTRAVENOUS | Status: DC

## 2021-02-15 MED ORDER — ONDANSETRON HCL 4 MG/2ML IJ SOLN
INTRAMUSCULAR | Status: DC | PRN
  Administered 2021-02-15 (×2): 4 mg via INTRAVENOUS

## 2021-02-15 MED ORDER — SOD CITRATE-CITRIC ACID 500-334 MG/5ML PO SOLN
ORAL | Status: AC
  Administered 2021-02-15: 30 mL via ORAL
  Filled 2021-02-15: qty 15

## 2021-02-15 MED ORDER — FENTANYL CITRATE (PF) 100 MCG/2ML IJ SOLN
INTRAMUSCULAR | Status: AC
  Filled 2021-02-15: qty 2

## 2021-02-15 MED ORDER — SODIUM CHLORIDE 0.9% FLUSH: 3.0000 mL | INTRAVENOUS | Status: DC | PRN

## 2021-02-15 MED ORDER — NALOXONE HCL 0.4 MG/ML IJ SOLN: 0.4000 mg | INTRAMUSCULAR | Status: DC | PRN

## 2021-02-15 MED ORDER — DIBUCAINE (PERIANAL) 1 % EX OINT: 1.0000 "application " | TOPICAL_OINTMENT | CUTANEOUS | Status: DC | PRN

## 2021-02-15 MED ORDER — KETOROLAC TROMETHAMINE 30 MG/ML IJ SOLN
INTRAMUSCULAR | Status: DC | PRN
  Administered 2021-02-15: 30 mg via INTRAVENOUS

## 2021-02-15 MED ORDER — NALOXONE HCL 4 MG/10ML IJ SOLN
1.0000 ug/kg/h | INTRAVENOUS | Status: DC | PRN
  Filled 2021-02-15: qty 5

## 2021-02-15 MED ORDER — FENTANYL CITRATE (PF) 100 MCG/2ML IJ SOLN
INTRAMUSCULAR | Status: DC | PRN
  Administered 2021-02-15: 15 ug via INTRATHECAL
  Administered 2021-02-15: 50 ug via INTRATHECAL
  Administered 2021-02-15: 35 ug via INTRATHECAL

## 2021-02-15 MED ORDER — WITCH HAZEL-GLYCERIN EX PADS: 1.0000 "application " | MEDICATED_PAD | CUTANEOUS | Status: DC | PRN

## 2021-02-15 MED ORDER — OXYTOCIN-SODIUM CHLORIDE 30-0.9 UT/500ML-% IV SOLN: 2.5000 [IU]/h | INTRAVENOUS | Status: AC

## 2021-02-15 MED ORDER — SODIUM CHLORIDE 0.9 % IV SOLN
INTRAVENOUS | Status: DC | PRN
  Administered 2021-02-15: 50 ug/min via INTRAVENOUS

## 2021-02-15 MED ORDER — MORPHINE SULFATE (PF) 0.5 MG/ML IJ SOLN
INTRAMUSCULAR | Status: DC | PRN
  Administered 2021-02-15: .1 mg via INTRATHECAL

## 2021-02-15 MED ORDER — MENTHOL 3 MG MT LOZG
1.0000 | LOZENGE | OROMUCOSAL | Status: DC | PRN
  Filled 2021-02-15: qty 9

## 2021-02-15 SURGICAL SUPPLY — 38 items
BACTOSHIELD CHG 4% 4OZ (MISCELLANEOUS) ×1
CATH KIT ON-Q SILVERSOAK 5IN (CATHETERS) ×4 IMPLANT
DERMABOND ADVANCED (GAUZE/BANDAGES/DRESSINGS) ×1
DERMABOND ADVANCED .7 DNX12 (GAUZE/BANDAGES/DRESSINGS) ×1 IMPLANT
DRSG OPSITE POSTOP 4X10 (GAUZE/BANDAGES/DRESSINGS) ×2 IMPLANT
DRSG TELFA 3X8 NADH (GAUZE/BANDAGES/DRESSINGS) ×2 IMPLANT
ELECT CAUTERY BLADE 6.4 (BLADE) ×2 IMPLANT
ELECT REM PT RETURN 9FT ADLT (ELECTROSURGICAL) ×2
ELECTRODE REM PT RTRN 9FT ADLT (ELECTROSURGICAL) ×1 IMPLANT
GAUZE SPONGE 4X4 12PLY STRL (GAUZE/BANDAGES/DRESSINGS) ×2 IMPLANT
GLOVE SS BIOGEL STRL SZ 6.5 (GLOVE) ×1 IMPLANT
GLOVE SUPERSENSE BIOGEL SZ 6.5 (GLOVE) ×1
GLOVE SURG ENC MOIS LTX SZ7 (GLOVE) ×2 IMPLANT
GLOVE SURG SYN 6.5 ES PF (GLOVE) ×4 IMPLANT
GLOVE SURG SYN 7.0 (GLOVE) ×2 IMPLANT
GLOVE SURG UNDER LTX SZ7.5 (GLOVE) ×2 IMPLANT
GOWN STRL REUS W/ TWL LRG LVL3 (GOWN DISPOSABLE) ×3 IMPLANT
GOWN STRL REUS W/TWL LRG LVL3 (GOWN DISPOSABLE) ×3
MANIFOLD NEPTUNE II (INSTRUMENTS) ×2 IMPLANT
MAT PREVALON FULL STRYKER (MISCELLANEOUS) ×2 IMPLANT
NS IRRIG 1000ML POUR BTL (IV SOLUTION) ×2 IMPLANT
PACK C SECTION AR (MISCELLANEOUS) ×2 IMPLANT
PAD OB MATERNITY 4.3X12.25 (PERSONAL CARE ITEMS) ×4 IMPLANT
PAD PREP 24X41 OB/GYN DISP (PERSONAL CARE ITEMS) ×2 IMPLANT
PENCIL SMOKE EVACUATOR (MISCELLANEOUS) ×2 IMPLANT
RETRACTOR TRAXI PANNICULUS (MISCELLANEOUS) ×1 IMPLANT
SCRUB CHG 4% DYNA-HEX 4OZ (MISCELLANEOUS) ×1 IMPLANT
STRIP CLOSURE SKIN 1/2X4 (GAUZE/BANDAGES/DRESSINGS) ×2 IMPLANT
SUT MNCRL 4-0 (SUTURE) ×1
SUT MNCRL 4-0 27XMFL (SUTURE) ×1
SUT PDS AB 1 TP1 96 (SUTURE) ×2 IMPLANT
SUT PLAIN 2 0 XLH (SUTURE) ×2 IMPLANT
SUT PLAIN GUT 0 (SUTURE) IMPLANT
SUT VIC AB 0 CTX 36 (SUTURE) ×5
SUT VIC AB 0 CTX36XBRD ANBCTRL (SUTURE) ×5 IMPLANT
SUTURE MNCRL 4-0 27XMF (SUTURE) ×1 IMPLANT
SWABSTK COMLB BENZOIN TINCTURE (MISCELLANEOUS) ×2 IMPLANT
TRAXI PANNICULUS RETRACTOR (MISCELLANEOUS) ×1

## 2021-02-15 NOTE — Progress Notes (Signed)
  Labor Progress Note   30 y.o. G2P1001 @ [redacted]w[redacted]d , admitted for  Pregnancy, Labor Management. IOL for cHTN.  Subjective:  No pain, resting at the moment She has been on Pitocin up to 20 mU/min, then a break, then restart (current 20 mU/min)  Objective:  BP (!) 144/69 (BP Location: Left Arm)   Pulse 73   Temp 98.2 F (36.8 C) (Oral)   Resp 15   Ht 5\' 2"  (1.575 m)   Wt 107 kg   LMP 05/29/2020 Comment: approx [redacted] weeks pregnant   BMI 43.16 kg/m  Abd: gravid, ND, FHT present, mild tenderness on exam Extr: trace to 1+ bilateral pedal edema SVE: CERVIX: last exam unchanged (0600)  EFM: FHR: 130 bpm, variability: moderate,  accelerations:  Present,  decelerations:  Absent Toco: irreg Labs: I have reviewed the patient's lab results.   Assessment & Plan:  G2P1001 @ [redacted]w[redacted]d, admitted for  Pregnancy and Labor/Delivery Management  1. Pain management: none. 2. FWB: FHT category 1.  3. ID: GBS negative 4. Labor management: Pitocin.  VBAC desired, yet no prostaglandins utilized for this IOL secondary to BP concerns this pregnancy. BP under good control, no s/sx preeclampsia currently other than h/o HTN and prior findings c/w preeclampsia throughout third trimester. Fetal tolerence of labor good right now Options for CS aware   All discussed with patient, see orders  [redacted]w[redacted]d, MD, Annamarie Major Ob/Gyn, [facility] 02/15/2021  7:24 AM

## 2021-02-15 NOTE — Transfer of Care (Signed)
Immediate Anesthesia Transfer of Care Note  Patient: Kristina Mccann  Procedure(s) Performed: CESAREAN SECTION  Patient Location: PACU  Anesthesia Type:Spinal  Level of Consciousness: awake and oriented  Airway & Oxygen Therapy: Patient Spontanous Breathing  Post-op Assessment: Report given to RN and Post -op Vital signs reviewed and stable  Post vital signs: Reviewed and stable  Last Vitals:  Vitals Value Taken Time  BP    Temp    Pulse    Resp    SpO2      Last Pain:  Vitals:   02/15/21 0701  TempSrc: Oral  PainSc: 0-No pain      Patients Stated Pain Goal: 0 (02/14/21 1200)  Complications: No notable events documented.

## 2021-02-15 NOTE — Discharge Summary (Signed)
Postpartum Discharge Summary    Patient Name: Kristina Mccann DOB: 06/27/1991 MRN: [id number]  Date of admission: 02/14/2021 Delivery date:02/15/2021  Delivering provider: Prentice Docker D  Date of discharge: 02/18/2021  Admitting diagnosis: Chronic hypertension affecting pregnancy [O10.919] Intrauterine pregnancy: [redacted]w[redacted]d    Secondary diagnosis:  Principal Problem:   Chronic hypertension with superimposed preeclampsia Active Problems:   Maternal morbid obesity in third trimester, antepartum ([place]   Chronic hypertension affecting pregnancy   [redacted] weeks gestation of pregnancy   Postpartum care following cesarean delivery  Additional problems: none    Discharge diagnosis: Term Pregnancy Delivered and CHTN                                              Post partum procedures: none Augmentation: N/A Complications: None  Hospital course: Induction of Labor With Cesarean Section   30y.o. yo G2P2002 at 388w3das admitted to the hospital 02/14/2021 for induction of labor. Patient had a labor course significant for more than 24 hours of pitocin-only induction with no change. The patient went for cesarean section due to Elective Repeat. Delivery details are as follows: Membrane Rupture Time/Date: 1:36 PM ,02/15/2021   Delivery Method:C-Section, Low Transverse  Details of operation can be found in separate operative Note.  Patient had an uncomplicated postpartum course. She is ambulating, tolerating a regular diet, passing flatus, and urinating well.  Patient is discharged home in stable condition on 02/18/21.      Newborn Data: Birth date:02/15/2021  Birth time:1:37 PM  Gender:Female  Living status:Living  Apgars:9 ,10  Weight:2440 g                                Magnesium Sulfate received: No BMZ received: No Rhophylac:N/A MMR:Yes T-DaP:Given prenatally Flu: No Transfusion:No  Physical exam  Vitals:   02/18/21 0314 02/18/21 0730 02/18/21 1123 02/18/21 1530  BP: (!) 141/81 135/85  132/83 126/84  Pulse: 88 89 79 90  Resp: _0 Temp: 98 F (36.7 C) 98.2 F (36.8 C) 98.1 F (36.7 C) 97.8 F (36.6 C)  TempSrc: Oral Oral Oral Oral  SpO2: 100% 95% 99% 99%  Weight:      Height:       General: alert, cooperative, and no distress Lochia: appropriate Uterine Fundus: firm Incision: Healing well with no significant drainage DVT Evaluation: No evidence of DVT seen on physical exam. Negative Homan's sign. Labs: Lab Results  Component Value Date   WBC 7.3 02/18/2021   HGB 9.3 (L) 02/18/2021   HCT 28.4 (L) 02/18/2021   MCV 87.7 02/18/2021   PLT 358 02/18/2021   CMP Latest Ref Rng & Units 02/17/2021  Glucose 70 - 99 mg/dL 80  BUN 6 - 20 mg/dL 8  Creatinine 0.44 - 1.00 mg/dL 0.73  Sodium 135 - 145 mmol/L 133(L)  Potassium 3.5 - 5.1 mmol/L 3.2(L)  Chloride 98 - 111 mmol/L 101  CO2 22 - 32 mmol/L 23  Calcium 8.9 - 10.3 mg/dL 8.5(L)  Total Protein 6.5 - 8.1 g/dL 6.9  Total Bilirubin 0.3 - 1.2 mg/dL 0.4  Alkaline Phos 38 - 126 U/L 75  AST 15 - 41 U/L 24  ALT 0 - 44 U/L 10   Edinburgh Score: Edinburgh Postnatal Depression Scale Screening Tool 02/16/2021  I have been able to laugh and see the funny side of things. 0  I have looked forward with enjoyment to things. 0  I have blamed myself unnecessarily when things went wrong. 2  I have been anxious or worried for no good reason. 2  I have felt scared or panicky for no good reason. 1  Things have been getting on top of me. 1  I have been so unhappy that I have had difficulty sleeping. 0  I have felt sad or miserable. 1  I have been so unhappy that I have been crying. 1  The thought of harming myself has occurred to me. 0  Edinburgh Postnatal Depression Scale Total 8      After visit meds:  Allergies as of 02/18/2021   No Known Allergies      Medication List     STOP taking these medications    aspirin 81 MG chewable tablet   valACYclovir 1000 MG tablet Commonly known as: VALTREX        TAKE these medications    ibuprofen 600 MG tablet Commonly known as: ADVIL Take 1 tablet (600 mg total) by mouth every 6 (six) hours.   NIFEdipine 60 MG 24 hr tablet Commonly known as: ADALAT CC Take 1 tablet (60 mg total) by mouth daily. Start taking on: February 19, 2021 What changed:  medication strength how much to take   oxyCODONE-acetaminophen 5-325 MG tablet Commonly known as: PERCOCET/ROXICET Take 1 tablet by mouth every 4 (four) hours as needed for moderate pain (pain score 4-7/10).   prenatal multivitamin Tabs tablet Take 1 tablet by mouth daily at 12 noon.               Discharge Care Instructions  (From admission, onward)           Start     Ordered   02/18/21 0000  Leave dressing on - Keep it clean, dry, and intact until clinic visit        02/18/21 1707   02/18/21 0000  Leave dressing on - Keep it clean, dry, and intact until clinic visit        02/18/21 1714   02/18/21 0000  Leave dressing on - Keep it clean, dry, and intact until clinic visit        02/18/21 1717             Discharge home in stable condition Infant Feeding: Breast Infant Disposition:home with mother Discharge instruction: per After Visit Summary and Postpartum booklet. Activity: Advance as tolerated. Pelvic rest for 6 weeks.  Diet: routine diet Anticipated Birth Control: Nexplanon Postpartum Appointment:1 week Additional Postpartum F/U: Incision check 1 week Future Appointments: Future Appointments  Date Time Provider Elmo  02/24/2021  2:00 PM Will Bonnet, MD [facility] None   Follow up Visit:  Follow-up Information     Will Bonnet, MD. Schedule an appointment as soon as possible for a visit in 1 week(s).   Specialty: Obstetrics and Gynecology Why: For incision check Contact information: [address] [place] [address] [phone]                 SIGNED: Imagene Riches, CNM  02/18/2021 5:17 PM

## 2021-02-15 NOTE — Op Note (Addendum)
Cesarean Section Operative Note    Patient Name: Kristina Mccann  MRN: [id number]  Date of Surgery: 02/15/2021   Pre-operative Diagnosis:  1) History of cesarean section, desires repeat 2) chronic hypertension affecting pregnancy, third trimester 3) intrauterine pregnancy at [redacted]w[redacted]d   Post-operative Diagnosis:  1) History of cesarean section, desires repeat 2) chronic hypertension affecting pregnancy, third trimester 3) intrauterine pregnancy at [redacted]w[redacted]d    Procedure: Repeat low transverse cesarean section via Pfannenstiel incision with double layer uterine closure  Surgeon: Surgeon(s) and Role:    Conard Novak, MD - Primary   Assistants: Paula Compton, CNM; No other capable assistant available, in surgery requiring high level assistant.  Anesthesia: spinal   Findings:  1) normal appearing gravid uterus, fallopian tubes, and ovaries 2) viable female infant with weight 2,440 grams, APGARs 9, 10   Quantified Blood Loss: 1,060 mL  Total IV Fluids: 1,000 ml   Urine Output:  50 mL clear urine at end of case  Specimens: none  Complications: no complications  Disposition: PACU - hemodynamically stable.   Maternal Condition: stable   Baby condition / location:  Couplet care / Skin to Skin  Procedure Details:  The patient was seen in the Holding Room. The risks, benefits, complications, treatment options, and expected outcomes were discussed with the patient. The patient concurred with the proposed plan, giving informed consent. identified as Celestial L Wendell and the procedure verified as C-Section Delivery. A Time Out was held and the above information confirmed.   After induction of anesthesia, the patient was draped and prepped in the usual sterile manner. A Pfannenstiel incision was made and carried down through the subcutaneous tissue to the fascia. Fascial incision was made and extended transversely. The fascia was separated from the underlying rectus tissue superiorly  and inferiorly. The peritoneum was identified and entered. Peritoneal incision was extended longitudinally. The bladder flap was bluntly and sharply freed from the lower uterine segment. A low transverse uterine incision was made and the hysterotomy was extended with cranial-caudal tension. Delivered from cephalic presentation was a 2,440 gram Living newborn infant(s) or Female with Apgar scores of 9 at one minute and 10 at five minutes. Cord ph was  not sent.  the umbilical cord was clamped and cut cord blood was not obtained for evaluation. The placenta was removed Intact and appeared normal. The uterine outline, tubes and ovaries appeared normal. The uterine incision was closed with running locked sutures of 0 Vicryl.  A second layer of the same suture was thrown in an imbricating fashion.  Multiple running and single-stitch throws were utilized to obtain hemostasis. The uterus was returned to the abdomen and the paracolic gutters were cleared of all clots and debris.  The rectus muscles were inspected and found to be hemostatic.  The On-Q catheter pumps were inserted in accordance with the manufacturer's recommendations.  The catheters were inserted approximately 4cm cephelad to the incision line, approximately 1cm apart, straddling the midline.  They were inserted to a depth of the 4th mark. They were positioned superficial to the rectus abdominus muscles and deep to the rectus fascia.    The fascia was then reapproximated with running sutures of 1-0 PDS, looped. The subcutaneous tissue was reapproximated using 2-0 plain gut such that no greater than 2cm of dead space remained. The subcuticular closure was performed using 4-0 monocryl. The skin closure was reinforced using surgical skin glue.  The On-Q catheters were bolused with 5 mL of 0.5% marcaine plain  for a total of 10 mL.  The catheters were affixed to the skin with surgical skin glue, steri-strips, and tegaderm.     The surgical assistant  performed tissue retraction, assistance with suturing, and fundal pressure.    Instrument, sponge, and needle counts were correct prior the abdominal closure and were correct at the conclusion of the case.  The patient received Ancef 2 gram IV prior to skin incision (within 30 minutes). For VTE prophylaxis she was wearing SCDs throughout the case.  The assistant surgeon was an MD due to lack of availability of another Sales promotion account executive.    Signed: Conard Novak, MD 02/15/2021 2:39 PM

## 2021-02-15 NOTE — Anesthesia Procedure Notes (Signed)
Spinal  Patient location during procedure: OR Start time: 02/15/2021 12:55 PM End time: 02/15/2021 1:01 PM Reason for block: surgical anesthesia Staffing Performed: resident/CRNA  Anesthesiologist: Penwarden, Amy, MD Resident/CRNA: Starr, Deana, CRNA Preanesthetic Checklist Completed: patient identified, IV checked, site marked, risks and benefits discussed, surgical consent, monitors and equipment checked, pre-op evaluation and timeout performed Spinal Block Patient position: sitting Prep: ChloraPrep and DuraPrep Patient monitoring: heart rate, cardiac monitor, continuous pulse ox and blood pressure Approach: midline Location: L3-4 Injection technique: single-shot Needle Needle type: Sprotte  Needle gauge: 24 G Needle length: 9 cm Assessment Sensory level: T4 Events: CSF return Additional Notes Negative paresthesia. Negative blood return. Positive free-flowing CSF. Expiration date of kit checked and confirmed. Patient tolerated procedure well, without complications.      

## 2021-02-15 NOTE — Progress Notes (Signed)
Kristina Mccann is a 30 y.o. G2P1001 at [redacted]w[redacted]d by ultrasound admitted for induction of labor due to Hypertension and Pre-eclamptic toxemia of pregnancy..  Subjective:She has been asleep in the bed. Denies contraction pain, just slight abdominal tightening.  She expresses desire for a VBAC but also shares that she would like to eat, and does not want another full daly iinduction if her chances for success are slim.   Objective: BP (!) 153/82 (BP Location: Left Arm)   Pulse 71   Temp 98.2 F (36.8 C) (Oral)   Resp 15   Ht 5\' 2"  (1.575 m)   Wt 107 kg   LMP 05/29/2020 Comment: approx [redacted] weeks pregnant   BMI 43.16 kg/m  I/O last 3 completed shifts: In: 880.3 [I.V.:880.3] Out: 650 [Urine:650] No intake/output data recorded.  FHT:  FHR: 125 bpm, variability: moderate,  accelerations:  Present,  decelerations:  Absent UC:   irregular, infrequent  mild SVE:   Dilation: Closed Effacement (%): Thick Station: -3 Exam by:: [email or web address], CNM  Labs: Lab Results  Component Value Date   WBC 7.2 02/14/2021   HGB 10.0 (L) 02/14/2021   HCT 30.3 (L) 02/14/2021   MCV 86.3 02/14/2021   PLT 277 02/14/2021    Assessment / Plan: Induction of labor due to preeclampsia and gestational hypertension,  progressing well on pitocin  Labor:  has been on pitocin  since yesterday, running at higher dose over many hours, with a break last evening . Patient has slept through hours of pitocin at 20 mu/min. Preeclampsia:  no signs or symptoms of toxicity and intake and ouput balanced Fetal Wellbeing:  Category I Pain Control:  Labor support without medications I/D:  n/a Anticipated MOD:   discussed her lastest cervical exam (closed ) after 24 hours of induction. Kristina Mccann expresses less interest in another full day of induction in light of her lack of cervical change. Offered the option of continuing with pitocin infusion till this evening versus planning on a repeat CS today. She prefers to proceed with delivery via  CS.  Dr. 04/17/2021 notified, and anesthesia contacted. Will proceed with repeat CS at approximately 12:30 today. Patient is tearful, but verbalizing desire to have her baby today, and acknowleges the unlikely chance of making labor progress.. She remains NPO.  Kristina Mccann, CNM  02/15/2021 10:03 AM    04/18/2021 Kristina Mccann 02/15/2021, 9:33 AM

## 2021-02-15 NOTE — Lactation Note (Signed)
This note was copied from a baby's chart. Lactation Consultation Note  Patient Name: Kristina Mccann MVHQI'[id number] Date: 02/15/2021   Age:29 hours  Lactation in to see mom in LDR6 2hrs after c-section delivery. This is mom's second baby, this one born at [redacted]w[redacted]d and <6lbs.   Mom BF her first for 1.77yrs, no major concerns noted, and baby Kristina has BF well 1x since delivery with Transition at bedside. Blood glucoses will be followed, first was 64.  Briefly reviewed with mom importance of feeding on demand, attempts of 8-12 feedings, hand expression/spoon feeding options, and skin to skin- with infants size, weeks gestation these can all be beneficial.  Information given on newborn stomach size, feeding patterns, and how to identify early cues. Mom is interested in pumping- discussed consistency of colostrum and benefit of hand expression over pump during this time, but option given for pump set-up tomorrow after some rest- mom agrees.  Encouraged to call for support, questions and concerns.   Maternal Data Does the patient have breastfeeding experience prior to this delivery?: Yes How long did the patient breastfeed?: 1.55yrs  Feeding    LATCH Score Latch: Repeated attempts needed to sustain latch, nipple held in mouth throughout feeding, stimulation needed to elicit sucking reflex.  Audible Swallowing: A few with stimulation  Type of Nipple: Everted at rest and after stimulation (large nipples and breasts)  Comfort (Breast/Nipple): Soft / non-tender  Hold (Positioning): Assistance needed to correctly position infant at breast and maintain latch.  LATCH Score: 7   Lactation Tools Discussed/Used    Interventions Interventions: Breast feeding basics reviewed;Assisted with latch;Hand express;Breast compression;Adjust position;Support pillows;Position options;Education  Discharge    Consult Status      Danford Bad 02/15/2021, 4:10 PM

## 2021-02-15 NOTE — Anesthesia Preprocedure Evaluation (Signed)
Anesthesia Evaluation  Patient identified by MRN, date of birth, ID band Patient awake    Reviewed: Allergy & Precautions, NPO status , Patient's Chart, lab work & pertinent test results  History of Anesthesia Complications Negative for: history of anesthetic complications  Airway Mallampati: III  TM Distance: >3 FB Neck ROM: Full    Dental no notable dental hx.    Pulmonary neg sleep apnea, neg COPD, former smoker,    breath sounds clear to auscultation- rhonchi (-) wheezing      Cardiovascular Exercise Tolerance: Good hypertension, Pt. on medications (-) CAD, (-) Past MI, (-) Cardiac Stents and (-) CABG  Rhythm:Regular Rate:Normal - Systolic murmurs and - Diastolic murmurs    Neuro/Psych  Headaches, neg Seizures PSYCHIATRIC DISORDERS Anxiety Depression    GI/Hepatic Neg liver ROS, GERD  ,  Endo/Other  negative endocrine ROSneg diabetes  Renal/GU negative Renal ROS     Musculoskeletal negative musculoskeletal ROS (+)   Abdominal (+) + obese,   Peds  Hematology negative hematology ROS (+)   Anesthesia Other Findings Past Medical History: No date: Acanthosis nigricans No date: Allergy No date: Anxiety No date: Depression No date: Dysmenorrhea No date: GERD (gastroesophageal reflux disease) No date: History of suicide attempt No date: Migraine without status migrainosus, not intractable No date: Obesity   Reproductive/Obstetrics (+) Pregnancy                             Lab Results  Component Value Date   WBC 7.2 02/14/2021   HGB 10.0 (L) 02/14/2021   HCT 30.3 (L) 02/14/2021   MCV 86.3 02/14/2021   PLT 277 02/14/2021    Anesthesia Physical Anesthesia Plan  ASA: 2  Anesthesia Plan: Spinal   Post-op Pain Management:    Induction:   PONV Risk Score and Plan: 2 and Ondansetron  Airway Management Planned: Natural Airway  Additional Equipment:   Intra-op Plan:    Post-operative Plan:   Informed Consent: I have reviewed the patients History and Physical, chart, labs and discussed the procedure including the risks, benefits and alternatives for the proposed anesthesia with the patient or authorized representative who has indicated his/her understanding and acceptance.     Dental advisory given  Plan Discussed with: CRNA and Anesthesiologist  Anesthesia Plan Comments:         Anesthesia Quick Evaluation

## 2021-02-16 ENCOUNTER — Encounter: Payer: Self-pay | Admitting: Obstetrics and Gynecology

## 2021-02-16 ENCOUNTER — Encounter: Payer: Medicaid Other | Admitting: Obstetrics & Gynecology

## 2021-02-16 DIAGNOSIS — G8918 Other acute postprocedural pain: Secondary | ICD-10-CM | POA: Diagnosis not present

## 2021-02-16 DIAGNOSIS — R1084 Generalized abdominal pain: Secondary | ICD-10-CM | POA: Diagnosis not present

## 2021-02-16 LAB — CBC
HCT: 26.5 % — ABNORMAL LOW (ref 36.0–46.0)
Hemoglobin: 8.8 g/dL — ABNORMAL LOW (ref 12.0–15.0)
MCH: 28.4 pg (ref 26.0–34.0)
MCHC: 33.2 g/dL (ref 30.0–36.0)
MCV: 85.5 fL (ref 80.0–100.0)
Platelets: 302 10*3/uL (ref 150–400)
RBC: 3.1 MIL/uL — ABNORMAL LOW (ref 3.87–5.11)
RDW: 14.6 % (ref 11.5–15.5)
WBC: 11.7 10*3/uL — ABNORMAL HIGH (ref 4.0–10.5)
nRBC: 0 % (ref 0.0–0.2)

## 2021-02-16 NOTE — Lactation Note (Signed)
This note was copied from a baby's chart. Lactation Consultation Note  Patient Name: Kristina Mccann [id number]'C Date: 02/16/2021 Reason for consult: Follow-up assessment;Early term 37-38.6wks;Infant < 6lbs;Other (Comment) (c-section) Age:29 hours  Lactation follow-up. Baby continued to feed well overnight, has stable blood glucose levels, and output that meets expectations.  Mom reports some concerns re: infants ability to latch deeply- mom has large nipples and baby has smaller mouth.  Mom desires a DEBP set-up in room for stimulation, milk removal, and possible bottle introduction. LC educated on the impact that bottle/artificial nipples may have on establishing a successful latch at the breast- mom verbalizes understanding.  Mom educated on set-up and break down of DEBP and parts/pieces, reviewed cleaning, milk storage, and pumping frequency. Mom also educated on the hand pump handle attachment if needed/desired.   Mom plans to pump periodically throughout the day. LC encouraged mom to continue Bf'ing on demand, and pumping post feedings to ensure adequate emptying and promotion of adequate supply. Reviewed milk supply and demand and normal course of lactation.  Reviewed feeding patterns and behaviors, and continued output expectations, provided signs that baby was getting enough, and reviewed early hunger cues.  Whiteboard updated with LC name/number, encouraged to call for positioning and latching support or assistance with pumping as needed today.  Maternal Data Has patient been taught Hand Expression?: Yes Does the patient have breastfeeding experience prior to this delivery?: Yes How long did the patient breastfeed?: 1.21yrs  Feeding Mother's Current Feeding Choice: Breast Milk  LATCH Score                    Lactation Tools Discussed/Used Tools: Pump Breast pump type: Double-Electric Breast Pump Pump Education: Setup, frequency, and cleaning;Milk Storage Reason  for Pumping: mom's choice Pumping frequency: q 3 hrs  Interventions Interventions: Breast feeding basics reviewed;Hand express;DEBP;Education  Discharge    Consult Status Consult Status: Follow-up Date: 02/16/21 Follow-up type: In-patient    Danford Bad 02/16/2021, 9:56 AM

## 2021-02-16 NOTE — Progress Notes (Signed)
Obstetric Postpartum/PostOperative Daily Progress Note Subjective:  30 y.o. G2P2002 post-operative day # 1 status post repeat cesarean delivery.  She is ambulating, is tolerating po, is voiding spontaneously.  Her pain is well controlled on PO pain medications and On Q pump. Her lochia is less than menses. She reports baby has had some difficulty latching and she is requesting a pump.   Medications SCHEDULED MEDICATIONS   acetaminophen  650 mg Oral Q6H   ferrous sulfate  325 mg Oral BID WC   ibuprofen  600 mg Oral Q6H   ketorolac  30 mg Intravenous Q6H   Or   ketorolac  30 mg Intramuscular Q6H   measles, mumps & rubella vaccine  0.5 mL Subcutaneous Once   NIFEdipine  30 mg Oral Daily   prenatal multivitamin  1 tablet Oral Q1200   senna-docusate  2 tablet Oral Q24H   simethicone  80 mg Oral TID PC    MEDICATION INFUSIONS   bupivacaine 0.25 % ON-Q pump DUAL CATH 400 mL     lactated ringers 125 mL/hr at 02/16/21 0340   naLOXone (NARCAN) adult infusion for PRURITIS     oxytocin      PRN MEDICATIONS  coconut oil, witch hazel-glycerin **AND** dibucaine, diphenhydrAMINE **OR** diphenhydrAMINE, diphenhydrAMINE, labetalol **AND** labetalol **AND** labetalol **AND** hydrALAZINE **AND** Measure blood pressure, menthol-cetylpyridinium, nalbuphine **OR** nalbuphine, nalbuphine **OR** nalbuphine, naloxone **AND** sodium chloride flush, naLOXone (NARCAN) adult infusion for PRURITIS, ondansetron (ZOFRAN) IV, oxyCODONE **OR** oxyCODONE, oxyCODONE-acetaminophen **OR** oxyCODONE-acetaminophen    Objective:   Vitals:   02/15/21 2016 02/15/21 2316 02/16/21 0345 02/16/21 0727  BP: 129/84 126/76 134/82 (!) 143/76  Pulse: 72 73 80 88  Resp: 20 20 20 15   Temp: 97.9 F (36.6 C) 98.2 F (36.8 C) 98.5 F (36.9 C) 98.4 F (36.9 C)  TempSrc: Oral Oral Oral Oral  SpO2: 99% 99% 98% 97%  Weight:      Height:        Current Vital Signs 24h Vital Sign Ranges  T 98.4 F (36.9 C) Temp  Avg: 98.1 F (36.7  C)  Min: 97.8 F (36.6 C)  Max: 98.5 F (36.9 C)  BP (!) 143/76  BP  Min: 123/67  Max: 158/88  HR 88 Pulse  Avg: 71.2  Min: 61  Max: 88  RR 15 Resp  Avg: 17.4  Min: 14  Max: 20  SaO2 97 % Room Air SpO2  Avg: 99 %  Min: 97 %  Max: 100 %       24 Hour I/O Current Shift I/O  Time Ins Outs 07/06 0701 - 07/07 0700 In: 1000 [I.V.:1000] Out: 2510 [Urine:1275] 07/07 0701 - 07/07 1900 In: -  Out: 550 [Urine:550]   General: NAD Pulmonary: no increased work of breathing Abdomen: non-distended, non-tender, fundus firm at level of umbilicus Inc: Clean/dry/intact, On Q pump intact Extremities: no edema, no erythema, no tenderness  Labs:  Recent Labs  Lab 02/14/21 0821 02/16/21 0426  WBC 7.2 11.7*  HGB 10.0* 8.8*  HCT 30.3* 26.5*  PLT 277 302     Assessment:   30 y.o. G2P2002 postoperative day # 1 status post repeat cesarean section, lactating  Plan:  1) Acute blood loss anemia - hemodynamically stable and asymptomatic - po ferrous sulfate  2) CHTN/superimposed Preeclampsia: Procardia 30 mg XL daily  3) A POS / Rubella <0.90 (03/01 1145)/ Varicella Not immune  4) TDAP status needs prior to discharge  5) Breastfeeding; LC aware that patient is requesting pump  6) Contraception =  Nexplanon  7) Disposition: continue current care   Tresea Mall, CNM 02/16/2021 9:55 AM

## 2021-02-16 NOTE — Anesthesia Postprocedure Evaluation (Signed)
Anesthesia Post Note  Patient: Kristina Mccann  Procedure(s) Performed: CESAREAN SECTION  Patient location during evaluation: Mother Baby Anesthesia Type: Spinal Level of consciousness: oriented and awake and alert Pain management: pain level controlled Vital Signs Assessment: post-procedure vital signs reviewed and stable Respiratory status: spontaneous breathing and respiratory function stable Cardiovascular status: blood pressure returned to baseline and stable Postop Assessment: no headache, no backache, no apparent nausea or vomiting and able to ambulate Anesthetic complications: no   No notable events documented.   Last Vitals:  Vitals:   02/16/21 0345 02/16/21 0727  BP: 134/82 (!) 143/76  Pulse: 80 88  Resp: 20 15  Temp: 36.9 C 36.9 C  SpO2: 98% 97%    Last Pain:  Vitals:   02/16/21 0727  TempSrc: Oral  PainSc:                  Starling Manns

## 2021-02-16 NOTE — TOC Initial Note (Signed)
Transition of Care [facility]) - Initial/Assessment Note    Patient Details  Name: Kristina Mccann MRN: [id number] Date of Birth: 1991-04-20  Transition of Care [facility]) CM/SW Contact:    Hetty Ely, RN Phone Number: 02/16/2021, 11:46 AM  Clinical Narrative:  Newborn drug screen positive for Marijuana, spoke with mother who voices using it to relieve nausea. Mother voices no problems with discontinuing and will not accept SA resource list. Mother states she has everything she needs for the baby, will do both breast and bottle feeding, discussed the need to discontinue marijuana use for breast feeding, voices understanding. Patient lives alone with 29 year old in the home, says the father is not in the home however they are cordial for the kids. Patient states she has a Barrister's clerk, WIC and SNAP benefits. Works full-time as a Photographer at [employer], and plan to return when medically cleared. Understands that DSS will be notified of newborn positive drug screen and to be available by phone when called.  [place] DSS was called and report given to Intake clerk. No other TOC needs identified at this time.                                   Patient Goals and CMS Choice        Expected Discharge Plan and Services                                                Prior Living Arrangements/Services                       Activities of Daily Living Home Assistive Devices/Equipment: None ADL Screening (condition at time of admission) Patient's cognitive ability adequate to safely complete daily activities?: Yes Is the patient deaf or have difficulty hearing?: No Does the patient have difficulty seeing, even when wearing glasses/contacts?: No Does the patient have difficulty concentrating, remembering, or making decisions?: No Patient able to express need for assistance with ADLs?: Yes Does the patient have difficulty dressing or bathing?:  No Independently performs ADLs?: Yes (appropriate for developmental age) Does the patient have difficulty walking or climbing stairs?: No Weakness of Legs: None Weakness of Arms/Hands: None  Permission Sought/Granted                  Emotional Assessment              Admission diagnosis:  Chronic hypertension affecting pregnancy [O10.919] Patient Active Problem List   Diagnosis Date Noted   Chronic hypertension affecting pregnancy 02/14/2021   [redacted] weeks gestation of pregnancy 02/14/2021   Abnormal glucose affecting pregnancy 01/05/2021   Chronic hypertension with superimposed preeclampsia 12/22/2020   Elevated blood pressure affecting pregnancy in third trimester, antepartum 12/20/2020   Pregnancy with history of cesarean section, antepartum 12/12/2020   Marijuana user 09/18/2020   Maternal morbid obesity in third trimester, antepartum (HCC) 09/08/2020   BMI 40.0-44.9, adult (HCC) 09/08/2020   Supervision of high risk pregnancy, antepartum 09/02/2020   Lives in sheltered housing 04/04/2018   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 [place] [address] Phone: [phone] Fax: [phone]     Social Determinants of Health (SDOH) Interventions    Readmission Risk Interventions No flowsheet data found.

## 2021-02-16 NOTE — Anesthesia Post-op Follow-up Note (Signed)
  Anesthesia Pain Follow-up Note  Patient: Kristina Mccann  Day #: 1  Date of Follow-up: 02/16/2021 Time: 8:08 AM  Last Vitals:  Vitals:   02/16/21 0345 02/16/21 0727  BP: 134/82 (!) 143/76  Pulse: 80 88  Resp: 20 15  Temp: 36.9 C 36.9 C  SpO2: 98% 97%    Level of Consciousness: alert  Pain: none   Side Effects:None  Catheter Site Exam:clean, dry, no drainage     Plan: D/C from anesthesia care at surgeon's request  Starling Manns

## 2021-02-17 ENCOUNTER — Telehealth: Payer: Self-pay

## 2021-02-17 LAB — CBC
HCT: 26.9 % — ABNORMAL LOW (ref 36.0–46.0)
Hemoglobin: 9.1 g/dL — ABNORMAL LOW (ref 12.0–15.0)
MCH: 28.8 pg (ref 26.0–34.0)
MCHC: 33.8 g/dL (ref 30.0–36.0)
MCV: 85.1 fL (ref 80.0–100.0)
Platelets: 298 10*3/uL (ref 150–400)
RBC: 3.16 MIL/uL — ABNORMAL LOW (ref 3.87–5.11)
RDW: 14.9 % (ref 11.5–15.5)
WBC: 8.8 10*3/uL (ref 4.0–10.5)
nRBC: 0 % (ref 0.0–0.2)

## 2021-02-17 LAB — COMPREHENSIVE METABOLIC PANEL
ALT: 10 U/L (ref 0–44)
AST: 24 U/L (ref 15–41)
Albumin: 2.8 g/dL — ABNORMAL LOW (ref 3.5–5.0)
Alkaline Phosphatase: 75 U/L (ref 38–126)
Anion gap: 9 (ref 5–15)
BUN: 8 mg/dL (ref 6–20)
CO2: 23 mmol/L (ref 22–32)
Calcium: 8.5 mg/dL — ABNORMAL LOW (ref 8.9–10.3)
Chloride: 101 mmol/L (ref 98–111)
Creatinine, Ser: 0.73 mg/dL (ref 0.44–1.00)
GFR, Estimated: >60 mL/min (ref >60)
Glucose, Bld: 80 mg/dL (ref 70–99)
Potassium: 3.2 mmol/L — ABNORMAL LOW (ref 3.5–5.1)
Sodium: 133 mmol/L — ABNORMAL LOW (ref 135–145)
Total Bilirubin: 0.4 mg/dL (ref 0.3–1.2)
Total Protein: 6.9 g/dL (ref 6.5–8.1)

## 2021-02-17 LAB — PROTEIN / CREATININE RATIO, URINE
Creatinine, Urine: 136 mg/dL
Protein Creatinine Ratio: 0.15 mg/mg{Cre} (ref 0.00–0.15)
Total Protein, Urine: 21 mg/dL

## 2021-02-17 MED ORDER — VARICELLA VIRUS VACCINE LIVE 1350 PFU/0.5ML IJ SUSR
0.5000 mL | Freq: Once | INTRAMUSCULAR | Status: DC
  Filled 2021-02-17: qty 0.5

## 2021-02-17 MED ORDER — NIFEDIPINE ER OSMOTIC RELEASE 30 MG PO TB24
60.0000 mg | ORAL_TABLET | Freq: Every day | ORAL | Status: DC
  Administered 2021-02-17 – 2021-02-18 (×2): 60 mg via ORAL
  Filled 2021-02-17 (×2): qty 2

## 2021-02-17 NOTE — Lactation Note (Addendum)
This note was copied from a baby's chart. Lactation Consultation Note  Patient Name: Girl Kaileen Bronkema [id number] Date: 02/17/2021 Reason for consult: Follow-up assessment;Early term 37-38.6wks Age:29 hours  Maternal Data    Feeding Mother's Current Feeding Choice: Breast Milk Mom has just fed baby on left breast, currently nursing on right breast in cradle hold, wide open latch, swallows heard and seen   LATCH Score Latch: Grasps breast easily, tongue down, lips flanged, rhythmical sucking.  Audible Swallowing: Spontaneous and intermittent  Type of Nipple: Everted at rest and after stimulation  Comfort (Breast/Nipple): Filling, red/small blisters or bruises, mild/mod discomfort  Hold (Positioning): No assistance needed to correctly position infant at breast.  LATCH Score: 9   Lactation Tools Discussed/Used  LC name and no written on white board  Interventions Interventions: Education;Coconut oil  Discharge Pump: DEBP;Manual WIC Program: Yes  Consult Status Consult Status: PRN Date: 02/17/21 Follow-up type: In-patient    Dyann Kief 02/17/2021, 12:31 PM

## 2021-02-17 NOTE — Progress Notes (Addendum)
Obstetric Postpartum/PostOperative Daily Progress Note Subjective:  30 y.o. [id number] post-operative day # 2 status post repeat cesarean delivery.  She is ambulating, is tolerating po, is voiding spontaneously.  Her pain is well controlled on PO pain medications. Her lochia is less than menses. She reports breastfeeding is going well. She denies any s/s of HTN. Procardia dose was increased this morning to 60 mg XL daily. Labs repeated due to elevated BP today. Patient aware of care plan including possibility of IV magnesium if pressures become severe range or if labs are abnormal. We may also add another anti-hypertensive agent if needed.    Medications SCHEDULED MEDICATIONS   ferrous sulfate  325 mg Oral BID WC   ibuprofen  600 mg Oral Q6H   measles, mumps & rubella vaccine  0.5 mL Subcutaneous Once   NIFEdipine  60 mg Oral Daily   prenatal multivitamin  1 tablet Oral Q1200   senna-docusate  2 tablet Oral Q24H   simethicone  80 mg Oral TID PC   varicella virus vaccine live  0.5 mL Subcutaneous Once    MEDICATION INFUSIONS   bupivacaine 0.25 % ON-Q pump DUAL CATH 400 mL     lactated ringers Stopped (02/16/21 0900)   naLOXone (NARCAN) adult infusion for PRURITIS      PRN MEDICATIONS  coconut oil, witch hazel-glycerin **AND** dibucaine, diphenhydrAMINE **OR** diphenhydrAMINE, diphenhydrAMINE, labetalol **AND** labetalol **AND** labetalol **AND** hydrALAZINE **AND** Measure blood pressure, menthol-cetylpyridinium, nalbuphine **OR** nalbuphine, nalbuphine **OR** nalbuphine, naloxone **AND** sodium chloride flush, naLOXone (NARCAN) adult infusion for PRURITIS, ondansetron (ZOFRAN) IV, oxyCODONE **OR** oxyCODONE, oxyCODONE-acetaminophen **OR** oxyCODONE-acetaminophen    Objective:   Vitals:   02/17/21 1220 02/17/21 1249 02/17/21 1310 02/17/21 1456  BP: (!) 164/94 (!) 159/115 (!) 146/88 (!) 142/82  Pulse:  89 84 95  Resp:    16  Temp:    98.6 F (37 C)  TempSrc:    Oral  SpO2:    100%   Weight:      Height:        Current Vital Signs 24h Vital Sign Ranges  T 98.6 F (37 C) Temp  Avg: 98.3 F (36.8 C)  Min: 97.7 F (36.5 C)  Max: 98.6 F (37 C)  BP (!) 142/82  BP  Min: 141/97  Max: 167/91  HR 95 Pulse  Avg: 87  Min: 71  Max: 97  RR 16 Resp  Avg: 17.3  Min: 14  Max: 20  SaO2 100 % Room Air SpO2  Avg: 98.3 %  Min: 94 %  Max: 100 %       24 Hour I/O Current Shift I/O  Time Ins Outs 07/07 0701 - 07/08 0700 In: 433.1 [I.V.:433.1] Out: 550 [Urine:550] No intake/output data recorded.   General: NAD Pulmonary: no increased work of breathing Abdomen: non-distended, non-tender, fundus firm at level of umbilicus Inc: Clean/dry/intact, On Q with small amount of sang drainage Extremities: no edema, no erythema, no tenderness  Labs:  Recent Labs  Lab 02/14/21 0821 02/16/21 0426 02/17/21 1351  WBC 7.2 11.7* 8.8  HGB 10.0* 8.8* 9.1*  HCT 30.3* 26.5* 26.9*  PLT 277 302 298    Results for APRYLE, STOWELL (MRN [id number]) as of 02/17/2021 15:27  Ref. Range 02/17/2021 13:51  COMPREHENSIVE METABOLIC PANEL Unknown Rpt (A)  Sodium Latest Ref Range: 135 - 145 mmol/L 133 (L)  Potassium Latest Ref Range: 3.5 - 5.1 mmol/L 3.2 (L)  Chloride Latest Ref Range: 98 - 111 mmol/L 101  CO2 Latest  Ref Range: 22 - 32 mmol/L 23  Glucose Latest Ref Range: 70 - 99 mg/dL 80  BUN Latest Ref Range: 6 - 20 mg/dL 8  Creatinine Latest Ref Range: 0.44 - 1.00 mg/dL 5.09  Calcium Latest Ref Range: 8.9 - 10.3 mg/dL 8.5 (L)  Anion gap Latest Ref Range: 5 - 15  9  Alkaline Phosphatase Latest Ref Range: 38 - 126 U/L 75  Albumin Latest Ref Range: 3.5 - 5.0 g/dL 2.8 (L)  AST Latest Ref Range: 15 - 41 U/L 24  ALT Latest Ref Range: 0 - 44 U/L 10  Total Protein Latest Ref Range: 6.5 - 8.1 g/dL 6.9  Total Bilirubin Latest Ref Range: 0.3 - 1.2 mg/dL 0.4  GFR, Estimated Latest Ref Range: >60 mL/min >60  WBC Latest Ref Range: 4.0 - 10.5 K/uL 8.8  RBC Latest Ref Range: 3.87 - 5.11 MIL/uL 3.16 (L)   Hemoglobin Latest Ref Range: 12.0 - 15.0 g/dL 9.1 (L)  HCT Latest Ref Range: 36.0 - 46.0 % 26.9 (L)  MCV Latest Ref Range: 80.0 - 100.0 fL 85.1  MCH Latest Ref Range: 26.0 - 34.0 pg 28.8  MCHC Latest Ref Range: 30.0 - 36.0 g/dL 32.6  RDW Latest Ref Range: 11.5 - 15.5 % 14.9  Platelets Latest Ref Range: 150 - 400 K/uL 298  nRBC Latest Ref Range: 0.0 - 0.2 % 0.0   Protein Creatinine Ratio pending  Assessment:   30 y.o. [id number] postoperative day # 2 status post repeat cesarean section, lactating  Plan:  1) Acute blood loss anemia - hemodynamically stable and asymptomatic - po ferrous sulfate  2) CHTN with superimposed preeclampsia: Procardia 60 mg XL, repeat PIH labs, IV labetalol for severe range, IV magnesium if severe range, consider adding additional anti-hypertensive  3) A POS / Rubella <0.90 (03/01 1145)/ Varicella Not immune  4) TDAP status needs prior to discharge  5) Breastfeeding/Contraception =  Nexplanon  6) Disposition: continue current care   Tresea Mall, CNM 02/17/2021 3:20 PM

## 2021-02-17 NOTE — Progress Notes (Signed)
Pt feeling well this pm    Denies headache, CP, SOB, blurry vision, epig pain    Min pain from CS BP- some elevated today; one severe range high BP Labs reassuring for PIH cHTN, no sign of preeclampsia  Procardia 60 Consider second agent Will not treat w magnesium at this time Cont to monitor overnight  Annamarie Major, MD, [facility], [facility] 02/17/2021  5:14 PM

## 2021-02-17 NOTE — Telephone Encounter (Signed)
Veronica from [employer] called and left msg on triage needing delivery date and type for pt. CB #: [id number]. Called back, spoke to [place] and info given.

## 2021-02-18 LAB — CBC WITH DIFFERENTIAL/PLATELET
Abs Immature Granulocytes: 0.04 10*3/uL (ref 0.00–0.07)
Basophils Absolute: 0 10*3/uL (ref 0.0–0.1)
Basophils Relative: 0 %
Eosinophils Absolute: 0.2 10*3/uL (ref 0.0–0.5)
Eosinophils Relative: 3 %
HCT: 28.4 % — ABNORMAL LOW (ref 36.0–46.0)
Hemoglobin: 9.3 g/dL — ABNORMAL LOW (ref 12.0–15.0)
Immature Granulocytes: 1 %
Lymphocytes Relative: 24 %
Lymphs Abs: 1.7 10*3/uL (ref 0.7–4.0)
MCH: 28.7 pg (ref 26.0–34.0)
MCHC: 32.7 g/dL (ref 30.0–36.0)
MCV: 87.7 fL (ref 80.0–100.0)
Monocytes Absolute: 0.6 10*3/uL (ref 0.1–1.0)
Monocytes Relative: 8 %
Neutro Abs: 4.7 10*3/uL (ref 1.7–7.7)
Neutrophils Relative %: 64 %
Platelets: 358 10*3/uL (ref 150–400)
RBC: 3.24 MIL/uL — ABNORMAL LOW (ref 3.87–5.11)
RDW: 15.2 % (ref 11.5–15.5)
WBC: 7.3 10*3/uL (ref 4.0–10.5)
nRBC: 0 % (ref 0.0–0.2)

## 2021-02-18 MED ORDER — NIFEDIPINE ER 60 MG PO TB24: 60.0000 mg | ORAL_TABLET | Freq: Every day | ORAL | 2 refills | Status: DC

## 2021-02-18 MED ORDER — IBUPROFEN 600 MG PO TABS
600.0000 mg | ORAL_TABLET | Freq: Four times a day (QID) | ORAL | Status: DC
  Administered 2021-02-18: 600 mg via ORAL
  Filled 2021-02-18: qty 1

## 2021-02-18 MED ORDER — OXYCODONE-ACETAMINOPHEN 5-325 MG PO TABS: 1.0000 | ORAL_TABLET | ORAL | 0 refills | Status: DC | PRN

## 2021-02-18 MED ORDER — IBUPROFEN 600 MG PO TABS: 600.0000 mg | ORAL_TABLET | Freq: Four times a day (QID) | ORAL | 0 refills | Status: DC

## 2021-02-18 NOTE — Progress Notes (Signed)
Subjective: Postpartum Day 3: Cesarean Delivery Patient reports tolerating PO, + flatus, and no problems voiding.  Her blood pressures were elevated lyesterday, and required IV meds to address.Has been normotensive since last evening. Vital signs generally are better when she is resting in bed.She is breastfeeding, and the baby has dropped over 10% in weight since delivery, and is requiring supplementation.  Objective: Vital signs in last 24 hours: Temp:  [97.8 F (36.6 C)-98.6 F (37 C)] 98.2 F (36.8 C) (07/09 0730) Pulse Rate:  [84-102] 89 (07/09 0730) Resp:  [16-20] 18 (07/09 0730) BP: (121-164)/(75-115) 135/85 (07/09 0730) SpO2:  [95 %-100 %] 95 % (07/09 0730)  Physical Exam:  General: alert, cooperative, no distress, and morbidly obese Breasts- pendulous, nipples flatten slightly  with "C test". Lochia: appropriate Uterine Fundus: firm Incision: healing well, no significant drainage DVT Evaluation: No evidence of DVT seen on physical exam. Negative Homan's sign. No cords or calf tenderness.  Recent Labs    02/16/21 0426 02/17/21 1351  HGB 8.8* 9.1*  HCT 26.5* 26.9*    Assessment/Plan: Status post Cesarean section. Postoperative course complicated by hypertension, requiring IV medication, low H and H, and lactation challenges.   Continue current care.Will monitor her blood pressures this morning, and early afternoon. Would consider discharge based on BP readings today. CBC ordered for this afternoon.  Mirna Mires 02/18/2021, 10:54 AM

## 2021-02-18 NOTE — Progress Notes (Signed)
Patient discharged with infant. Discharge instructions, prescriptions, and follow up appointments given to and reviewed with patient. Patient verbalized understanding. OnQ pump and IV removed. Will be escorted out by axillary.

## 2021-02-20 ENCOUNTER — Telehealth: Payer: Self-pay

## 2021-02-20 NOTE — Telephone Encounter (Signed)
Transition Care Management Follow-up Telephone Call Date of discharge and from where: 02/18/2021-[facility] How have you been since you were released from the hospital? Feeling better Any questions or concerns? No  Items Reviewed: Did the pt receive and understand the discharge instructions provided? Yes  Medications obtained and verified? Yes  Other? No  Any new allergies since your discharge? No  Dietary orders reviewed? N/A Do you have support at home? Yes   Home Care and Equipment/Supplies: Were home health services ordered? not applicable If so, what is the name of the agency? N/A  Has the agency set up a time to come to the patient's home? not applicable Were any new equipment or medical supplies ordered?  No What is the name of the medical supply agency? N/A Were you able to get the supplies/equipment? not applicable Do you have any questions related to the use of the equipment or supplies? No  Functional Questionnaire: (I = Independent and D = Dependent) ADLs: I  Bathing/Dressing- I  Meal Prep- I  Eating- I  Maintaining continence- I  Transferring/Ambulation- I  Managing Meds- I  Follow up appointments reviewed:  PCP Hospital f/u appt confirmed? No  patient advised to continue routine appointments with PCP Specialist f/u appt confirmed? Yes  Scheduled to see Dr. [name] on 02/24/2021 @ 2:00 PM. Are transportation arrangements needed? No  If their condition worsens, is the pt aware to call PCP or go to the Emergency Dept.? Yes Was the patient provided with contact information for the PCP's office or ED? Yes Was to pt encouraged to call back with questions or concerns? Yes

## 2021-02-21 ENCOUNTER — Other Ambulatory Visit: Payer: Medicaid Other

## 2021-02-24 ENCOUNTER — Ambulatory Visit (INDEPENDENT_AMBULATORY_CARE_PROVIDER_SITE_OTHER): Payer: Medicaid Other | Admitting: Obstetrics and Gynecology

## 2021-02-24 ENCOUNTER — Encounter: Payer: Self-pay | Admitting: Obstetrics and Gynecology

## 2021-02-24 ENCOUNTER — Inpatient Hospital Stay: Payer: Medicaid Other | Admitting: Family Medicine

## 2021-02-24 ENCOUNTER — Other Ambulatory Visit: Payer: Self-pay

## 2021-02-24 VITALS — BP 130/80 | Ht 62.0 in | Wt 216.0 lb

## 2021-02-24 DIAGNOSIS — Z09 Encounter for follow-up examination after completed treatment for conditions other than malignant neoplasm: Secondary | ICD-10-CM

## 2021-02-24 NOTE — Progress Notes (Signed)
   Postoperative Follow-up Patient presents post op from cesarean section  9days ago.  Subjective: She denies fever, chills, nausea, and vomiting. Eating a regular diet without difficulty. The patient is not having any pain.  Activity: increasing slowly. She denies issues with her incision.    Her EPDS today is 1.  She is taking nifedipine XL 60 mg daily without issues.   Objective: BP 130/80   Ht 5\' 2"  (1.575 m)   Wt 216 lb (98 kg)   Breastfeeding Yes   BMI 39.51 kg/m   Physical Exam Constitutional:      General: She is not in acute distress.    Appearance: Normal appearance.  HENT:     Head: Normocephalic and atraumatic.  Eyes:     General: No scleral icterus.    Conjunctiva/sclera: Conjunctivae normal.  Abdominal:     General: There is no distension.     Palpations: Abdomen is soft. There is mass (uterine fundus at U-2).     Tenderness: There is no abdominal tenderness. There is no guarding or rebound.     Comments: Incision: without erythema, induration, warmth, and tenderness. It is clean, dry, and intact.     Neurological:     General: No focal deficit present.     Mental Status: She is alert and oriented to person, place, and time.     Cranial Nerves: No cranial nerve deficit.  Psychiatric:        Mood and Affect: Mood normal.        Behavior: Behavior normal.        Judgment: Judgment normal.    Assessment: 30 y.o. s/p cesarean section progressing well  Plan: Patient has done well after surgery with no apparent complications.  I have discussed the post-operative course to date, and the expected progress moving forward.  The patient understands what complications to be concerned about.    Activity plan: increase slowly  Return in about 5 weeks (around 03/31/2021) for Six Week Postpartum w Nexplanon insertion.  04/02/2021, MD 02/24/2021 2:24 PM

## 2021-02-28 ENCOUNTER — Other Ambulatory Visit: Payer: Medicaid Other

## 2021-03-13 ENCOUNTER — Encounter: Payer: Self-pay | Admitting: Obstetrics and Gynecology

## 2021-03-13 ENCOUNTER — Telehealth: Payer: Self-pay

## 2021-03-13 ENCOUNTER — Ambulatory Visit (INDEPENDENT_AMBULATORY_CARE_PROVIDER_SITE_OTHER): Payer: Medicaid Other | Admitting: Obstetrics and Gynecology

## 2021-03-13 ENCOUNTER — Other Ambulatory Visit: Payer: Self-pay

## 2021-03-13 VITALS — BP 130/72 | Ht 62.0 in | Wt 219.4 lb

## 2021-03-13 DIAGNOSIS — Z09 Encounter for follow-up examination after completed treatment for conditions other than malignant neoplasm: Secondary | ICD-10-CM

## 2021-03-13 DIAGNOSIS — Z419 Encounter for procedure for purposes other than remedying health state, unspecified: Secondary | ICD-10-CM | POA: Diagnosis not present

## 2021-03-13 NOTE — Telephone Encounter (Signed)
Pt calling; would like appt c SDJ; having pain in incision; thinks she may have pulled a stitch.  [phone] Pt del 7/6 via c/s.

## 2021-03-13 NOTE — Progress Notes (Signed)
   Postoperative Follow-up Patient presents post op from cesarean section   3.5 weeks  ago.  Subjective: She denies fever, chills, nausea, and vomiting. Eating a regular diet without difficulty. The patient is not having any pain.  Activity: increasing slowly. Over the weekend she found a string (suture) poking through the incision and she pulled it. She felt a popping sensation and has had pain since then. Denies bleeding or drainage from the incision.    Objective: BP 130/72   Ht 5\' 2"  (1.575 m)   Wt 219 lb 6.4 oz (99.5 kg)   Breastfeeding Yes   BMI 40.13 kg/m   Physical Exam Constitutional:      General: She is not in acute distress.    Appearance: Normal appearance.  HENT:     Head: Normocephalic and atraumatic.  Eyes:     General: No scleral icterus.    Conjunctiva/sclera: Conjunctivae normal.  Abdominal:     General: There is no distension.     Palpations: Abdomen is soft. There is mass (uterine fundus at U-5).     Tenderness: There is no abdominal tenderness. There is no guarding or rebound.     Comments: Incision: without erythema, induration, warmth, and tenderness. It is clean, dry, and intact.  (The incision is mildly tender in the middle)    Neurological:     General: No focal deficit present.     Mental Status: She is alert and oriented to person, place, and time.     Cranial Nerves: No cranial nerve deficit.  Psychiatric:        Mood and Affect: Mood normal.        Behavior: Behavior normal.        Judgment: Judgment normal.    Assessment: 30 y.o. s/p cesarean section progressing well Patient reassured regarding her incision.   Plan: Patient has done well after surgery with no apparent complications.  I have discussed the post-operative course to date, and the expected progress moving forward.  The patient understands what complications to be concerned about.    Activity plan: increase slowly  Return in 3 weeks (on 04/03/2021) for Six Week  Postpartum.  04/05/2021, MD 03/13/2021 4:19 PM

## 2021-04-03 ENCOUNTER — Ambulatory Visit: Payer: Medicaid Other | Admitting: Obstetrics and Gynecology

## 2021-04-13 DIAGNOSIS — Z419 Encounter for procedure for purposes other than remedying health state, unspecified: Secondary | ICD-10-CM | POA: Diagnosis not present

## 2021-04-25 ENCOUNTER — Ambulatory Visit: Payer: Medicaid Other | Admitting: Obstetrics and Gynecology

## 2021-05-01 ENCOUNTER — Encounter: Payer: Self-pay | Admitting: Obstetrics and Gynecology

## 2021-05-01 ENCOUNTER — Other Ambulatory Visit: Payer: Self-pay

## 2021-05-01 ENCOUNTER — Ambulatory Visit (INDEPENDENT_AMBULATORY_CARE_PROVIDER_SITE_OTHER): Payer: Medicaid Other | Admitting: Obstetrics and Gynecology

## 2021-05-01 DIAGNOSIS — Z30017 Encounter for initial prescription of implantable subdermal contraceptive: Secondary | ICD-10-CM

## 2021-05-01 DIAGNOSIS — N912 Amenorrhea, unspecified: Secondary | ICD-10-CM

## 2021-05-01 LAB — POCT URINE PREGNANCY: Preg Test, Ur: NEGATIVE

## 2021-05-01 MED ORDER — NEXPLANON 68 MG ~~LOC~~ IMPL: 1.0000 | DRUG_IMPLANT | Freq: Once | SUBCUTANEOUS | 0 refills | Status: DC

## 2021-05-01 NOTE — Progress Notes (Signed)
Postpartum Visit  Chief Complaint:  Chief Complaint  Patient presents with   Postpartum Care    History of Present Illness: Patient is a 30 y.o. [id number] presents for postpartum visit. Date of delivery: 02/15/2021 Type of delivery: C-Section Episiotomy No.  Laceration: no  Pregnancy or labor problems:  yes-hypertension Any problems since the delivery:  no  Newborn Details:  SINGLETON :  1. Baby's name: Anissa. Birth weight: 5.6lb Maternal Details:  Breast Feeding:  yes (and bottle feeding) Post partum depression/anxiety noted:  no Edinburgh Post-Partum Depression Score:  2  Date of last PAP: 09/02/2020  normal   Past Medical History:  Diagnosis Date   Acanthosis nigricans    Allergy    Anxiety    Depression    Dysmenorrhea    GERD (gastroesophageal reflux disease)    History of suicide attempt    Migraine without status migrainosus, not intractable    Obesity     Past Surgical History:  Procedure Laterality Date   BREAST LUMPECTOMY Left 2008   CESAREAN SECTION     CESAREAN SECTION  02/15/2021   Procedure: CESAREAN SECTION;  Surgeon: Conard Novak, MD;  Location: [facility] ORS;  Service: Obstetrics;;    Prior to Admission medications   Medication Sig Start Date End Date Taking? Authorizing Provider  ibuprofen (ADVIL) 600 MG tablet Take 1 tablet (600 mg total) by mouth every 6 (six) hours. 02/18/21   Mirna Mires, CNM  NIFEdipine (ADALAT CC) 60 MG 24 hr tablet Take 1 tablet (60 mg total) by mouth daily. 02/19/21   Mirna Mires, CNM  oxyCODONE-acetaminophen (PERCOCET/ROXICET) 5-325 MG tablet Take 1 tablet by mouth every 4 (four) hours as needed for moderate pain (pain score 4-7/10). 02/18/21   Mirna Mires, CNM  Prenatal Vit-Fe Fumarate-FA (PRENATAL MULTIVITAMIN) TABS tablet Take 1 tablet by mouth daily at 12 noon.    [provider]    No Known Allergies   Social History   Socioeconomic History   Marital status: Single    Spouse name: Not on  file   Number of children: 1   Years of education: Not on file   Highest education level: High school graduate  Occupational History   Occupation: Case Production designer, theatre/television/film    Comment: [employer]   Occupation: Conservation officer, nature    Comment: Scientist, forensic [employer]  Tobacco Use   Smoking status: Former    Years: 3.00    Types: Cigarettes    Start date: 04/10/2012   Smokeless tobacco: Never   Tobacco comments:    1-2 black and milds a day  Vaping Use   Vaping Use: Former  Substance and Sexual Activity   Alcohol use: No    Alcohol/week: 0.0 standard drinks   Drug use: Yes    Types: Marijuana    Comment: 1-2x day   Sexual activity: Yes    Partners: Male    Birth control/protection: None  Other Topics Concern   Not on file  Social History Narrative   Not on file   Social Determinants of Health   Financial Resource Strain: Not on file  Food Insecurity: Not on file  Transportation Needs: Not on file  Physical Activity: Not on file  Stress: Not on file  Social Connections: Not on file  Intimate Partner Violence: Not on file    Family History  Problem Relation Age of Onset   Migraines Mother    Stroke Father    Diabetes Father    Obesity Father  Hypertension Father    Asthma Brother    Asthma Brother    Asthma Brother    Diabetes Maternal Grandmother    Hypertension Maternal Grandmother     Review of Systems  Constitutional: Negative.   HENT: Negative.    Eyes: Negative.   Respiratory: Negative.    Cardiovascular: Negative.   Gastrointestinal: Negative.   Genitourinary: Negative.   Musculoskeletal: Negative.   Skin: Negative.   Neurological: Negative.   Psychiatric/Behavioral: Negative.      Physical Exam BP 126/84   Ht 5\' 2"  (1.575 m)   Wt 219 lb (99.3 kg)   Breastfeeding Yes   BMI 40.06 kg/m   Physical Exam Constitutional:      General: She is not in acute distress.    Appearance: Normal appearance. She is well-developed.  HENT:     Head: Normocephalic  and atraumatic.  Eyes:     General: No scleral icterus.    Conjunctiva/sclera: Conjunctivae normal.  Cardiovascular:     Rate and Rhythm: Normal rate and regular rhythm.     Heart sounds: No murmur heard.   No friction rub. No gallop.  Pulmonary:     Effort: Pulmonary effort is normal. No respiratory distress.     Breath sounds: Normal breath sounds. No wheezing or rales.  Abdominal:     General: Bowel sounds are normal. There is no distension.     Palpations: Abdomen is soft. There is no mass.     Tenderness: There is no abdominal tenderness. There is no guarding or rebound.  Musculoskeletal:        General: Normal range of motion.     Cervical back: Normal range of motion and neck supple.  Neurological:     General: No focal deficit present.     Mental Status: She is alert and oriented to person, place, and time.     Cranial Nerves: No cranial nerve deficit.  Skin:    General: Skin is warm and dry.     Findings: No erythema.  Psychiatric:        Mood and Affect: Mood normal.        Behavior: Behavior normal.        Judgment: Judgment normal.      GYNECOLOGY PROCEDURE NOTE  Patient is a 30 y.o. 37 presenting for Nexplanon insertion as her desires means of contraception.  She provided informed consent, signed copy in the chart, time out was performed. Pregnancy test was negative, with self reported LMP of No LMP recorded.  She understands that Nexplanon is a progesterone only therapy, and that patients often patients have irregular and unpredictable vaginal bleeding or amenorrhea. She understands that other side effects are possible related to systemic progesterone, including but not limited to, headaches, breast tenderness, nausea, and irritability. While effective at preventing pregnancy long acting reversible contraceptives do not prevent transmission of sexually transmitted diseases and use of barrier methods for this purpose was discussed. The placement procedure for  Nexplanon was reviewed with the patient in detail including risks of nerve injury, infection, bleeding and injury to other muscles or tendons. She understands that the Nexplanon implant is good for 3 years and needs to be removed at the end of that time.  She understands that Nexplanon is an extremely effective option for contraception, with failure rate of <1%. This information is reviewed today and all questions were answered. Informed consent was obtained, both verbally and written.   The patient is healthy and has no contraindications  to Nexplanon use. Urine pregnancy test was performed today and was negative.  Procedure Appropriate time out taken.  Patient placed in dorsal supine with left arm above head, elbow flexed at 90 degrees, arm resting on examination table with hand behind her head.  The bicipital grove was palpated and site 8-10cm proximal to the medial epicondyle was indentified.  Per the manufacturer's recommendations, the insertion site was marked along a line 3-5 cm posterior (toward the triceps) to the bicipital groove and at 8-10 cm medial to the medial epicondyle. The insertion site was prepped with a two betadine swabs and then injected with 3 cc of 1% lidocaine without epinephrine.  Nexplanon removed form sterile blister packaging,  Device confirmed in needle, before inserting full length of needle, tenting up the skin as the needle was advance.  The drug eluting rod was then deployed by pulling back the slider per the manufactures recommendation.  The implant was palpable by the clinician as well as the patient.  The insertion site covered dressed with a 1/2" steri-strip before applying  a kerlex bandage pressure dressing..Minimal blood loss was noted during the procedure.  The patient tolerated the procedure well.   She was instructed to wear the bandage for 24 hours, call with any signs of infection.  She was given the Nexplanon card and instructed to have the rod removed in 3  years.  Female Chaperone present during breast and/or pelvic exam.  Assessment: 30 y.o. G2P2002 presenting for 6 week postpartum visit  Plan: Problem List Items Addressed This Visit       Other   Postpartum care following cesarean delivery - Primary   Relevant Medications   etonogestrel (NEXPLANON) 68 MG IMPL implant   Other Visit Diagnoses     Insertion of Nexplanon       Relevant Medications   etonogestrel (NEXPLANON) 68 MG IMPL implant   Amenorrhea       Relevant Orders   POCT urine pregnancy (Completed)        1) Contraception: Nexplanon placed today.  2)  Pap - ASCCP guidelines and rational discussed.  Patient opts for routine screening interval  3) Patient underwent screening for postpartum depression with no concerns noted.  4) Follow up 1 year for routine annual exam  Thomasene Mohair, MD 05/01/2021 3:44 PM

## 2021-05-13 DIAGNOSIS — Z419 Encounter for procedure for purposes other than remedying health state, unspecified: Secondary | ICD-10-CM | POA: Diagnosis not present

## 2021-06-13 DIAGNOSIS — Z419 Encounter for procedure for purposes other than remedying health state, unspecified: Secondary | ICD-10-CM | POA: Diagnosis not present

## 2021-06-19 ENCOUNTER — Other Ambulatory Visit: Payer: Self-pay

## 2021-07-13 DIAGNOSIS — Z419 Encounter for procedure for purposes other than remedying health state, unspecified: Secondary | ICD-10-CM | POA: Diagnosis not present

## 2021-08-13 DIAGNOSIS — Z419 Encounter for procedure for purposes other than remedying health state, unspecified: Secondary | ICD-10-CM | POA: Diagnosis not present

## 2021-09-13 DIAGNOSIS — Z419 Encounter for procedure for purposes other than remedying health state, unspecified: Secondary | ICD-10-CM | POA: Diagnosis not present

## 2021-10-11 DIAGNOSIS — Z419 Encounter for procedure for purposes other than remedying health state, unspecified: Secondary | ICD-10-CM | POA: Diagnosis not present

## 2021-11-11 DIAGNOSIS — Z419 Encounter for procedure for purposes other than remedying health state, unspecified: Secondary | ICD-10-CM | POA: Diagnosis not present

## 2021-11-17 DIAGNOSIS — F331 Major depressive disorder, recurrent, moderate: Secondary | ICD-10-CM | POA: Diagnosis not present

## 2021-12-11 DIAGNOSIS — Z419 Encounter for procedure for purposes other than remedying health state, unspecified: Secondary | ICD-10-CM | POA: Diagnosis not present

## 2022-01-11 DIAGNOSIS — Z419 Encounter for procedure for purposes other than remedying health state, unspecified: Secondary | ICD-10-CM | POA: Diagnosis not present

## 2022-02-10 DIAGNOSIS — Z419 Encounter for procedure for purposes other than remedying health state, unspecified: Secondary | ICD-10-CM | POA: Diagnosis not present

## 2022-02-26 ENCOUNTER — Ambulatory Visit: Payer: Self-pay | Admitting: *Deleted

## 2022-02-26 NOTE — Telephone Encounter (Signed)
  Chief Complaint: elevated BP Symptoms: elevated BP- dizziness, headache, lightheaded Frequency: started last week Pertinent Negatives: Patient denies blurred vision, chest pain, difficulty breathing, headache, weakness Disposition: [] ED /[] Urgent Care (no appt availability in office) / [x] Appointment(In office/virtual)/ []  [facility]/ [] Home Care/ [] Refused Recommended Disposition /[] [facility]/ []  Follow-up with PCP Additional Notes:      Reason for Disposition  Systolic BP  >= 160 OR Diastolic >= 100  Answer Assessment - Initial Assessment Questions 1. BLOOD PRESSURE: "What is the blood pressure?" "Did you take at least two measurements 5 minutes apart?"     147/101, 157/98 P 110 2. ONSET: "When did you take your blood pressure?"     Started last week- TH-164/104, 8am, 4:30pm 3. HOW: "How did you take your blood pressure?" (e.g., automatic home BP monitor, visiting nurse)     Automatic cuff- arm 4. HISTORY: "Do you have a history of high blood pressure?"     Yes- during pregnancy -1 year ago 5. MEDICINES: "Are you taking any medicines for blood pressure?" "Have you missed any doses recently?"    Adalat CT 6. OTHER SYMPTOMS: "Do you have any symptoms?" (e.g., blurred vision, chest pain, difficulty breathing, headache, weakness)     Dizziness, lightheaded 7. PREGNANCY: "Is there any chance you are pregnant?" "When was your last menstrual period?"     no  Protocols used: Blood Pressure - High-A-AH

## 2022-02-27 ENCOUNTER — Ambulatory Visit (INDEPENDENT_AMBULATORY_CARE_PROVIDER_SITE_OTHER): Payer: Medicaid Other | Admitting: Nurse Practitioner

## 2022-02-27 ENCOUNTER — Other Ambulatory Visit: Payer: Self-pay

## 2022-02-27 ENCOUNTER — Encounter: Payer: Self-pay | Admitting: Nurse Practitioner

## 2022-02-27 VITALS — BP 138/96 | HR 98 | Temp 98.5°F | Resp 16 | Ht 62.0 in | Wt 210.1 lb

## 2022-02-27 DIAGNOSIS — F329 Major depressive disorder, single episode, unspecified: Secondary | ICD-10-CM

## 2022-02-27 DIAGNOSIS — F411 Generalized anxiety disorder: Secondary | ICD-10-CM | POA: Diagnosis not present

## 2022-02-27 DIAGNOSIS — Z1159 Encounter for screening for other viral diseases: Secondary | ICD-10-CM

## 2022-02-27 DIAGNOSIS — I10 Essential (primary) hypertension: Secondary | ICD-10-CM

## 2022-02-27 DIAGNOSIS — Z13220 Encounter for screening for lipoid disorders: Secondary | ICD-10-CM

## 2022-02-27 DIAGNOSIS — Z131 Encounter for screening for diabetes mellitus: Secondary | ICD-10-CM

## 2022-02-27 DIAGNOSIS — Z7689 Persons encountering health services in other specified circumstances: Secondary | ICD-10-CM

## 2022-02-27 DIAGNOSIS — Z6838 Body mass index (BMI) 38.0-38.9, adult: Secondary | ICD-10-CM

## 2022-02-27 MED ORDER — ESCITALOPRAM OXALATE 10 MG PO TABS: 10.0000 mg | ORAL_TABLET | Freq: Every day | ORAL | 0 refills | Status: DC

## 2022-02-27 MED ORDER — NIFEDIPINE ER 60 MG PO TB24: 60.0000 mg | ORAL_TABLET | Freq: Every day | ORAL | 2 refills | Status: DC

## 2022-02-27 MED ORDER — HYDROCHLOROTHIAZIDE 12.5 MG PO TABS: 12.5000 mg | ORAL_TABLET | Freq: Every day | ORAL | 0 refills | Status: DC

## 2022-02-27 NOTE — Assessment & Plan Note (Signed)
PHQ 9 and GAD scores are positive today.  Discussed options with patient.  Patient would like to start Lexapro 10 mg daily.  Follow-up in 1 month. 

## 2022-02-27 NOTE — Assessment & Plan Note (Signed)
PHQ 9 and GAD scores are positive today.  Discussed options with patient.  Patient would like to start Lexapro 10 mg daily.  Follow-up in 1 month.

## 2022-02-27 NOTE — Assessment & Plan Note (Signed)
Continue taking nifedipine 60 mg daily add on hydrochlorothiazide 12.5 mg daily.  Continue keep log of blood pressure.  Come back in 1 week for nurse visit to check blood pressure.

## 2022-02-27 NOTE — Progress Notes (Addendum)
BP (!) 138/96   Pulse 98   Temp 98.5 F (36.9 C) (Oral)   Resp 16   Ht 5\' 2"  (1.575 m)   Wt 210 lb 1.6 oz (95.3 kg)   LMP 02/27/2022   SpO2 98%   BMI 38.43 kg/m    Subjective:    Patient ID: 03/01/2022, female    DOB: 03/22/1991, 31 y.o.   MRN: 1991  HPI: Kristina Mccann is a 31 y.o. female  Chief Complaint  Patient presents with   Establish Care   Hypertension   Re-establish care: Was last seen in 2019 by 2020, NP.  Patient has a history of hypertension, obesity and depression. She also had a baby about a year ago and had preeclampsia.   HTN: She says she was put on nifedipine 60 mg daily when she was pregnant with her second child.  She says that after delivery she was doing better so she stopped taking it.  She says she has been off it for about 8 months.  She says recently her blood pressures have been running 165/104, 147/101. She says she restarted taking her nifedipine 60 mg daily five days ago.  She says even with restarting the medication she has continued to have elevated readings. She says she has had blurred vision, pressure behind her eyes, tinnitus and headaches. Her blood pressure today was 138/96 retake was 138/96.  Continue with nifedipine 60 mg daily we will add on hydrochlorothiazide 12.5 mg daily.  Patient is to come back in 1 week for blood pressure check.  Obesity: She says she would like to look into bariatric surgery.  Patient's current weight is 210 pounds with a BMI of 38.43 she also has comorbidities.  Placed referral for bariatric surgeon to  discuss options  Depression/aniexty: Her PHQ 9 and GAD score is positive today. She says that she has been struggling with anxiety and depression for a long time. Patient says that she has tried counseling before and that did not help. She says she has tried Cymbalta, lexapro and Prozac.  Discussed options with patient patient would like to retry Lexapro.  Scription sent patient will follow-up in 1  month    02/27/2022    3:32 PM 01/31/2021    8:14 AM 04/04/2018   10:56 AM 03/20/2016   10:13 AM 04/11/2015   12:16 PM  Depression screen PHQ 2/9  Decreased Interest 1 0 0 2 0  Down, Depressed, Hopeless 1 0 0 3 0  PHQ - 2 Score 2 0 0 5 0  Altered sleeping 2  0 3   Tired, decreased energy 2  0 3   Change in appetite 1  0 2   Feeling bad or failure about yourself  1  0 2   Trouble concentrating 2  0 3   Moving slowly or fidgety/restless 1  0 2   Suicidal thoughts 0  0 1   PHQ-9 Score 11  0 21   Difficult doing work/chores Somewhat difficult  Not difficult at all Extremely dIfficult        02/27/2022    3:32 PM 03/20/2016   10:41 AM  GAD 7 : Generalized Anxiety Score  Nervous, Anxious, on Edge 2 2  Control/stop worrying 2 3  Worry too much - different things 2 3  Trouble relaxing 3 2  Restless 3 3  Easily annoyed or irritable 3 3  Afraid - awful might happen 2 3  Total GAD 7  Score 17 19  Anxiety Difficulty Somewhat difficult Extremely difficult     Relevant past medical, surgical, family and social history reviewed and updated as indicated. Interim medical history since our last visit reviewed. Allergies and medications reviewed and updated.  Review of Systems  Constitutional: Negative for fever or weight change.  Respiratory: Negative for cough and shortness of breath.   Cardiovascular: Negative for chest pain or palpitations.  Gastrointestinal: Negative for abdominal pain, no bowel changes.  Musculoskeletal: Negative for gait problem or joint swelling.  Skin: Negative for rash.  Neurological: Negative for dizziness, positive headache.  No other specific complaints in a complete review of systems (except as listed in HPI above).      Objective:    BP (!) 138/96   Pulse 98   Temp 98.5 F (36.9 C) (Oral)   Resp 16   Ht 5\' 2"  (1.575 m)   Wt 210 lb 1.6 oz (95.3 kg)   LMP 02/27/2022   SpO2 98%   BMI 38.43 kg/m   Wt Readings from Last 3 Encounters:  02/27/22 210 lb  1.6 oz (95.3 kg)  05/01/21 219 lb (99.3 kg)  03/13/21 219 lb 6.4 oz (99.5 kg)    Physical Exam  Constitutional: Patient appears well-developed and well-nourished. Obese  No distress.  HEENT: head atraumatic, normocephalic, pupils equal and reactive to light, neck supple Cardiovascular: Normal rate, regular rhythm and normal heart sounds.  No murmur heard. No BLE edema. Pulmonary/Chest: Effort normal and breath sounds normal. No respiratory distress. Abdominal: Soft.  There is no tenderness. Psychiatric: Patient has a normal mood and affect. behavior is normal. Judgment and thought content normal.  Results for orders placed or performed in visit on 05/01/21  POCT urine pregnancy  Result Value Ref Range   Preg Test, Ur Negative Negative      Assessment & Plan:   Problem List Items Addressed This Visit       Cardiovascular and Mediastinum   Essential hypertension    Continue taking nifedipine 60 mg daily add on hydrochlorothiazide 12.5 mg daily.  Continue keep log of blood pressure.  Come back in 1 week for nurse visit to check blood pressure.      Relevant Medications   hydrochlorothiazide (HYDRODIURIL) 12.5 MG tablet   NIFEdipine (ADALAT CC) 60 MG 24 hr tablet   Other Relevant Orders   CBC with Differential/Platelet   COMPLETE METABOLIC PANEL WITH GFR     Other   Major depression, chronic (HCC)    PHQ 9 and GAD scores are positive today.  Discussed options with patient.  Patient would like to start Lexapro 10 mg daily.  Follow-up in 1 month.      Relevant Medications   escitalopram (LEXAPRO) 10 MG tablet   Class 2 severe obesity due to excess calories with serious comorbidity and body mass index (BMI) of 38.0 to 38.9 in adult [facility]) - Primary   Relevant Orders   Amb Referral to Bariatric Surgery   CBC with Differential/Platelet   COMPLETE METABOLIC PANEL WITH GFR   Hemoglobin A1c   Lipid panel   GAD (generalized anxiety disorder)    PHQ 9 and GAD scores are positive  today.  Discussed options with patient.  Patient would like to start Lexapro 10 mg daily.  Follow-up in 1 month.      Relevant Medications   escitalopram (LEXAPRO) 10 MG tablet   Other Visit Diagnoses     Screening for diabetes mellitus  Relevant Orders   COMPLETE METABOLIC PANEL WITH GFR   Hemoglobin A1c   Screening for cholesterol level       Relevant Orders   Lipid panel   Encounter for hepatitis C screening test for low risk patient       Relevant Orders   Hepatitis C antibody   Encounter to establish care       Patient need to reestablish care.  Patient is overdue for physical.        Follow up plan: Return in about 4 weeks (around 03/27/2022) for follow up.

## 2022-02-28 LAB — LIPID PANEL
Cholesterol: 180 mg/dL (ref <200)
HDL: 46 mg/dL — ABNORMAL LOW (ref >=50)
LDL Cholesterol (Calc): 107 mg/dL (calc) — ABNORMAL HIGH
Non-HDL Cholesterol (Calc): 134 mg/dL (calc) — ABNORMAL HIGH (ref <130)
Total CHOL/HDL Ratio: 3.9 (calc) (ref <5.0)
Triglycerides: 156 mg/dL — ABNORMAL HIGH (ref <150)

## 2022-02-28 LAB — CBC WITH DIFFERENTIAL/PLATELET
Absolute Monocytes: 523 cells/uL (ref 200–950)
Basophils Absolute: 40 cells/uL (ref 0–200)
Basophils Relative: 0.6 %
Eosinophils Absolute: 127 cells/uL (ref 15–500)
Eosinophils Relative: 1.9 %
HCT: 39.7 % (ref 35.0–45.0)
Hemoglobin: 13.2 g/dL (ref 11.7–15.5)
Lymphs Abs: 2687 cells/uL (ref 850–3900)
MCH: 28.3 pg (ref 27.0–33.0)
MCHC: 33.2 g/dL (ref 32.0–36.0)
MCV: 85.2 fL (ref 80.0–100.0)
MPV: 10.7 fL (ref 7.5–12.5)
Monocytes Relative: 7.8 %
Neutro Abs: 3323 cells/uL (ref 1500–7800)
Neutrophils Relative %: 49.6 %
Platelets: 397 10*3/uL (ref 140–400)
RBC: 4.66 10*6/uL (ref 3.80–5.10)
RDW: 13.7 % (ref 11.0–15.0)
Total Lymphocyte: 40.1 %
WBC: 6.7 10*3/uL (ref 3.8–10.8)

## 2022-02-28 LAB — COMPLETE METABOLIC PANEL WITH GFR
AG Ratio: 1.3 (calc) (ref 1.0–2.5)
ALT: 13 U/L (ref 6–29)
AST: 18 U/L (ref 10–30)
Albumin: 4.4 g/dL (ref 3.6–5.1)
Alkaline phosphatase (APISO): 113 U/L (ref 31–125)
BUN: 11 mg/dL (ref 7–25)
CO2: 28 mmol/L (ref 20–32)
Calcium: 9.6 mg/dL (ref 8.6–10.2)
Chloride: 104 mmol/L (ref 98–110)
Creat: 0.76 mg/dL (ref 0.50–0.97)
Globulin: 3.3 g/dL (calc) (ref 1.9–3.7)
Glucose, Bld: 73 mg/dL (ref 65–99)
Potassium: 4.4 mmol/L (ref 3.5–5.3)
Sodium: 141 mmol/L (ref 135–146)
Total Bilirubin: 0.3 mg/dL (ref 0.2–1.2)
Total Protein: 7.7 g/dL (ref 6.1–8.1)
eGFR: 108 mL/min/{1.73_m2} (ref >=60)

## 2022-02-28 LAB — HEMOGLOBIN A1C
Hgb A1c MFr Bld: 5.3 % of total Hgb (ref <5.7)
Mean Plasma Glucose: 105 mg/dL
eAG (mmol/L): 5.8 mmol/L

## 2022-02-28 LAB — HEPATITIS C ANTIBODY: Hepatitis C Ab: NONREACTIVE

## 2022-03-13 DIAGNOSIS — Z419 Encounter for procedure for purposes other than remedying health state, unspecified: Secondary | ICD-10-CM | POA: Diagnosis not present

## 2022-03-30 ENCOUNTER — Ambulatory Visit: Payer: Medicaid Other | Admitting: Nurse Practitioner

## 2022-03-30 NOTE — Progress Notes (Deleted)
There were no vitals taken for this visit.   Subjective:    Patient ID: Kristina Mccann, female    DOB: 1990-12-10, 31 y.o.   MRN: [id number]  HPI: Kristina Mccann is a 31 y.o. female  No chief complaint on file.  Depression/anxiety:  Patient reports that she has struggled with anxiety and depression for a long time. She has tried counseling before but it did not help.  She has tried Cymbalta, lexapro and Prozac in the past.  She restarted lexapro 10 mg at last appointment.  Today she reports ***.  Relevant past medical, surgical, family and social history reviewed and updated as indicated. Interim medical history since our last visit reviewed. Allergies and medications reviewed and updated.  Review of Systems  Per HPI unless specifically indicated above     Objective:    There were no vitals taken for this visit.  Wt Readings from Last 3 Encounters:  02/27/22 210 lb 1.6 oz (95.3 kg)  05/01/21 219 lb (99.3 kg)  03/13/21 219 lb 6.4 oz (99.5 kg)    Physical Exam  Results for orders placed or performed in visit on 02/27/22  CBC with Differential/Platelet  Result Value Ref Range   WBC 6.7 3.8 - 10.8 Thousand/uL   RBC 4.66 3.80 - 5.10 Million/uL   Hemoglobin 13.2 11.7 - 15.5 g/dL   HCT 39.7 35.0 - 45.0 %   MCV 85.2 80.0 - 100.0 fL   MCH 28.3 27.0 - 33.0 pg   MCHC 33.2 32.0 - 36.0 g/dL   RDW 13.7 11.0 - 15.0 %   Platelets 397 140 - 400 Thousand/uL   MPV 10.7 7.5 - 12.5 fL   Neutro Abs 3,323 1,500 - 7,800 cells/uL   Lymphs Abs 2,687 850 - 3,900 cells/uL   Absolute Monocytes 523 200 - 950 cells/uL   Eosinophils Absolute 127 15 - 500 cells/uL   Basophils Absolute 40 0 - 200 cells/uL   Neutrophils Relative % 49.6 %   Total Lymphocyte 40.1 %   Monocytes Relative 7.8 %   Eosinophils Relative 1.9 %   Basophils Relative 0.6 %  COMPLETE METABOLIC PANEL WITH GFR  Result Value Ref Range   Glucose, Bld 73 65 - 99 mg/dL   BUN 11 7 - 25 mg/dL   Creat 0.76 0.50 - 0.97 mg/dL    eGFR 108 > OR = 60 mL/min/1.52m   BUN/Creatinine Ratio NOT APPLICABLE 6 - 22 (calc)   Sodium 141 135 - 146 mmol/L   Potassium 4.4 3.5 - 5.3 mmol/L   Chloride 104 98 - 110 mmol/L   CO2 28 20 - 32 mmol/L   Calcium 9.6 8.6 - 10.2 mg/dL   Total Protein 7.7 6.1 - 8.1 g/dL   Albumin 4.4 3.6 - 5.1 g/dL   Globulin 3.3 1.9 - 3.7 g/dL (calc)   AG Ratio 1.3 1.0 - 2.5 (calc)   Total Bilirubin 0.3 0.2 - 1.2 mg/dL   Alkaline phosphatase (APISO) 113 31 - 125 U/L   AST 18 10 - 30 U/L   ALT 13 6 - 29 U/L  Hemoglobin A1c  Result Value Ref Range   Hgb A1c MFr Bld 5.3 <5.7 % of total Hgb   Mean Plasma Glucose 105 mg/dL   eAG (mmol/L) 5.8 mmol/L  Lipid panel  Result Value Ref Range   Cholesterol 180 <200 mg/dL   HDL 46 (L) > OR = 50 mg/dL   Triglycerides 156 (H) <150 mg/dL   LDL Cholesterol (Calc)  107 (H) mg/dL (calc)   Total CHOL/HDL Ratio 3.9 <5.0 (calc)   Non-HDL Cholesterol (Calc) 134 (H) <130 mg/dL (calc)  Hepatitis C antibody  Result Value Ref Range   Hepatitis C Ab NON-REACTIVE NON-REACTIVE      Assessment & Plan:   Problem List Items Addressed This Visit   None    Follow up plan: No follow-ups on file.

## 2022-04-05 DIAGNOSIS — R0681 Apnea, not elsewhere classified: Secondary | ICD-10-CM | POA: Diagnosis not present

## 2022-04-05 DIAGNOSIS — E785 Hyperlipidemia, unspecified: Secondary | ICD-10-CM | POA: Insufficient documentation

## 2022-04-05 DIAGNOSIS — R0683 Snoring: Secondary | ICD-10-CM | POA: Diagnosis not present

## 2022-04-05 DIAGNOSIS — Z7689 Persons encountering health services in other specified circumstances: Secondary | ICD-10-CM | POA: Diagnosis not present

## 2022-04-13 DIAGNOSIS — Z419 Encounter for procedure for purposes other than remedying health state, unspecified: Secondary | ICD-10-CM | POA: Diagnosis not present

## 2022-05-13 DIAGNOSIS — Z419 Encounter for procedure for purposes other than remedying health state, unspecified: Secondary | ICD-10-CM | POA: Diagnosis not present

## 2022-05-17 NOTE — Progress Notes (Signed)
Pt arrived from [facility] for NST. Due to difficulty monitoring pt, NST will take a longer time. See next shift nurses note for completion of test.

## 2022-05-28 ENCOUNTER — Ambulatory Visit: Payer: Medicaid Other | Admitting: Internal Medicine

## 2022-05-28 NOTE — Progress Notes (Signed)
No show for appointment. Office will call to reschedule.  

## 2022-05-28 NOTE — Progress Notes (Signed)
Pt canceled her appointment. She was rescheduled in [place].

## 2022-05-29 ENCOUNTER — Ambulatory Visit: Payer: Medicaid Other | Admitting: Internal Medicine

## 2022-05-29 DIAGNOSIS — Z91199 Patient's noncompliance with other medical treatment and regimen due to unspecified reason: Secondary | ICD-10-CM

## 2022-06-13 DIAGNOSIS — Z419 Encounter for procedure for purposes other than remedying health state, unspecified: Secondary | ICD-10-CM | POA: Diagnosis not present

## 2022-07-13 DIAGNOSIS — Z419 Encounter for procedure for purposes other than remedying health state, unspecified: Secondary | ICD-10-CM | POA: Diagnosis not present

## 2022-08-13 DIAGNOSIS — Z419 Encounter for procedure for purposes other than remedying health state, unspecified: Secondary | ICD-10-CM | POA: Diagnosis not present

## 2022-08-31 NOTE — Progress Notes (Unsigned)
Pt did not show for scheduled appointment

## 2022-09-03 ENCOUNTER — Ambulatory Visit: Payer: Medicaid Other

## 2022-09-13 DIAGNOSIS — Z419 Encounter for procedure for purposes other than remedying health state, unspecified: Secondary | ICD-10-CM | POA: Diagnosis not present

## 2022-09-18 ENCOUNTER — Ambulatory Visit (INDEPENDENT_AMBULATORY_CARE_PROVIDER_SITE_OTHER): Payer: Medicaid Other | Admitting: Nurse Practitioner

## 2022-09-18 ENCOUNTER — Other Ambulatory Visit (HOSPITAL_COMMUNITY)
Admission: RE | Admit: 2022-09-18 | Discharge: 2022-09-18 | Disposition: A | Discharge status: Home / Self Care | Payer: Medicaid Other | Source: Ambulatory Visit | Attending: Nurse Practitioner | Admitting: Nurse Practitioner

## 2022-09-18 ENCOUNTER — Encounter: Payer: Self-pay | Admitting: Nurse Practitioner

## 2022-09-18 VITALS — BP 124/68 | HR 107 | Temp 98.2°F | Resp 18 | Ht 62.0 in | Wt 222.0 lb

## 2022-09-18 DIAGNOSIS — I10 Essential (primary) hypertension: Secondary | ICD-10-CM | POA: Diagnosis not present

## 2022-09-18 DIAGNOSIS — F331 Major depressive disorder, recurrent, moderate: Secondary | ICD-10-CM

## 2022-09-18 DIAGNOSIS — Z113 Encounter for screening for infections with a predominantly sexual mode of transmission: Secondary | ICD-10-CM

## 2022-09-18 DIAGNOSIS — Z6841 Body Mass Index (BMI) 40.0 and over, adult: Secondary | ICD-10-CM | POA: Diagnosis not present

## 2022-09-18 DIAGNOSIS — F411 Generalized anxiety disorder: Secondary | ICD-10-CM

## 2022-09-18 LAB — POCT URINE PREGNANCY: Preg Test, Ur: NEGATIVE

## 2022-09-18 MED ORDER — ZEPBOUND 2.5 MG/0.5ML ~~LOC~~ SOAJ: 2.5000 mg | SUBCUTANEOUS | 0 refills | Status: DC

## 2022-09-18 MED ORDER — SERTRALINE HCL 25 MG PO TABS: 25.0000 mg | ORAL_TABLET | Freq: Every day | ORAL | 0 refills | Status: DC

## 2022-09-18 NOTE — Assessment & Plan Note (Signed)
Patient wanted to see if she could be approved for zepbound, prescription sent in.

## 2022-09-18 NOTE — Assessment & Plan Note (Signed)
Start zoloft 25 mg daily, follow up in 6 weeks, consider going back to therapy 

## 2022-09-18 NOTE — Assessment & Plan Note (Signed)
Blood pressure at goal, not on medications

## 2022-09-18 NOTE — Assessment & Plan Note (Signed)
Start zoloft 25 mg daily, follow up in 6 weeks, consider going back to therapy

## 2022-09-18 NOTE — Progress Notes (Signed)
BP 124/68   Pulse (!) 107   Temp 98.2 F (36.8 C)   Resp 18   Ht 5\' 2"  (1.575 m)   Wt 222 lb (100.7 kg)   SpO2 97%   BMI 40.60 kg/m    Subjective:    Patient ID: Kristina Mccann, female    DOB: 05-06-1991, 32 y.o.   MRN: [id number]  HPI: Kristina Mccann is a 32 y.o. female  Chief Complaint  Patient presents with   std check   Anxiety    Just found out her boyfriend of has been cheating for last 2years.  Nausea,vomiting, loss appetite   Obesity    HTN:her blood pressure today is 124/68. She is not currently taking any medications.    She denies any chest pain, shortness of breath, headaches or blurred vision.     Obesity: her weight today is 222 lbs with a BMI of 40.60,.  we previously referred her to bariatric surgery and she had her appointment and decided that was not the right course for her.  She would like to try weight loss medication.    STD screening: patient reports that she just found out that her boyfriend had been cheating on her for the last two years.  Will do std screening.    Depression/aniexty: patient reports her anxiety has gotten worse due to her boyfriend cheating on her, she lost her job.  Her pHQ9 and GAD scores are high.  She has done counseling in the past. She says it is hard for her to stay committed to it so she has not had results with it.  She has previously tried Cymbalta, lexapro and prozac.  She says that she did retry the lexapro at last visit but it made her nauseated.   Will try zoloft and see how she does. Follow up in 6 weeks.       09/18/2022    1:21 PM 02/27/2022    3:32 PM 01/31/2021    8:14 AM 04/04/2018   10:56 AM 03/20/2016   10:13 AM  Depression screen PHQ 2/9  Decreased Interest 3 1 0 0 2  Down, Depressed, Hopeless 3 1 0 0 3  PHQ - 2 Score 6 2 0 0 5  Altered sleeping 3 2  0 3  Tired, decreased energy 2 2  0 3  Change in appetite 3 1  0 2  Feeling bad or failure about yourself  2 1  0 2  Trouble concentrating 3 2  0 3   Moving slowly or fidgety/restless 3 1  0 2  Suicidal thoughts 0 0  0 1  PHQ-9 Score 22 11  0 21  Difficult doing work/chores Extremely dIfficult Somewhat difficult  Not difficult at all Extremely dIfficult       09/18/2022    1:21 PM 02/27/2022    3:32 PM 03/20/2016   10:41 AM  GAD 7 : Generalized Anxiety Score  Nervous, Anxious, on Edge 3 2 2   Control/stop worrying 2 2 3   Worry too much - different things 3 2 3   Trouble relaxing 1 3 2   Restless 2 3 3   Easily annoyed or irritable 3 3 3   Afraid - awful might happen 1 2 3   Total GAD 7 Score 15 17 19   Anxiety Difficulty Extremely difficult Somewhat difficult Extremely difficult     Relevant past medical, surgical, family and social history reviewed and updated as indicated. Interim medical history since our last visit reviewed. Allergies  and medications reviewed and updated.  Review of Systems  Constitutional: Negative for fever or weight change.  Respiratory: Negative for cough and shortness of breath.   Cardiovascular: Negative for chest pain or palpitations.  Gastrointestinal: Negative for abdominal pain, no bowel changes.  Musculoskeletal: Negative for gait problem or joint swelling.  Skin: Negative for rash.  Neurological: Negative for dizziness, positive headache.  No other specific complaints in a complete review of systems (except as listed in HPI above).      Objective:    BP 124/68   Pulse (!) 107   Temp 98.2 F (36.8 C)   Resp 18   Ht 5\' 2"  (1.575 m)   Wt 222 lb (100.7 kg)   SpO2 97%   BMI 40.60 kg/m   Wt Readings from Last 3 Encounters:  09/18/22 222 lb (100.7 kg)  02/27/22 210 lb 1.6 oz (95.3 kg)  05/01/21 219 lb (99.3 kg)    Physical Exam  Constitutional: Patient appears well-developed and well-nourished. Obese  No distress.  HEENT: head atraumatic, normocephalic, pupils equal and reactive to light, neck supple Cardiovascular: Normal rate, regular rhythm and normal heart sounds.  No murmur heard. No  BLE edema. Pulmonary/Chest: Effort normal and breath sounds normal. No respiratory distress. Abdominal: Soft.  There is no tenderness. Psychiatric: Patient has a anxious and depressed mood and affect. behavior is normal. Judgment and thought content normal.  Results for orders placed or performed in visit on 09/18/22  POCT urine pregnancy  Result Value Ref Range   Preg Test, Ur Negative Negative      Assessment & Plan:   Problem List Items Addressed This Visit       Cardiovascular and Mediastinum   Essential hypertension    Blood pressure at goal, not on medications        Other   Major depression, chronic (HCC)    Start zoloft 25 mg daily, follow up in 6 weeks, consider going back to therapy      Relevant Medications   sertraline (ZOLOFT) 25 MG tablet   Class 2 severe obesity due to excess calories with serious comorbidity and body mass index (BMI) of 38.0 to 38.9 in adult [facility]) - Primary    Patient wanted to see if she could be approved for zepbound, prescription sent in.       Relevant Medications   tirzepatide (ZEPBOUND) 2.5 MG/0.5ML Pen   GAD (generalized anxiety disorder)    Start zoloft 25 mg daily, follow up in 6 weeks, consider going back to therapy      Relevant Medications   sertraline (ZOLOFT) 25 MG tablet   Other Visit Diagnoses     Screening examination for STD (sexually transmitted disease)       will check for std   Relevant Orders   POCT urine pregnancy (Completed)   Cervicovaginal ancillary only   HIV Antibody (routine testing w rflx)   Hepatitis C antibody   RPR        Follow up plan: Return in about 6 weeks (around 10/30/2022) for follow up.

## 2022-09-19 ENCOUNTER — Ambulatory Visit: Payer: Medicaid Other | Admitting: Nurse Practitioner

## 2022-09-19 LAB — HIV ANTIBODY (ROUTINE TESTING W REFLEX): HIV 1&2 Ab, 4th Generation: NONREACTIVE

## 2022-09-19 LAB — HEPATITIS C ANTIBODY: Hepatitis C Ab: NONREACTIVE

## 2022-09-19 LAB — RPR: RPR Ser Ql: NONREACTIVE

## 2022-09-20 ENCOUNTER — Other Ambulatory Visit: Payer: Self-pay | Admitting: Nurse Practitioner

## 2022-09-20 DIAGNOSIS — B9689 Other specified bacterial agents as the cause of diseases classified elsewhere: Secondary | ICD-10-CM

## 2022-09-20 LAB — CERVICOVAGINAL ANCILLARY ONLY
Bacterial Vaginitis (gardnerella): POSITIVE — AB
Chlamydia: NEGATIVE
Comment: NEGATIVE
Comment: NEGATIVE
Comment: NEGATIVE
Comment: NORMAL
Neisseria Gonorrhea: NEGATIVE
Trichomonas: NEGATIVE

## 2022-09-20 MED ORDER — METRONIDAZOLE 500 MG PO TABS: 500.0000 mg | ORAL_TABLET | Freq: Two times a day (BID) | ORAL | 0 refills | Status: AC

## 2022-10-12 DIAGNOSIS — Z419 Encounter for procedure for purposes other than remedying health state, unspecified: Secondary | ICD-10-CM | POA: Diagnosis not present

## 2022-10-30 NOTE — Progress Notes (Deleted)
There were no vitals taken for this visit.   Subjective:    Patient ID: Kristina Mccann, female    DOB: 11-26-1990, 32 y.o.   MRN: [id number]  HPI: Kristina Mccann is a 32 y.o. female  No chief complaint on file.   Obesity: her weight last appointment was 222 lbs with a BMI of 40.60,.  we previously referred her to bariatric surgery and she had her appointment and decided that was not the right course for her.  She wanted to try and get approved for zepbound.  Prescription was sent in.  Patient reports ***. Her weight today is ***.  Depression/aniexty: patient reported her anxiety had gotten worse due to her boyfriend cheating on her, she lost her job at her last appointment.  We started her on zoloft. She had previously been on cymbalta, lexapro and prozac.  She is here for 6 week follow up.  Her PHQ9 and GAD scores ***.  Patient reports ***.      09/18/2022    1:21 PM 02/27/2022    3:32 PM 01/31/2021    8:14 AM 04/04/2018   10:56 AM 03/20/2016   10:13 AM  Depression screen PHQ 2/9  Decreased Interest 3 1 0 0 2  Down, Depressed, Hopeless 3 1 0 0 3  PHQ - 2 Score 6 2 0 0 5  Altered sleeping 3 2  0 3  Tired, decreased energy 2 2  0 3  Change in appetite 3 1  0 2  Feeling bad or failure about yourself  2 1  0 2  Trouble concentrating 3 2  0 3  Moving slowly or fidgety/restless 3 1  0 2  Suicidal thoughts 0 0  0 1  PHQ-9 Score 22 11  0 21  Difficult doing work/chores Extremely dIfficult Somewhat difficult  Not difficult at all Extremely dIfficult       09/18/2022    1:21 PM 02/27/2022    3:32 PM 03/20/2016   10:41 AM  GAD 7 : Generalized Anxiety Score  Nervous, Anxious, on Edge 3 2 2   Control/stop worrying 2 2 3   Worry too much - different things 3 2 3   Trouble relaxing 1 3 2   Restless 2 3 3   Easily annoyed or irritable 3 3 3   Afraid - awful might happen 1 2 3   Total GAD 7 Score 15 17 19   Anxiety Difficulty Extremely difficult Somewhat difficult Extremely difficult      Relevant past medical, surgical, family and social history reviewed and updated as indicated. Interim medical history since our last visit reviewed. Allergies and medications reviewed and updated.  Review of Systems  Constitutional: Negative for fever or weight change.  Respiratory: Negative for cough and shortness of breath.   Cardiovascular: Negative for chest pain or palpitations.  Gastrointestinal: Negative for abdominal pain, no bowel changes.  Musculoskeletal: Negative for gait problem or joint swelling.  Skin: Negative for rash.  Neurological: Negative for dizziness, positive headache.  No other specific complaints in a complete review of systems (except as listed in HPI above).      Objective:    There were no vitals taken for this visit.  Wt Readings from Last 3 Encounters:  09/18/22 222 lb (100.7 kg)  02/27/22 210 lb 1.6 oz (95.3 kg)  05/01/21 219 lb (99.3 kg)    Physical Exam  Constitutional: Patient appears well-developed and well-nourished. Obese  No distress.  HEENT: head atraumatic, normocephalic, pupils equal and reactive to  light, neck supple Cardiovascular: Normal rate, regular rhythm and normal heart sounds.  No murmur heard. No BLE edema. Pulmonary/Chest: Effort normal and breath sounds normal. No respiratory distress. Abdominal: Soft.  There is no tenderness. Psychiatric: Patient has a anxious and depressed mood and affect. behavior is normal. Judgment and thought content normal.  Results for orders placed or performed in visit on 09/18/22  HIV Antibody (routine testing w rflx)  Result Value Ref Range   HIV 1&2 Ab, 4th Generation NON-REACTIVE NON-REACTIVE  Hepatitis C antibody  Result Value Ref Range   Hepatitis C Ab NON-REACTIVE NON-REACTIVE  RPR  Result Value Ref Range   RPR Ser Ql NON-REACTIVE NON-REACTIVE  POCT urine pregnancy  Result Value Ref Range   Preg Test, Ur Negative Negative  Cervicovaginal ancillary only  Result Value Ref Range    Bacterial Vaginitis (gardnerella) Positive (A)    Trichomonas Negative    Chlamydia Negative    Neisseria Gonorrhea Negative    Comment      Normal Reference Range Bacterial Vaginosis - Negative   Comment Normal Reference Range Trichomonas - Negative    Comment Normal Reference Ranger Chlamydia - Negative    Comment      Normal Reference Range Neisseria Gonorrhea - Negative      Assessment & Plan:   Problem List Items Addressed This Visit   None    Follow up plan: No follow-ups on file.

## 2022-11-01 ENCOUNTER — Ambulatory Visit: Payer: Medicaid Other | Admitting: Nurse Practitioner

## 2022-11-12 DIAGNOSIS — Z419 Encounter for procedure for purposes other than remedying health state, unspecified: Secondary | ICD-10-CM | POA: Diagnosis not present

## 2022-11-22 ENCOUNTER — Other Ambulatory Visit: Payer: Self-pay | Admitting: Nurse Practitioner

## 2022-11-22 DIAGNOSIS — F411 Generalized anxiety disorder: Secondary | ICD-10-CM

## 2022-11-22 DIAGNOSIS — F331 Major depressive disorder, recurrent, moderate: Secondary | ICD-10-CM

## 2022-11-23 ENCOUNTER — Other Ambulatory Visit: Payer: Self-pay | Admitting: Nurse Practitioner

## 2022-11-23 MED ORDER — ZEPBOUND 5 MG/0.5ML ~~LOC~~ SOAJ: 5.0000 mg | SUBCUTANEOUS | 0 refills | Status: DC

## 2022-11-30 MED ORDER — SERTRALINE HCL 25 MG PO TABS: 25.0000 mg | ORAL_TABLET | Freq: Every day | ORAL | 0 refills | Status: DC

## 2022-12-07 ENCOUNTER — Ambulatory Visit: Payer: Medicaid Other | Admitting: Nurse Practitioner

## 2022-12-07 NOTE — Progress Notes (Deleted)
There were no vitals taken for this visit.   Subjective:    Patient ID: Kristina Mccann, female    DOB: [birth year], 31 y.o.   MRN: [id number]  HPI: Kristina Mccann is a 32 y.o. female  No chief complaint on file.   HTN: her blood pressure today is ***. She is not currently on medications.  She denies any shortness of breath, headaches, blurred vision, or chest pain.    Obesity: her weight at last appointment was  222 lbs with a BMI of 40.60, weight today is *** with a BMI of ***.  we previously referred her to bariatric surgery and she had her appointment and decided that was not the right course for her.  She wanted to try weight loss medication.  Sent in zepbound. Patient reports ***  Depression/aniexty: patient reports her anxiety has gotten worse due to her boyfriend cheating on her, she lost her job.  Her pHQ9 and GAD scores were high at last appointment.  She has done counseling in the past. She says it is hard for her to stay committed to it so she has not had results with it.  She has previously tried Cymbalta, lexapro and prozac.  Started her on zoloft 25 mg daily.  Patient reports ***.        09/18/2022    1:21 PM 02/27/2022    3:32 PM 01/31/2021    8:14 AM 04/04/2018   10:56 AM 03/20/2016   10:13 AM  Depression screen PHQ 2/9  Decreased Interest 3 1 0 0 2  Down, Depressed, Hopeless 3 1 0 0 3  PHQ - 2 Score 6 2 0 0 5  Altered sleeping 3 2  0 3  Tired, decreased energy 2 2  0 3  Change in appetite 3 1  0 2  Feeling bad or failure about yourself  2 1  0 2  Trouble concentrating 3 2  0 3  Moving slowly or fidgety/restless 3 1  0 2  Suicidal thoughts 0 0  0 1  PHQ-9 Score 22 11  0 21  Difficult doing work/chores Extremely dIfficult Somewhat difficult  Not difficult at all Extremely dIfficult       09/18/2022    1:21 PM 02/27/2022    3:32 PM 03/20/2016   10:41 AM  GAD 7 : Generalized Anxiety Score  Nervous, Anxious, on Edge 3 2 2   Control/stop worrying 2 2 3   Worry too  much - different things 3 2 3   Trouble relaxing 1 3 2   Restless 2 3 3   Easily annoyed or irritable 3 3 3   Afraid - awful might happen 1 2 3   Total GAD 7 Score 15 17 19   Anxiety Difficulty Extremely difficult Somewhat difficult Extremely difficult     Relevant past medical, surgical, family and social history reviewed and updated as indicated. Interim medical history since our last visit reviewed. Allergies and medications reviewed and updated.  Review of Systems  Constitutional: Negative for fever or weight change.  Respiratory: Negative for cough and shortness of breath.   Cardiovascular: Negative for chest pain or palpitations.  Gastrointestinal: Negative for abdominal pain, no bowel changes.  Musculoskeletal: Negative for gait problem or joint swelling.  Skin: Negative for rash.  Neurological: Negative for dizziness, positive headache.  No other specific complaints in a complete review of systems (except as listed in HPI above).      Objective:    There were no vitals taken for this  visit.  Wt Readings from Last 3 Encounters:  09/18/22 222 lb (100.7 kg)  02/27/22 210 lb 1.6 oz (95.3 kg)  05/01/21 219 lb (99.3 kg)    Physical Exam  Constitutional: Patient appears well-developed and well-nourished. Obese  No distress.  HEENT: head atraumatic, normocephalic, pupils equal and reactive to light, neck supple Cardiovascular: Normal rate, regular rhythm and normal heart sounds.  No murmur heard. No BLE edema. Pulmonary/Chest: Effort normal and breath sounds normal. No respiratory distress. Abdominal: Soft.  There is no tenderness. Psychiatric: Patient has a anxious and depressed mood and affect. behavior is normal. Judgment and thought content normal.  Results for orders placed or performed in visit on 09/18/22  HIV Antibody (routine testing w rflx)  Result Value Ref Range   HIV 1&2 Ab, 4th Generation NON-REACTIVE NON-REACTIVE  Hepatitis C antibody  Result Value Ref Range    Hepatitis C Ab NON-REACTIVE NON-REACTIVE  RPR  Result Value Ref Range   RPR Ser Ql NON-REACTIVE NON-REACTIVE  POCT urine pregnancy  Result Value Ref Range   Preg Test, Ur Negative Negative  Cervicovaginal ancillary only  Result Value Ref Range   Bacterial Vaginitis (gardnerella) Positive (A)    Trichomonas Negative    Chlamydia Negative    Neisseria Gonorrhea Negative    Comment      Normal Reference Range Bacterial Vaginosis - Negative   Comment Normal Reference Range Trichomonas - Negative    Comment Normal Reference Ranger Chlamydia - Negative    Comment      Normal Reference Range Neisseria Gonorrhea - Negative      Assessment & Plan:   Problem List Items Addressed This Visit   None    Follow up plan: No follow-ups on file.

## 2022-12-12 DIAGNOSIS — Z419 Encounter for procedure for purposes other than remedying health state, unspecified: Secondary | ICD-10-CM | POA: Diagnosis not present

## 2023-01-03 ENCOUNTER — Ambulatory Visit (INDEPENDENT_AMBULATORY_CARE_PROVIDER_SITE_OTHER): Payer: Medicaid Other | Admitting: Nurse Practitioner

## 2023-01-03 ENCOUNTER — Other Ambulatory Visit: Payer: Self-pay

## 2023-01-03 ENCOUNTER — Encounter: Payer: Self-pay | Admitting: Nurse Practitioner

## 2023-01-03 VITALS — BP 120/84 | HR 94 | Temp 98.3°F | Resp 18 | Ht 62.0 in | Wt 221.0 lb

## 2023-01-03 DIAGNOSIS — T7840XA Allergy, unspecified, initial encounter: Secondary | ICD-10-CM

## 2023-01-03 DIAGNOSIS — I10 Essential (primary) hypertension: Secondary | ICD-10-CM

## 2023-01-03 DIAGNOSIS — Z6841 Body Mass Index (BMI) 40.0 and over, adult: Secondary | ICD-10-CM

## 2023-01-03 DIAGNOSIS — F411 Generalized anxiety disorder: Secondary | ICD-10-CM | POA: Diagnosis not present

## 2023-01-03 DIAGNOSIS — F331 Major depressive disorder, recurrent, moderate: Secondary | ICD-10-CM | POA: Diagnosis not present

## 2023-01-03 DIAGNOSIS — L732 Hidradenitis suppurativa: Secondary | ICD-10-CM | POA: Diagnosis not present

## 2023-01-03 MED ORDER — SERTRALINE HCL 25 MG PO TABS
25.0000 mg | ORAL_TABLET | Freq: Every day | ORAL | 1 refills | Status: DC
Start: 2023-01-03 — End: 2023-02-25

## 2023-01-03 MED ORDER — CONTRAVE 8-90 MG PO TB12
ORAL_TABLET | ORAL | 0 refills | Status: DC
Start: 2023-01-03 — End: 2023-01-24

## 2023-01-03 NOTE — Assessment & Plan Note (Signed)
Start Contrave.  Continue to work on lifestyle modification including eating a well-balanced diet with portion control.

## 2023-01-03 NOTE — Progress Notes (Signed)
BP 120/84   Pulse 94   Temp 98.3 F (36.8 C) (Oral)   Resp 18   Ht 5\' 2"  (1.575 m)   Wt 221 lb (100.2 kg)   SpO2 99%   BMI 40.42 kg/m    Subjective:    Patient ID: Kristina Mccann, female    DOB: [birth year], 31 y.o.   MRN: [id number]  HPI: Kristina Mccann is a 32 y.o. female  Chief Complaint  Patient presents with   Obesity   Depression   Anxiety    HTN: her blood pressure today is 120/84. She is not currently on medications.  She denies any shortness of breath, headaches, blurred vision, or chest pain.    Obesity: her weight at last appointment was  222 lbs with a BMI of 40.60, weight today is 221 lbs with a BMI of 40.42.  we previously referred her to bariatric surgery and she had her appointment and decided that was not the right course for her.  She wanted to try weight loss medication.  Sent in zepbound. She was unable to get approved. Patient reports that she would like to try something else. Will start her on contrave.  Discussed side effects and how to take medication.   Depression/aniexty: patient reports her anxiety has gotten worse due to her boyfriend cheating on her, she lost her job.  Her pHQ9 and GAD scores were high at last appointment.  She has done counseling in the past. She says it is hard for her to stay committed to it so she has not had results with it.  She has previously tried Cymbalta, lexapro and prozac.  Started her on zoloft 25 mg daily.  Patient reports she is doing better, will send in refills.        01/03/2023    2:40 PM 09/18/2022    1:21 PM 02/27/2022    3:32 PM 01/31/2021    8:14 AM 04/04/2018   10:56 AM  Depression screen PHQ 2/9  Decreased Interest 1 3 1  0 0  Down, Depressed, Hopeless 1 3 1  0 0  PHQ - 2 Score 2 6 2  0 0  Altered sleeping 2 3 2   0  Tired, decreased energy 2 2 2   0  Change in appetite 2 3 1   0  Feeling bad or failure about yourself  1 2 1   0  Trouble concentrating 1 3 2   0  Moving slowly or fidgety/restless 1 3 1   0   Suicidal thoughts 0 0 0  0  PHQ-9 Score 11 22 11   0  Difficult doing work/chores Somewhat difficult Extremely dIfficult Somewhat difficult  Not difficult at all       01/03/2023    2:40 PM 09/18/2022    1:21 PM 02/27/2022    3:32 PM 03/20/2016   10:41 AM  GAD 7 : Generalized Anxiety Score  Nervous, Anxious, on Edge 1 3 2 2   Control/stop worrying 1 2 2 3   Worry too much - different things 1 3 2 3   Trouble relaxing 1 1 3 2   Restless 1 2 3 3   Easily annoyed or irritable 2 3 3 3   Afraid - awful might happen 0 1 2 3   Total GAD 7 Score 7 15 17 19   Anxiety Difficulty Somewhat difficult Extremely difficult Somewhat difficult Extremely difficult    Allergies: she would like to have allergy testing done.  Referral placed to allergy.   HS: she reports she gets abscesses under  her breasts, her axilla and groin. Discussed likely Hidradenitis suppurativa, will send referral to dermatology.  Relevant past medical, surgical, family and social history reviewed and updated as indicated. Interim medical history since our last visit reviewed. Allergies and medications reviewed and updated.  Review of Systems  Constitutional: Negative for fever or weight change.  Respiratory: Negative for cough and shortness of breath.   Cardiovascular: Negative for chest pain or palpitations.  Gastrointestinal: Negative for abdominal pain, no bowel changes.  Musculoskeletal: Negative for gait problem or joint swelling.  Skin: Negative for rash.  Neurological: Negative for dizziness, positive headache.  No other specific complaints in a complete review of systems (except as listed in HPI above).      Objective:    BP 120/84   Pulse 94   Temp 98.3 F (36.8 C) (Oral)   Resp 18   Ht 5\' 2"  (1.575 m)   Wt 221 lb (100.2 kg)   SpO2 99%   BMI 40.42 kg/m   Wt Readings from Last 3 Encounters:  01/03/23 221 lb (100.2 kg)  09/18/22 222 lb (100.7 kg)  02/27/22 210 lb 1.6 oz (95.3 kg)    Physical  Exam  Constitutional: Patient appears well-developed and well-nourished. Obese  No distress.  HEENT: head atraumatic, normocephalic, pupils equal and reactive to light, neck supple Cardiovascular: Normal rate, regular rhythm and normal heart sounds.  No murmur heard. No BLE edema. Pulmonary/Chest: Effort normal and breath sounds normal. No respiratory distress. Abdominal: Soft.  There is no tenderness. Psychiatric: Patient has a anxious and depressed mood and affect. behavior is normal. Judgment and thought content normal.  Results for orders placed or performed in visit on 09/18/22  HIV Antibody (routine testing w rflx)  Result Value Ref Range   HIV 1&2 Ab, 4th Generation NON-REACTIVE NON-REACTIVE  Hepatitis C antibody  Result Value Ref Range   Hepatitis C Ab NON-REACTIVE NON-REACTIVE  RPR  Result Value Ref Range   RPR Ser Ql NON-REACTIVE NON-REACTIVE  POCT urine pregnancy  Result Value Ref Range   Preg Test, Ur Negative Negative  Cervicovaginal ancillary only  Result Value Ref Range   Bacterial Vaginitis (gardnerella) Positive (A)    Trichomonas Negative    Chlamydia Negative    Neisseria Gonorrhea Negative    Comment      Normal Reference Range Bacterial Vaginosis - Negative   Comment Normal Reference Range Trichomonas - Negative    Comment Normal Reference Ranger Chlamydia - Negative    Comment      Normal Reference Range Neisseria Gonorrhea - Negative      Assessment & Plan:   Problem List Items Addressed This Visit       Cardiovascular and Mediastinum   Essential hypertension    Blood pressure today good at 120/84. Not currently on medications, has been working on lifestyle modification.        Other   Class 3 severe obesity due to excess calories with serious comorbidity and body mass index (BMI) of 40.0 to 44.9 in adult [facility])    Start Contrave.  Continue to work on lifestyle modification including eating a well-balanced diet with portion control.       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

## 2023-01-03 NOTE — Assessment & Plan Note (Signed)
She is currently taking Zoloft 25 mg daily

## 2023-01-03 NOTE — Assessment & Plan Note (Signed)
She is currently taking Zoloft 25 mg daily 

## 2023-01-03 NOTE — Assessment & Plan Note (Signed)
Blood pressure today good at 120/84. Not currently on medications, has been working on lifestyle modification.

## 2023-01-11 ENCOUNTER — Telehealth: Payer: Medicaid Other | Admitting: Family Medicine

## 2023-01-11 DIAGNOSIS — I10 Essential (primary) hypertension: Secondary | ICD-10-CM

## 2023-01-11 NOTE — Progress Notes (Signed)
[place]   Would like to look at restarting BP medication Needs to be seen by PCP to discuss and if updated labs are needed  Patient acknowledged agreement and understanding of the plan.

## 2023-01-12 DIAGNOSIS — Z419 Encounter for procedure for purposes other than remedying health state, unspecified: Secondary | ICD-10-CM | POA: Diagnosis not present

## 2023-01-14 ENCOUNTER — Ambulatory Visit: Payer: Medicaid Other | Admitting: Nurse Practitioner

## 2023-01-14 NOTE — Progress Notes (Deleted)
   There were no vitals taken for this visit.   Subjective:    Patient ID: Kristina Mccann, female    DOB: 06-21-1991, 32 y.o.   MRN: [id number]  HPI: Kristina Mccann is a 32 y.o. female  No chief complaint on file.   Relevant past medical, surgical, family and social history reviewed and updated as indicated. Interim medical history since our last visit reviewed. Allergies and medications reviewed and updated.  Review of Systems  Constitutional: Negative for fever or weight change.  Respiratory: Negative for cough and shortness of breath.   Cardiovascular: Negative for chest pain or palpitations.  Gastrointestinal: Negative for abdominal pain, no bowel changes.  Musculoskeletal: Negative for gait problem or joint swelling.  Skin: Negative for rash.  Neurological: Negative for dizziness or headache.  No other specific complaints in a complete review of systems (except as listed in HPI above).      Objective:    There were no vitals taken for this visit.  Wt Readings from Last 3 Encounters:  01/03/23 221 lb (100.2 kg)  09/18/22 222 lb (100.7 kg)  02/27/22 210 lb 1.6 oz (95.3 kg)    Physical Exam  Constitutional: Patient appears well-developed and well-nourished. Obese *** No distress.  HEENT: head atraumatic, normocephalic, pupils equal and reactive to light, ears ***, neck supple, throat within normal limits Cardiovascular: Normal rate, regular rhythm and normal heart sounds.  No murmur heard. No BLE edema. Pulmonary/Chest: Effort normal and breath sounds normal. No respiratory distress. Abdominal: Soft.  There is no tenderness. Psychiatric: Patient has a normal mood and affect. behavior is normal. Judgment and thought content normal.  Results for orders placed or performed in visit on 09/18/22  HIV Antibody (routine testing w rflx)  Result Value Ref Range   HIV 1&2 Ab, 4th Generation NON-REACTIVE NON-REACTIVE  Hepatitis C antibody  Result Value Ref Range   Hepatitis  C Ab NON-REACTIVE NON-REACTIVE  RPR  Result Value Ref Range   RPR Ser Ql NON-REACTIVE NON-REACTIVE  POCT urine pregnancy  Result Value Ref Range   Preg Test, Ur Negative Negative  Cervicovaginal ancillary only  Result Value Ref Range   Bacterial Vaginitis (gardnerella) Positive (A)    Trichomonas Negative    Chlamydia Negative    Neisseria Gonorrhea Negative    Comment      Normal Reference Range Bacterial Vaginosis - Negative   Comment Normal Reference Range Trichomonas - Negative    Comment Normal Reference Ranger Chlamydia - Negative    Comment      Normal Reference Range Neisseria Gonorrhea - Negative      Assessment & Plan:   Problem List Items Addressed This Visit   None    Follow up plan: No follow-ups on file.

## 2023-01-24 ENCOUNTER — Encounter: Payer: Self-pay | Admitting: Nurse Practitioner

## 2023-01-24 ENCOUNTER — Ambulatory Visit (INDEPENDENT_AMBULATORY_CARE_PROVIDER_SITE_OTHER): Payer: Medicaid Other | Admitting: Nurse Practitioner

## 2023-01-24 ENCOUNTER — Other Ambulatory Visit: Payer: Self-pay

## 2023-01-24 VITALS — BP 140/90 | HR 98 | Temp 98.0°F | Resp 16 | Ht 62.0 in | Wt 214.1 lb

## 2023-01-24 DIAGNOSIS — Z131 Encounter for screening for diabetes mellitus: Secondary | ICD-10-CM | POA: Diagnosis not present

## 2023-01-24 DIAGNOSIS — E782 Mixed hyperlipidemia: Secondary | ICD-10-CM | POA: Diagnosis not present

## 2023-01-24 DIAGNOSIS — Z6841 Body Mass Index (BMI) 40.0 and over, adult: Secondary | ICD-10-CM

## 2023-01-24 DIAGNOSIS — I10 Essential (primary) hypertension: Secondary | ICD-10-CM

## 2023-01-24 LAB — CBC WITH DIFFERENTIAL/PLATELET
Eosinophils Absolute: 240 cells/uL (ref 15–500)
Eosinophils Relative: 3.2 %
Lymphs Abs: 2273 cells/uL (ref 850–3900)
MCH: 28.5 pg (ref 27.0–33.0)
MCHC: 32.8 g/dL (ref 32.0–36.0)

## 2023-01-24 MED ORDER — CONTRAVE 8-90 MG PO TB12
ORAL_TABLET | ORAL | 0 refills | Status: DC
Start: 2023-01-24 — End: 2023-02-25

## 2023-01-24 MED ORDER — NIFEDIPINE ER OSMOTIC RELEASE 60 MG PO TB24
60.0000 mg | ORAL_TABLET | Freq: Every day | ORAL | 0 refills | Status: DC
Start: 2023-01-24 — End: 2023-02-25

## 2023-01-24 NOTE — Assessment & Plan Note (Signed)
Restart nifedipine 60 mg daily, getting labs, come back in 4 weeks for recheck. Try and follow DASH diet.

## 2023-01-24 NOTE — Assessment & Plan Note (Signed)
Resent contrave prescription to different pharamcy

## 2023-01-24 NOTE — Progress Notes (Signed)
BP (!) 140/90   Pulse 98   Temp 98 F (36.7 C) (Oral)   Resp 16   Ht 5\' 2"  (1.575 m)   Wt 214 lb 1.6 oz (97.1 kg)   SpO2 99%   BMI 39.16 kg/m    Subjective:    Patient ID: Kristina Mccann, female    DOB: [birth year], 32 y.o.   MRN: [id number]  HPI: Kristina Mccann is a 32 y.o. female  Chief Complaint  Patient presents with   Hypertension    Running 157/115 and 174/121 when checked   Hypertension:  -Medications: not currently on medication, previously on nifedipine, hydralazine, hydrochlorothiazide, labetalol -Patient stopped taking medications a while ago, she would like to restart. Will restart nifedipine 60  mg daily.   -Checking BP at home (average): 150s/100 -Denies any SOB, CP, LE edema or symptoms of hypotension, reports some dizziness and blurred vision at night.  -Diet: recommend DASH diet -Exercise: recommend increasing physical activity to 150 min a week     01/24/2023    9:29 AM 01/24/2023    9:13 AM 01/03/2023    2:34 PM  Vitals with BMI  Height  5\' 2"  5\' 2"   Weight  214 lbs 2 oz 221 lbs  BMI  39.15 40.41  Systolic 140 118 295  Diastolic 90 74 84  Pulse  98 94     HLD:  -Medications: not currently on medications, working on lifestyle modification -Last lipid panel:  Lipid Panel     Component Value Date/Time   CHOL 180 02/27/2022 1602   TRIG 156 (H) 02/27/2022 1602   HDL 46 (L) 02/27/2022 1602   CHOLHDL 3.9 02/27/2022 1602   LDLCALC 107 (H) 02/27/2022 1602     Relevant past medical, surgical, family and social history reviewed and updated as indicated. Interim medical history since our last visit reviewed. Allergies and medications reviewed and updated.  Review of Systems  Constitutional: Negative for fever or weight change.  Respiratory: Negative for cough and shortness of breath.   Cardiovascular: Negative for chest pain or palpitations.  Gastrointestinal: Negative for abdominal pain, no bowel changes.  Musculoskeletal: Negative for gait  problem or joint swelling.  Skin: Negative for rash.  Neurological: Negative for dizziness or headache.  No other specific complaints in a complete review of systems (except as listed in HPI above).      Objective:    BP (!) 140/90   Pulse 98   Temp 98 F (36.7 C) (Oral)   Resp 16   Ht 5\' 2"  (1.575 m)   Wt 214 lb 1.6 oz (97.1 kg)   SpO2 99%   BMI 39.16 kg/m   Wt Readings from Last 3 Encounters:  01/24/23 214 lb 1.6 oz (97.1 kg)  01/03/23 221 lb (100.2 kg)  09/18/22 222 lb (100.7 kg)    Physical Exam  Constitutional: Patient appears well-developed and well-nourished. Obese  No distress.  HEENT: head atraumatic, normocephalic, pupils equal and reactive to light, neck supple, throat within normal limits Cardiovascular: Normal rate, regular rhythm and normal heart sounds.  No murmur heard. No BLE edema. Pulmonary/Chest: Effort normal and breath sounds normal. No respiratory distress. Abdominal: Soft.  There is no tenderness. Psychiatric: Patient has a normal mood and affect. behavior is normal. Judgment and thought content normal.   Results for orders placed or performed in visit on 09/18/22  HIV Antibody (routine testing w rflx)  Result Value Ref Range   HIV 1&2 Ab, 4th Generation NON-REACTIVE  NON-REACTIVE  Hepatitis C antibody  Result Value Ref Range   Hepatitis C Ab NON-REACTIVE NON-REACTIVE  RPR  Result Value Ref Range   RPR Ser Ql NON-REACTIVE NON-REACTIVE  POCT urine pregnancy  Result Value Ref Range   Preg Test, Ur Negative Negative  Cervicovaginal ancillary only  Result Value Ref Range   Bacterial Vaginitis (gardnerella) Positive (A)    Trichomonas Negative    Chlamydia Negative    Neisseria Gonorrhea Negative    Comment      Normal Reference Range Bacterial Vaginosis - Negative   Comment Normal Reference Range Trichomonas - Negative    Comment Normal Reference Ranger Chlamydia - Negative    Comment      Normal Reference Range Neisseria Gonorrhea - Negative       Assessment & Plan:   Problem List Items Addressed This Visit       Cardiovascular and Mediastinum   Essential hypertension - Primary    Restart nifedipine 60 mg daily, getting labs, come back in 4 weeks for recheck. Try and follow DASH diet.       Relevant Medications   NIFEdipine (PROCARDIA XL/NIFEDICAL XL) 60 MG 24 hr tablet   Other Relevant Orders   CBC with Differential/Platelet   COMPLETE METABOLIC PANEL WITH GFR     Other   Class 3 severe obesity due to excess calories with serious comorbidity and body mass index (BMI) of 40.0 to 44.9 in adult (HCC)    Resent contrave prescription to different pharamcy      Relevant Medications   Naltrexone-buPROPion HCl ER (CONTRAVE) 8-90 MG TB12   Hyperlipidemia   Relevant Medications   NIFEdipine (PROCARDIA XL/NIFEDICAL XL) 60 MG 24 hr tablet   Other Relevant Orders   Lipid panel   Other Visit Diagnoses     Screening for diabetes mellitus       Relevant Orders   Hemoglobin A1c        Follow up plan: Return in about 4 weeks (around 02/21/2023) for follow up.

## 2023-01-25 LAB — CBC WITH DIFFERENTIAL/PLATELET
Absolute Monocytes: 443 cells/uL (ref 200–950)
Basophils Absolute: 23 cells/uL (ref 0–200)
Basophils Relative: 0.3 %
HCT: 36.9 % (ref 35.0–45.0)
Hemoglobin: 12.1 g/dL (ref 11.7–15.5)
MCV: 86.8 fL (ref 80.0–100.0)
MPV: 11 fL (ref 7.5–12.5)
Monocytes Relative: 5.9 %
Neutro Abs: 4523 cells/uL (ref 1500–7800)
Neutrophils Relative %: 60.3 %
Platelets: 420 10*3/uL — ABNORMAL HIGH (ref 140–400)
RBC: 4.25 10*6/uL (ref 3.80–5.10)
RDW: 13.4 % (ref 11.0–15.0)
Total Lymphocyte: 30.3 %
WBC: 7.5 10*3/uL (ref 3.8–10.8)

## 2023-01-25 LAB — COMPLETE METABOLIC PANEL WITH GFR
AG Ratio: 1.1 (calc) (ref 1.0–2.5)
ALT: 15 U/L (ref 6–29)
AST: 24 U/L (ref 10–30)
Albumin: 3.9 g/dL (ref 3.6–5.1)
Alkaline phosphatase (APISO): 89 U/L (ref 31–125)
BUN: 10 mg/dL (ref 7–25)
CO2: 28 mmol/L (ref 20–32)
Calcium: 9.5 mg/dL (ref 8.6–10.2)
Chloride: 103 mmol/L (ref 98–110)
Creat: 0.84 mg/dL (ref 0.50–0.97)
Globulin: 3.5 g/dL (calc) (ref 1.9–3.7)
Glucose, Bld: 96 mg/dL (ref 65–99)
Potassium: 4.5 mmol/L (ref 3.5–5.3)
Sodium: 138 mmol/L (ref 135–146)
Total Bilirubin: 0.2 mg/dL (ref 0.2–1.2)
Total Protein: 7.4 g/dL (ref 6.1–8.1)
eGFR: 95 mL/min/{1.73_m2} (ref >=60)

## 2023-01-25 LAB — HEMOGLOBIN A1C
Hgb A1c MFr Bld: 5.8 % of total Hgb — ABNORMAL HIGH (ref <5.7)
Mean Plasma Glucose: 120 mg/dL
eAG (mmol/L): 6.6 mmol/L

## 2023-01-25 LAB — LIPID PANEL
Cholesterol: 170 mg/dL (ref <200)
HDL: 53 mg/dL (ref >=50)
LDL Cholesterol (Calc): 99 mg/dL (calc)
Non-HDL Cholesterol (Calc): 117 mg/dL (calc) (ref <130)
Total CHOL/HDL Ratio: 3.2 (calc) (ref <5.0)
Triglycerides: 86 mg/dL (ref <150)

## 2023-02-11 DIAGNOSIS — Z419 Encounter for procedure for purposes other than remedying health state, unspecified: Secondary | ICD-10-CM | POA: Diagnosis not present

## 2023-02-20 NOTE — Progress Notes (Deleted)
There were no vitals taken for this visit.   Subjective:    Patient ID: Kristina Mccann, female    DOB: 1990/11/17, 32 y.o.   MRN: [id number]  HPI: Kristina Mccann is a 32 y.o. female  No chief complaint on file.  Hypertension:  -Medications: restarted nifedipine 60 mg daily here for 4 week check, previously on hydrochlorothiazide, hydralazine and labetalol -Patient is compliant with above medications and reports no side effects. -Checking BP at home (average): *** -Highest BP at home: *** -Lowest BP at home: *** -Denies any SOB, CP, vision changes, LE edema or symptoms of hypotension -Diet: recommend DASH diet -Exercise: increase physical activity to 150 min a week       01/24/2023    9:29 AM 01/24/2023    9:13 AM 01/03/2023    2:34 PM  Vitals with BMI  Height  5\' 2"  5\' 2"   Weight  214 lbs 2 oz 221 lbs  BMI  39.15 40.41  Systolic 140 118 409  Diastolic 90 74 84  Pulse  98 94      Relevant past medical, surgical, family and social history reviewed and updated as indicated. Interim medical history since our last visit reviewed. Allergies and medications reviewed and updated.  Review of Systems  Constitutional: Negative for fever or weight change.  Respiratory: Negative for cough and shortness of breath.   Cardiovascular: Negative for chest pain or palpitations.  Gastrointestinal: Negative for abdominal pain, no bowel changes.  Musculoskeletal: Negative for gait problem or joint swelling.  Skin: Negative for rash.  Neurological: Negative for dizziness or headache.  No other specific complaints in a complete review of systems (except as listed in HPI above).      Objective:    There were no vitals taken for this visit.  Wt Readings from Last 3 Encounters:  01/24/23 214 lb 1.6 oz (97.1 kg)  01/03/23 221 lb (100.2 kg)  09/18/22 222 lb (100.7 kg)    Physical Exam  Constitutional: Patient appears well-developed and well-nourished. Obese  No distress.  HEENT:  head atraumatic, normocephalic, pupils equal and reactive to light, neck supple, throat within normal limits Cardiovascular: Normal rate, regular rhythm and normal heart sounds.  No murmur heard. No BLE edema. Pulmonary/Chest: Effort normal and breath sounds normal. No respiratory distress. Abdominal: Soft.  There is no tenderness. Psychiatric: Patient has a normal mood and affect. behavior is normal. Judgment and thought content normal.   Results for orders placed or performed in visit on 01/24/23  CBC with Differential/Platelet  Result Value Ref Range   WBC 7.5 3.8 - 10.8 Thousand/uL   RBC 4.25 3.80 - 5.10 Million/uL   Hemoglobin 12.1 11.7 - 15.5 g/dL   HCT 81.1 91.4 - 78.2 %   MCV 86.8 80.0 - 100.0 fL   MCH 28.5 27.0 - 33.0 pg   MCHC 32.8 32.0 - 36.0 g/dL   RDW 95.6 21.3 - 08.6 %   Platelets 420 (H) 140 - 400 Thousand/uL   MPV 11.0 7.5 - 12.5 fL   Neutro Abs 4,523 1,500 - 7,800 cells/uL   Lymphs Abs 2,273 850 - 3,900 cells/uL   Absolute Monocytes 443 200 - 950 cells/uL   Eosinophils Absolute 240 15 - 500 cells/uL   Basophils Absolute 23 0 - 200 cells/uL   Neutrophils Relative % 60.3 %   Total Lymphocyte 30.3 %   Monocytes Relative 5.9 %   Eosinophils Relative 3.2 %   Basophils Relative 0.3 %  COMPLETE  METABOLIC PANEL WITH GFR  Result Value Ref Range   Glucose, Bld 96 65 - 99 mg/dL   BUN 10 7 - 25 mg/dL   Creat 2.84 1.32 - 4.40 mg/dL   eGFR 95 > OR = 60 NU/UVO/5.36U4   BUN/Creatinine Ratio SEE NOTE: 6 - 22 (calc)   Sodium 138 135 - 146 mmol/L   Potassium 4.5 3.5 - 5.3 mmol/L   Chloride 103 98 - 110 mmol/L   CO2 28 20 - 32 mmol/L   Calcium 9.5 8.6 - 10.2 mg/dL   Total Protein 7.4 6.1 - 8.1 g/dL   Albumin 3.9 3.6 - 5.1 g/dL   Globulin 3.5 1.9 - 3.7 g/dL (calc)   AG Ratio 1.1 1.0 - 2.5 (calc)   Total Bilirubin 0.2 0.2 - 1.2 mg/dL   Alkaline phosphatase (APISO) 89 31 - 125 U/L   AST 24 10 - 30 U/L   ALT 15 6 - 29 U/L  Lipid panel  Result Value Ref Range   Cholesterol  170 <200 mg/dL   HDL 53 > OR = 50 mg/dL   Triglycerides 86 <403 mg/dL   LDL Cholesterol (Calc) 99 mg/dL (calc)   Total CHOL/HDL Ratio 3.2 <5.0 (calc)   Non-HDL Cholesterol (Calc) 117 <130 mg/dL (calc)  Hemoglobin K7Q  Result Value Ref Range   Hgb A1c MFr Bld 5.8 (H) <5.7 % of total Hgb   Mean Plasma Glucose 120 mg/dL   eAG (mmol/L) 6.6 mmol/L      Assessment & Plan:   Problem List Items Addressed This Visit   None    Follow up plan: No follow-ups on file.

## 2023-02-21 ENCOUNTER — Ambulatory Visit: Payer: Medicaid Other | Admitting: Nurse Practitioner

## 2023-02-25 ENCOUNTER — Ambulatory Visit (INDEPENDENT_AMBULATORY_CARE_PROVIDER_SITE_OTHER): Payer: Medicaid Other | Admitting: Nurse Practitioner

## 2023-02-25 ENCOUNTER — Other Ambulatory Visit: Payer: Self-pay

## 2023-02-25 ENCOUNTER — Encounter: Payer: Self-pay | Admitting: Nurse Practitioner

## 2023-02-25 VITALS — BP 126/82 | HR 84 | Temp 97.9°F | Resp 16 | Ht 62.0 in | Wt 214.0 lb

## 2023-02-25 DIAGNOSIS — F331 Major depressive disorder, recurrent, moderate: Secondary | ICD-10-CM

## 2023-02-25 DIAGNOSIS — R112 Nausea with vomiting, unspecified: Secondary | ICD-10-CM | POA: Diagnosis not present

## 2023-02-25 DIAGNOSIS — F411 Generalized anxiety disorder: Secondary | ICD-10-CM

## 2023-02-25 DIAGNOSIS — Z6841 Body Mass Index (BMI) 40.0 and over, adult: Secondary | ICD-10-CM | POA: Diagnosis not present

## 2023-02-25 DIAGNOSIS — I10 Essential (primary) hypertension: Secondary | ICD-10-CM

## 2023-02-25 MED ORDER — SERTRALINE HCL 25 MG PO TABS
25.0000 mg | ORAL_TABLET | Freq: Every day | ORAL | 1 refills | Status: DC
Start: 2023-02-25 — End: 2023-07-31

## 2023-02-25 MED ORDER — ONDANSETRON 4 MG PO TBDP
4.0000 mg | ORAL_TABLET | Freq: Three times a day (TID) | ORAL | 0 refills | Status: DC | PRN
Start: 2023-02-25 — End: 2023-04-05

## 2023-02-25 MED ORDER — NIFEDIPINE ER OSMOTIC RELEASE 60 MG PO TB24
60.0000 mg | ORAL_TABLET | Freq: Every day | ORAL | 1 refills | Status: DC
Start: 2023-02-25 — End: 2023-07-31

## 2023-02-25 MED ORDER — CONTRAVE 8-90 MG PO TB12
2.0000 | ORAL_TABLET | Freq: Two times a day (BID) | ORAL | 3 refills | Status: DC
Start: 2023-02-25 — End: 2023-04-05

## 2023-02-25 NOTE — Assessment & Plan Note (Signed)
Restart zoloft 25 mg daily, be consistent with taking it

## 2023-02-25 NOTE — Assessment & Plan Note (Signed)
Continue taking nifedipine 60 mg daily

## 2023-02-25 NOTE — Assessment & Plan Note (Signed)
Sent contrave to mail order pharmacy

## 2023-02-25 NOTE — Progress Notes (Signed)
BP 126/82   Pulse 84   Temp 97.9 F (36.6 C) (Oral)   Resp 16   Ht 5\' 2"  (1.575 m)   Wt 214 lb (97.1 kg)   SpO2 98%   BMI 39.14 kg/m    Subjective:    Patient ID: Kristina Mccann, female    DOB: 07-17-1991, 32 y.o.   MRN: [id number]  HPI: Kristina Mccann is a 32 y.o. female  Chief Complaint  Patient presents with   Hypertension   Depression    Follow up   Obesity   Hypertension:  -Medications: restarted nifedipine 60 mg daily here for 4 week check, previously on hydrochlorothiazide, hydralazine and labetalol -Patient is compliant with above medications and reports no side effects. -Checking BP at home (average): 120s-130s -Denies any SOB, CP, vision changes, LE edema or symptoms of hypotension -Diet: recommend DASH diet -Exercise: increase physical activity to 150 min a week       02/25/2023    8:10 AM 01/24/2023    9:29 AM 01/24/2023    9:13 AM  Vitals with BMI  Height 5\' 2"   5\' 2"   Weight 214 lbs  214 lbs 2 oz  BMI 39.13  39.15  Systolic 126 140 562  Diastolic 82 90 74  Pulse 84  98      Obesity:  Current weight : 214 lbs BMI: 39.14 Treatment Tried: tried to get her approved for [employer], insurance would not cover, will send to specialty pharmacy Comorbidities: HTN,   Depression/anxiety Medication zoloft 25 mg daily Compliant no, she says she has been out for about a month, will send in refill, discussed the importance of taking medication consistently Side effects none PHQ9 positive GAD positive     02/25/2023    8:14 AM 01/24/2023    9:12 AM 01/03/2023    2:40 PM 09/18/2022    1:21 PM 02/27/2022    3:32 PM  Depression screen PHQ 2/9  Decreased Interest 1 1 1 3 1   Down, Depressed, Hopeless 1 1 1 3 1   PHQ - 2 Score 2 2 2 6 2   Altered sleeping 1 1 2 3 2   Tired, decreased energy 1 1 2 2 2   Change in appetite 1 1 2 3 1   Feeling bad or failure about yourself  1 1 1 2 1   Trouble concentrating 1 1 1 3 2   Moving slowly or fidgety/restless 1 1 1 3 1    Suicidal thoughts 0 0 0 0 0  PHQ-9 Score 8 8 11 22 11   Difficult doing work/chores Somewhat difficult Somewhat difficult Somewhat difficult Extremely dIfficult Somewhat difficult       02/25/2023    8:14 AM 01/24/2023    9:13 AM 01/03/2023    2:40 PM 09/18/2022    1:21 PM  GAD 7 : Generalized Anxiety Score  Nervous, Anxious, on Edge 1 1 1 3   Control/stop worrying 1 1 1 2   Worry too much - different things 1 1 1 3   Trouble relaxing 1 1 1 1   Restless 1 1 1 2   Easily annoyed or irritable 1 1 2 3   Afraid - awful might happen 1 1 0 1  Total GAD 7 Score 7 7 7 15   Anxiety Difficulty Somewhat difficult Somewhat difficult Somewhat difficult Extremely difficult   Nausea/diarrhea/cramping:  she reports that last week she had stomach cramping, nausea, vomiting and diarrhea.  She reports that she did miss work.  She says she is starting to feel better.  Discussed likely gi bug.  Recommend following BRAT diet. Push fluids and will send in zofran for nausea.  [facility]: 6/25 ish  Relevant past medical, surgical, family and social history reviewed and updated as indicated. Interim medical history since our last visit reviewed. Allergies and medications reviewed and updated.  Review of Systems  Constitutional: Negative for fever or weight change.  Respiratory: Negative for cough and shortness of breath.   Cardiovascular: Negative for chest pain or palpitations.  Gastrointestinal: Negative for abdominal pain, diarrhea Musculoskeletal: Negative for gait problem or joint swelling.  Skin: Negative for rash.  Neurological: Negative for dizziness or headache.  No other specific complaints in a complete review of systems (except as listed in HPI above).      Objective:    BP 126/82   Pulse 84   Temp 97.9 F (36.6 C) (Oral)   Resp 16   Ht 5\' 2"  (1.575 m)   Wt 214 lb (97.1 kg)   SpO2 98%   BMI 39.14 kg/m   Wt Readings from Last 3 Encounters:  02/25/23 214 lb (97.1 kg)  01/24/23 214 lb 1.6 oz (97.1  kg)  01/03/23 221 lb (100.2 kg)    Physical Exam  Constitutional: Patient appears well-developed and well-nourished. Obese  No distress.  HEENT: head atraumatic, normocephalic, pupils equal and reactive to light, neck supple, throat within normal limits Cardiovascular: Normal rate, regular rhythm and normal heart sounds.  No murmur heard. No BLE edema. Pulmonary/Chest: Effort normal and breath sounds normal. No respiratory distress. Abdominal: Soft.  There is no tenderness. Psychiatric: Patient has a normal mood and affect. behavior is normal. Judgment and thought content normal.   Results for orders placed or performed in visit on 01/24/23  CBC with Differential/Platelet  Result Value Ref Range   WBC 7.5 3.8 - 10.8 Thousand/uL   RBC 4.25 3.80 - 5.10 Million/uL   Hemoglobin 12.1 11.7 - 15.5 g/dL   HCT 40.9 81.1 - 91.4 %   MCV 86.8 80.0 - 100.0 fL   MCH 28.5 27.0 - 33.0 pg   MCHC 32.8 32.0 - 36.0 g/dL   RDW 78.2 95.6 - 21.3 %   Platelets 420 (H) 140 - 400 Thousand/uL   MPV 11.0 7.5 - 12.5 fL   Neutro Abs 4,523 1,500 - 7,800 cells/uL   Lymphs Abs 2,273 850 - 3,900 cells/uL   Absolute Monocytes 443 200 - 950 cells/uL   Eosinophils Absolute 240 15 - 500 cells/uL   Basophils Absolute 23 0 - 200 cells/uL   Neutrophils Relative % 60.3 %   Total Lymphocyte 30.3 %   Monocytes Relative 5.9 %   Eosinophils Relative 3.2 %   Basophils Relative 0.3 %  COMPLETE METABOLIC PANEL WITH GFR  Result Value Ref Range   Glucose, Bld 96 65 - 99 mg/dL   BUN 10 7 - 25 mg/dL   Creat 0.86 5.78 - 4.69 mg/dL   eGFR 95 > OR = 60 GE/XBM/8.41L2   BUN/Creatinine Ratio SEE NOTE: 6 - 22 (calc)   Sodium 138 135 - 146 mmol/L   Potassium 4.5 3.5 - 5.3 mmol/L   Chloride 103 98 - 110 mmol/L   CO2 28 20 - 32 mmol/L   Calcium 9.5 8.6 - 10.2 mg/dL   Total Protein 7.4 6.1 - 8.1 g/dL   Albumin 3.9 3.6 - 5.1 g/dL   Globulin 3.5 1.9 - 3.7 g/dL (calc)   AG Ratio 1.1 1.0 - 2.5 (calc)   Total Bilirubin 0.2 0.2 -  1.2  mg/dL   Alkaline phosphatase (APISO) 89 31 - 125 U/L   AST 24 10 - 30 U/L   ALT 15 6 - 29 U/L  Lipid panel  Result Value Ref Range   Cholesterol 170 <200 mg/dL   HDL 53 > OR = 50 mg/dL   Triglycerides 86 <161 mg/dL   LDL Cholesterol (Calc) 99 mg/dL (calc)   Total CHOL/HDL Ratio 3.2 <5.0 (calc)   Non-HDL Cholesterol (Calc) 117 <130 mg/dL (calc)  Hemoglobin W9U  Result Value Ref Range   Hgb A1c MFr Bld 5.8 (H) <5.7 % of total Hgb   Mean Plasma Glucose 120 mg/dL   eAG (mmol/L) 6.6 mmol/L      Assessment & Plan:   Problem List Items Addressed This Visit       Cardiovascular and Mediastinum   Essential hypertension    Continue taking nifedipine 60 mg daily      Relevant Medications   NIFEdipine (PROCARDIA XL/NIFEDICAL XL) 60 MG 24 hr tablet     Other   Class 3 severe obesity due to excess calories with serious comorbidity and body mass index (BMI) of 40.0 to 44.9 in adult (HCC)    Sent contrave to mail order pharmacy      Relevant Medications   Naltrexone-buPROPion HCl ER (CONTRAVE) 8-90 MG TB12   GAD (generalized anxiety disorder)    Restart zoloft 25 mg daily, be consistent with taking it      Relevant Medications   sertraline (ZOLOFT) 25 MG tablet   Moderate episode of recurrent major depressive disorder (HCC)    Restart zoloft 25 mg daily, be consistent with taking it      Relevant Medications   sertraline (ZOLOFT) 25 MG tablet   Other Visit Diagnoses     Nausea and vomiting, unspecified vomiting type    -  Primary   improving,  discussed likely gi bug, recommend BRAT diet, push fluids,  patient given prescription for zofran   Relevant Medications   ondansetron (ZOFRAN-ODT) 4 MG disintegrating tablet        Follow up plan: Return in about 3 months (around 05/28/2023) for follow up.

## 2023-02-26 ENCOUNTER — Telehealth: Payer: Medicaid Other | Admitting: Physician Assistant

## 2023-02-26 ENCOUNTER — Encounter: Payer: Self-pay | Admitting: Nurse Practitioner

## 2023-02-26 DIAGNOSIS — A084 Viral intestinal infection, unspecified: Secondary | ICD-10-CM

## 2023-02-26 NOTE — Progress Notes (Signed)
Virtual Visit Consent   Kristina Mccann, you are scheduled for a virtual visit with a [facility] provider today. Just as with appointments in the office, your consent must be obtained to participate. Your consent will be active for this visit and any virtual visit you may have with one of our providers in the next 365 days. If you have a MyChart account, a copy of this consent can be sent to you electronically.  As this is a virtual visit, video technology does not allow for your provider to perform a traditional examination. This may limit your provider's ability to fully assess your condition. If your provider identifies any concerns that need to be evaluated in person or the need to arrange testing (such as labs, EKG, etc.), we will make arrangements to do so. Although advances in technology are sophisticated, we cannot ensure that it will always work on either your end or our end. If the connection with a video visit is poor, the visit may have to be switched to a telephone visit. With either a video or telephone visit, we are not always able to ensure that we have a secure connection.  By engaging in this virtual visit, you consent to the provision of healthcare and authorize for your insurance to be billed (if applicable) for the services provided during this visit. Depending on your insurance coverage, you may receive a charge related to this service.  I need to obtain your verbal consent now. Are you willing to proceed with your visit today? Kristina Mccann has provided verbal consent on 02/26/2023 for a virtual visit (video or telephone). Piedad Climes, [place]  Date: 02/26/2023 5:41 PM  Virtual Visit via Video Note   I, Piedad Climes, connected with  Kristina Mccann  ([id number], Apr 29, 1991) on 02/26/23 at  5:15 PM EDT by a video-enabled telemedicine application and verified that I am speaking with the correct person using two identifiers.  Location: Patient: Virtual Visit  Location Patient: Home Provider: Virtual Visit Location Provider: Home Office   I discussed the limitations of evaluation and management by telemedicine and the availability of in person appointments. The patient expressed understanding and agreed to proceed.    History of Present Illness: Kristina Mccann is a 32 y.o. who identifies as a female who was assigned female at birth, and is being seen today for some lingering GI symptoms after visit with her PCP yesterday for viral gastroenteritis.  Notes symptoms improved but still occasional vomiting -- few episodes today but mainly stomach acid. Denies fever, chills. Has been able to hydrate -- sipping on water and Gatorade. Notes urine is normal. Has not been able to pick up her script for zofran yet.   HPI: HPI  Problems:  Patient Active Problem List   Diagnosis Date Noted   Moderate episode of recurrent major depressive disorder (HCC) 01/03/2023   Hyperlipidemia 04/05/2022   Essential hypertension 02/27/2022   GAD (generalized anxiety disorder) 02/27/2022   Postpartum care following cesarean delivery 02/18/2021   Elevated blood pressure affecting pregnancy in third trimester, antepartum 12/20/2020   Pregnancy with history of cesarean section, antepartum 12/12/2020   Marijuana user 09/18/2020   Maternal morbid obesity in third trimester, antepartum (HCC) 09/08/2020   Supervision of high risk pregnancy, antepartum 09/02/2020   Class 3 severe obesity due to excess calories with serious comorbidity and body mass index (BMI) of 40.0 to 44.9 in adult (HCC) 04/04/2018   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   Vitamin D  deficiency 03/22/2016   Acanthosis nigricans 04/11/2015   Major depression, chronic (HCC) 04/11/2015   Gastro-esophageal reflux disease without esophagitis 04/11/2015   H/O suicide attempt 04/11/2015   Migraine without aura and responsive to treatment 04/11/2015   Seasonal allergic rhinitis 04/11/2015    Allergies: No Known  Allergies Medications:  Current Outpatient Medications:    etonogestrel (NEXPLANON) 68 MG IMPL implant, 1 each (68 mg total) by Subdermal route once for 1 dose., Disp: 1 each, Rfl: 0   Naltrexone-buPROPion HCl ER (CONTRAVE) 8-90 MG TB12, Take 2 tablets by mouth 2 (two) times daily. Start 1 tablet every morning for 7 days, then 1 tablet twice daily for 7 days, then 2 tablets every morning and one every evening, Disp: 120 tablet, Rfl: 3   NIFEdipine (PROCARDIA XL/NIFEDICAL XL) 60 MG 24 hr tablet, Take 1 tablet (60 mg total) by mouth daily., Disp: 90 tablet, Rfl: 1   ondansetron (ZOFRAN-ODT) 4 MG disintegrating tablet, Take 1 tablet (4 mg total) by mouth every 8 (eight) hours as needed for nausea or vomiting., Disp: 10 tablet, Rfl: 0   sertraline (ZOLOFT) 25 MG tablet, Take 1 tablet (25 mg total) by mouth daily., Disp: 90 tablet, Rfl: 1  Observations/Objective: Patient is well-developed, well-nourished in no acute distress.  Resting comfortably at home.  Head is normocephalic, atraumatic.  No labored breathing. Speech is clear and coherent with logical content.  Patient is alert and oriented at baseline.   Assessment and Plan: 1. Viral gastroenteritis  Symptoms improving but still with lingering nausea and occasional non-bloody emesis. Is hydrating well. Is going to pick up her Rx Zofran ODT in the morning.  Feel reasonable to write her out for tomorrow to further recover. Note provided. Supportive measures and BRAT diet reviewed. ER precautions discussed.   Follow Up Instructions: I discussed the assessment and treatment plan with the patient. The patient was provided an opportunity to ask questions and all were answered. The patient agreed with the plan and demonstrated an understanding of the instructions.  A copy of instructions were sent to the patient via MyChart unless otherwise noted below.   The patient was advised to call back or seek an in-person evaluation if the symptoms worsen or  if the condition fails to improve as anticipated.  Time:  I spent 10 minutes with the patient via telehealth technology discussing the above problems/concerns.    Piedad Climes, PA-C

## 2023-02-26 NOTE — Patient Instructions (Signed)
Ayan L Fishburn, thank you for joining Piedad Climes, PA-C for today's virtual visit.  While this provider is not your primary care provider (PCP), if your PCP is located in our provider database this encounter information will be shared with them immediately following your visit.   A [facility] MyChart account gives you access to today's visit and all your visits, tests, and labs performed at [facility] " click here if you don't have a [facility] MyChart account or go to [email or web address]  Consent: (Patient) Kristina Mccann provided verbal consent for this virtual visit at the beginning of the encounter.  Current Medications:  Current Outpatient Medications:    etonogestrel (NEXPLANON) 68 MG IMPL implant, 1 each (68 mg total) by Subdermal route once for 1 dose., Disp: 1 each, Rfl: 0   Naltrexone-buPROPion HCl ER (CONTRAVE) 8-90 MG TB12, Take 2 tablets by mouth 2 (two) times daily. Start 1 tablet every morning for 7 days, then 1 tablet twice daily for 7 days, then 2 tablets every morning and one every evening, Disp: 120 tablet, Rfl: 3   NIFEdipine (PROCARDIA XL/NIFEDICAL XL) 60 MG 24 hr tablet, Take 1 tablet (60 mg total) by mouth daily., Disp: 90 tablet, Rfl: 1   ondansetron (ZOFRAN-ODT) 4 MG disintegrating tablet, Take 1 tablet (4 mg total) by mouth every 8 (eight) hours as needed for nausea or vomiting., Disp: 10 tablet, Rfl: 0   sertraline (ZOLOFT) 25 MG tablet, Take 1 tablet (25 mg total) by mouth daily., Disp: 90 tablet, Rfl: 1   Medications ordered in this encounter:  No orders of the defined types were placed in this encounter.    *If you need refills on other medications prior to your next appointment, please contact your pharmacy*  Follow-Up: Call back or seek an in-person evaluation if the symptoms worsen or if the condition fails to improve as anticipated.  [facility] [phone]  Other Instructions Viral Gastroenteritis,  Adult  Viral gastroenteritis is also known as the stomach flu. This condition may affect your stomach, your small intestine, and your large intestine. It can cause sudden watery poop (diarrhea), fever, and vomiting. This condition is caused by certain germs (viruses). These germs can be passed from person to person very easily (are contagious). Having watery poop and vomiting can make you feel weak and cause you to not have enough water in your body (get dehydrated). This can make you tired and thirsty, make you have a dry mouth, and make it so you pee (urinate) less often. It is important to replace the fluids that you lose from having watery poop and vomiting. What are the causes? You can get sick by catching germs from other people. You can also get sick by: Eating food, drinking water, or touching a surface that has the germs on it (is contaminated). Sharing utensils or other personal items with a person who is sick. What increases the risk? Having a weak body defense system (immune system). Living with one or more children who are younger than 2 years. Living in a nursing home. Going on cruise ships. What are the signs or symptoms? Symptoms of this condition start suddenly. Symptoms may last for a few days or for as long as a week. Common symptoms include: Watery poop. Vomiting. Other symptoms include: Fever. Headache. Feeling tired (fatigue). Pain in the belly (abdomen). Chills. Feeling weak. Feeling like you may vomit (nauseous). Muscle aches. Not feeling hungry. How is this treated? This condition typically  goes away on its own. The focus of treatment is to replace the fluids that you lose. This condition may be treated with: An ORS (oral rehydration solution). This is a drink that helps you replace fluids and minerals your body lost. It is sold at pharmacies and stores. Medicines to help with your symptoms. Probiotic supplements to reduce symptoms of watery poop. Fluids  given through an IV tube, if needed. Older adults and people with other diseases or a weak body defense system are at higher risk for not having enough water in the body. Follow these instructions at home: Eating and drinking  Take an ORS as told by your doctor. Drink clear fluids in small amounts as you are able. Clear fluids include: Water. Ice chips. Fruit juice that has water added to it (is diluted). Low-calorie sports drinks. Drink enough fluid to keep your pee (urine) pale yellow. Eat small amounts of healthy foods every 3-4 hours as you are able. This may include whole grains, fruits, vegetables, lean meats, and yogurt. Avoid fluids that have a lot of sugar or caffeine in them. This includes energy drinks, sports drinks, and soda. Avoid spicy or fatty foods. Avoid alcohol. General instructions  Wash your hands often. This is very important after you have watery poop or you vomit. If you cannot use soap and water, use hand sanitizer. Make sure that all people in your home wash their hands well and often. Take over-the-counter and prescription medicines only as told by your doctor. Rest at home while you get better. Watch your condition for any changes. Take a warm bath to help with any burning or pain from having watery poop. Keep all follow-up visits. Contact a doctor if: You cannot keep fluids down. Your symptoms get worse. You have new symptoms. You feel light-headed or dizzy. You have muscle cramps. Get help right away if: You have chest pain. You have trouble breathing, or you are breathing very fast. You have a fast heartbeat. You feel very weak or you faint. You have a very bad headache, a stiff neck, or both. You have a rash. You have very bad pain, cramping, or bloating in your belly. Your skin feels cold and clammy. You feel mixed up (confused). You have pain when you pee. You have signs of not having enough water in the body, such as: Dark pee, hardly any  pee, or no pee. Cracked lips. Dry mouth. Sunken eyes. Feeling very sleepy. Feeling weak. You have signs of bleeding, such as: You see blood in your vomit. Your vomit looks like coffee grounds. You have bloody or black poop or poop that looks like tar. These symptoms may be an emergency. Get help right away. Call 911. Do not wait to see if the symptoms will go away. Do not drive yourself to the hospital. Summary Viral gastroenteritis is also known as the stomach flu. This condition can cause sudden watery poop (diarrhea), fever, and vomiting. These germs can be passed from person to person very easily. Take an ORS (oral rehydration solution) as told by your doctor. This is a drink that is sold at pharmacies and stores. Wash your hands often, especially after having watery poop or vomiting. If you cannot use soap and water, use hand sanitizer. This information is not intended to replace advice given to you by your health care provider. Make sure you discuss any questions you have with your health care provider. Document Revised: 05/29/2021 Document Reviewed: 05/29/2021 Elsevier Patient Education  2024 Elsevier  Inc.    If you have been instructed to have an in-person evaluation today at a local Urgent Care facility, please use the link below. It will take you to a list of all of our available [facility], including address, phone number and hours of operation. Please do not delay care.  [facility]  If you or a family member do not have a primary care provider, use the link below to schedule a visit and establish care. When you choose a [facility] primary care physician or advanced practice provider, you gain a long-term partner in health. Find a Primary Care Provider  Learn more about [facility]'s in-office and virtual care options:  - Get Care Now

## 2023-02-28 ENCOUNTER — Encounter: Payer: Self-pay | Admitting: Nurse Practitioner

## 2023-03-05 ENCOUNTER — Encounter: Payer: Self-pay | Admitting: Nurse Practitioner

## 2023-03-05 IMAGING — US US OB FOLLOW-UP
1 series · 13 of 28 positions shown · non-contrast
Comparison: none

CLINICAL DATA: Assess growth and amniotic fluid index. Twenty-nine
weeks 0 days based on EDC of 03/06/2021.

EXAM:
OBSTETRIC 14+ WK ULTRASOUND FOLLOW-UP

[Series 1: us ob follow-up · 0.23mm/px · 13 of 41 slices shown]
[im 2/41]
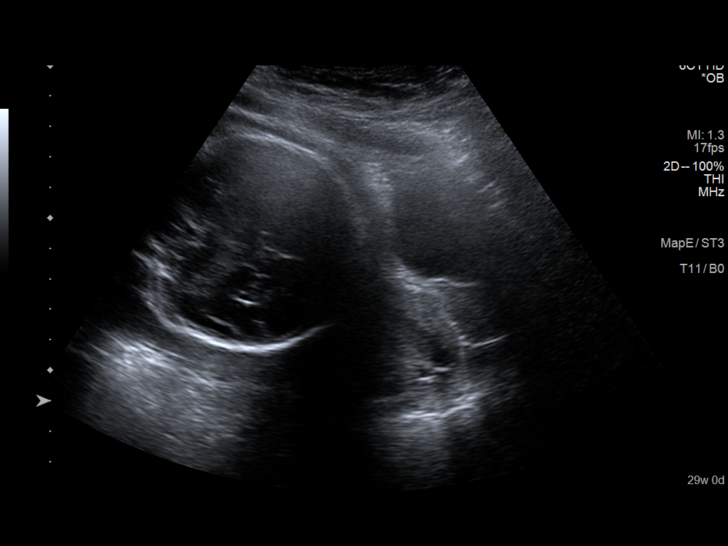
[im 5/41]
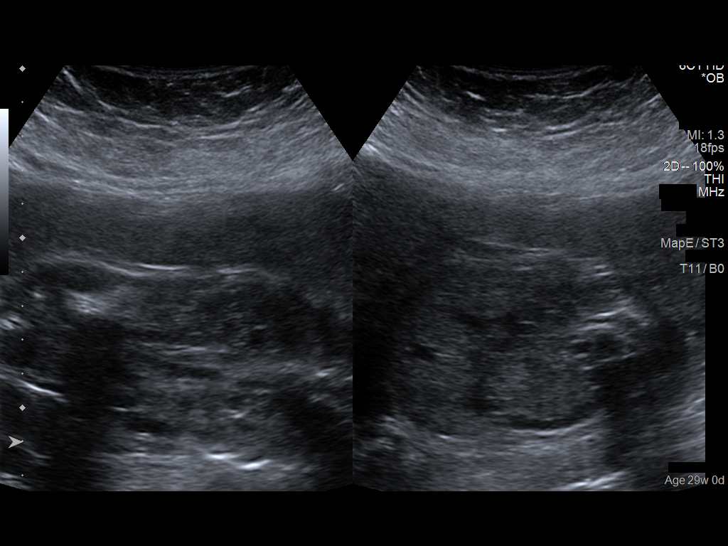
[im 8/41]
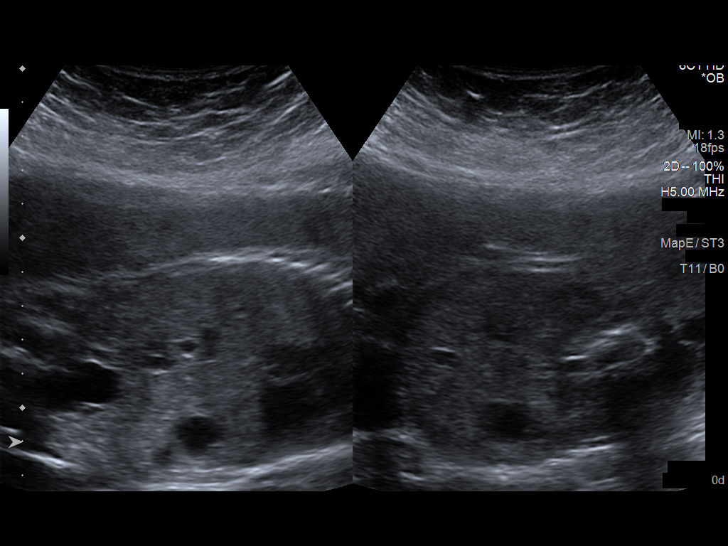
[im 11/41]
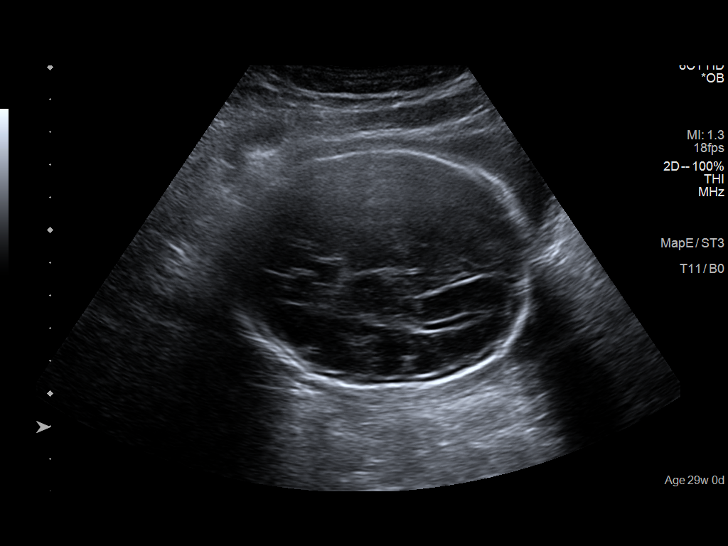
[im 14/41]
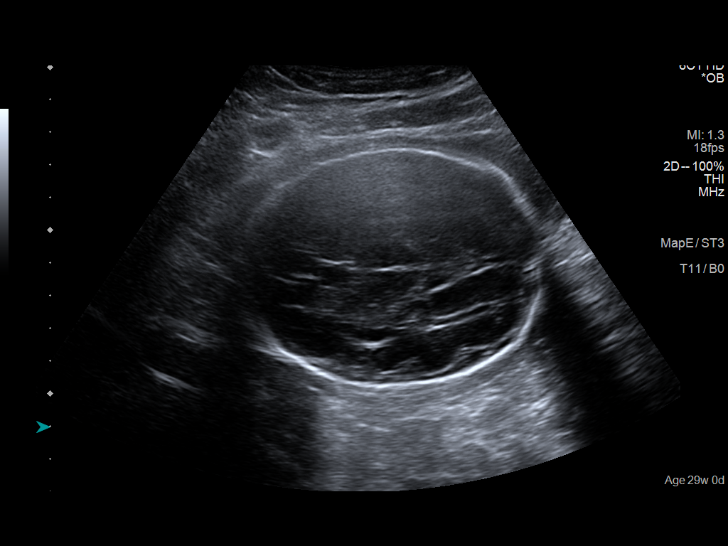
[im 17/41]
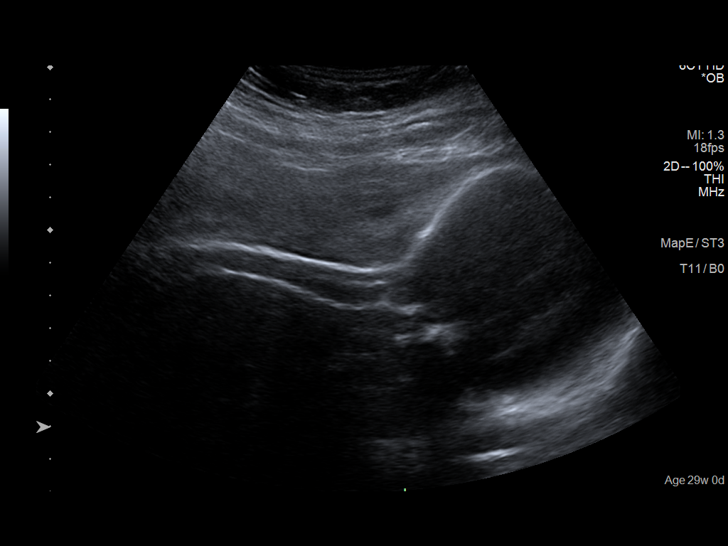
[im 21/41]
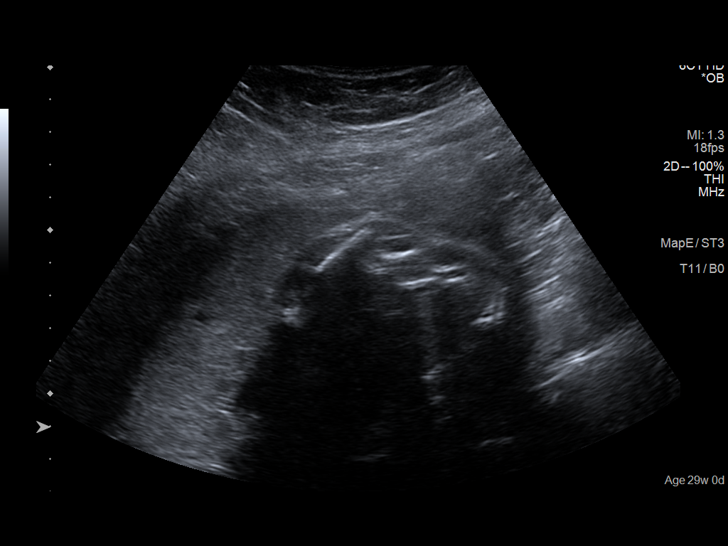
[im 24/41]
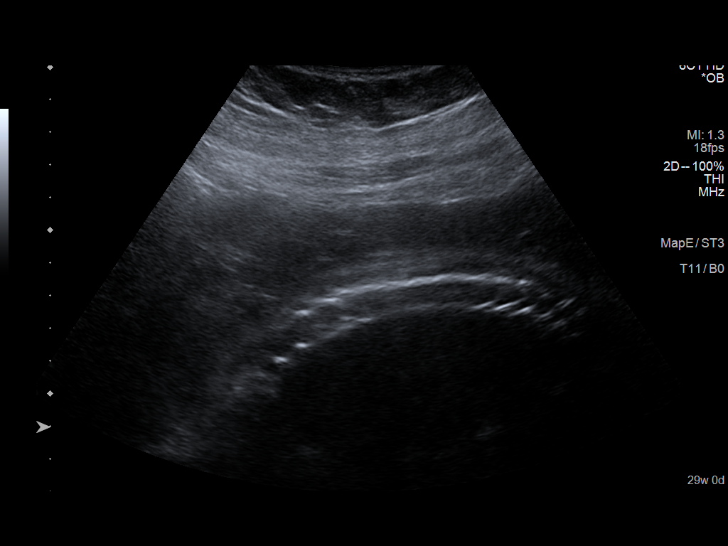
[im 27/41]
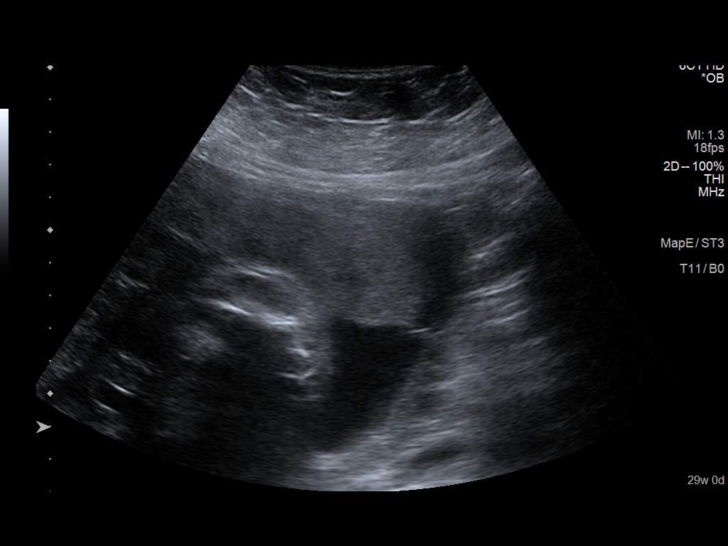
[im 30/41]
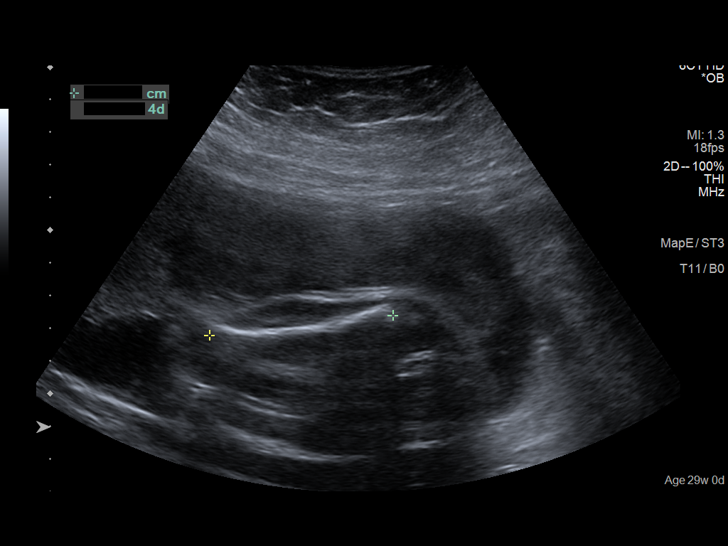
[im 33/41]
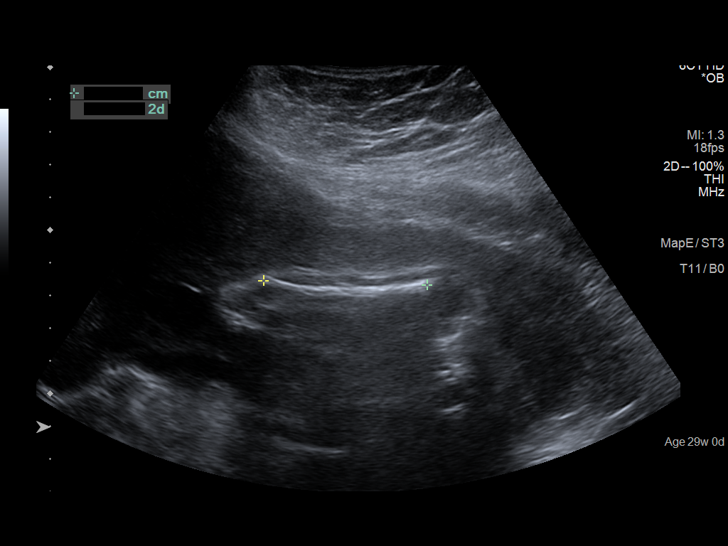
[im 36/41]
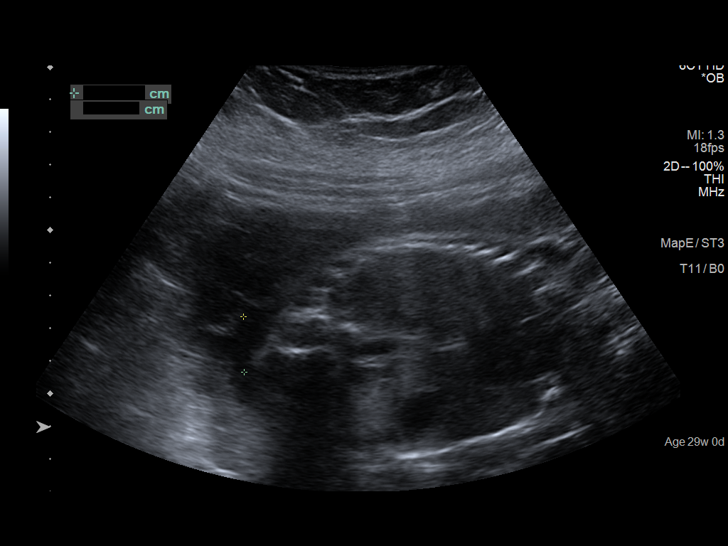
[im 39/41]
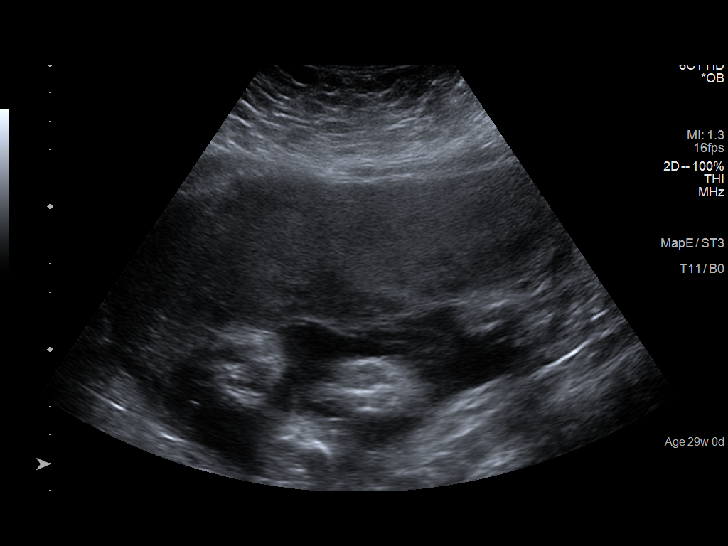

[13 of 28 positions shown; findings below may reference images not displayed]

FINDINGS: Number of Fetuses: 1

Heart Rate:  139 bpm

Movement: Present

Presentation: Cephalic

Previa: None

Placental Location: Anterior

Amniotic Fluid (Subjective): Normal

Amniotic Fluid (Objective): 11.1 cm (At 29 weeks, normal range is
6.5 -20.15 centimeters)

Vertical pocket 4.1cm

(At 29 weeks, normal range is 6.5 -20.15 centimeters)

FETAL BIOMETRY

BPD:  7.23cm 29w 0d

HC:    27.12cm 29w 4d

AC:    24.49cm 28w 5d

FL:    5.53cm 29w 1d

Current Mean GA: 29w 1d US EDC: 03/05/2021

Assigned GA: 29w 0d Assigned EDC: 03/06/2021

Estimated Fetal Weight:  1315g 36 percentile

Estimated fetal weight on previous ultrasound: 763.4 grams,
percentile

FETAL ANATOMY

Lateral Ventricles: Appears normal

Thalami/CSP: Appears normal

Posterior Fossa: Previously seen

Nuchal Region: Previously seen

Upper Lip: Previously seen

Spine: Previously seen

4 Chamber Heart on Left: Appears normal

LVOT: Previously seen

RVOT: Previously seen

Stomach on Left: Appears normal

3 Vessel Cord: Previously seen

Cord Insertion site: Previously seen

Kidneys: Appears normal

Bladder: Appears normal

Extremities: Previously seen

Technical Limitations: Maternal body habitus

Maternal Findings:

Cervix:  3.9 centimeters on transabdominal evaluation
IMPRESSION: 1. Single living intrauterine fetus in cephalic presentation.
2. Size and dates correlate well.
3. No fetal anomalies identified.

## 2023-03-13 IMAGING — US US FETAL BPP W/O NONSTRESS
1 series · 14 of 25 positions shown · non-contrast
Comparison: none

CLINICAL DATA: Preeclampsia, third trimester.

EXAM:
BIOPHYSICAL PROFILE

[Series 1: us fetal bpp wo non stress · 25 acquisitions, 14 frames shown]
[im 1/25]
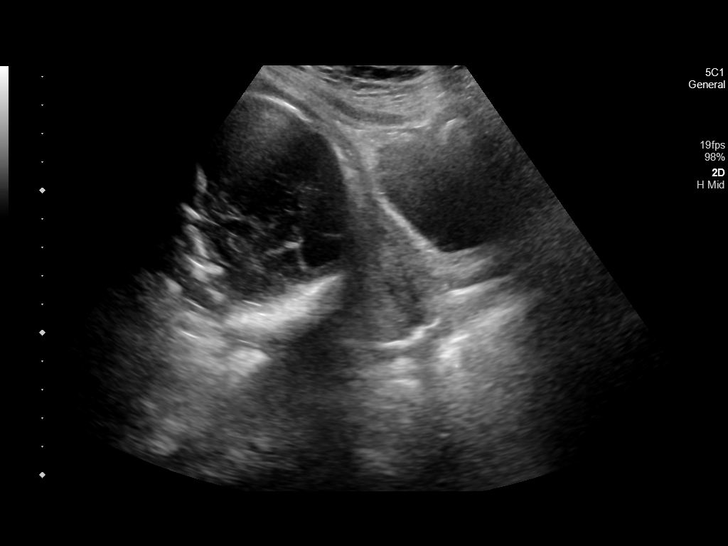
[im 3/25]
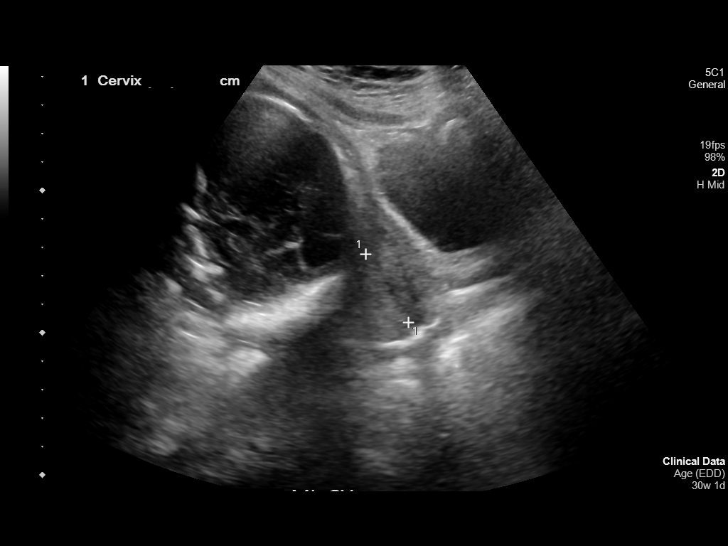
[im 5/25]
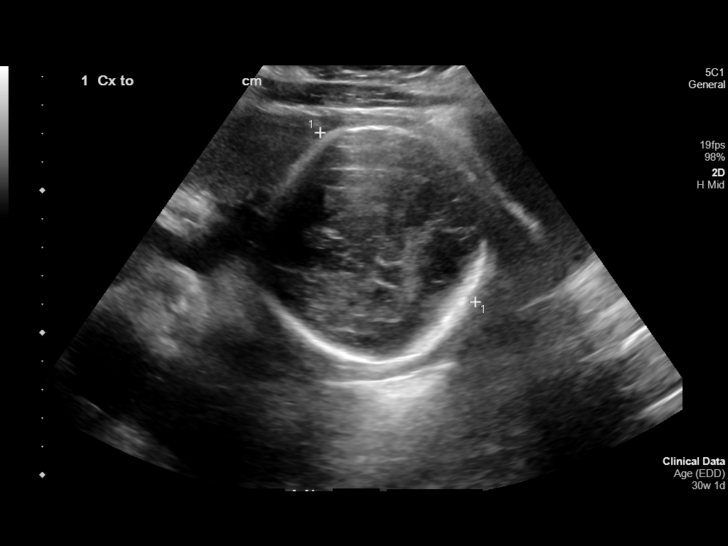
[im 7/25]
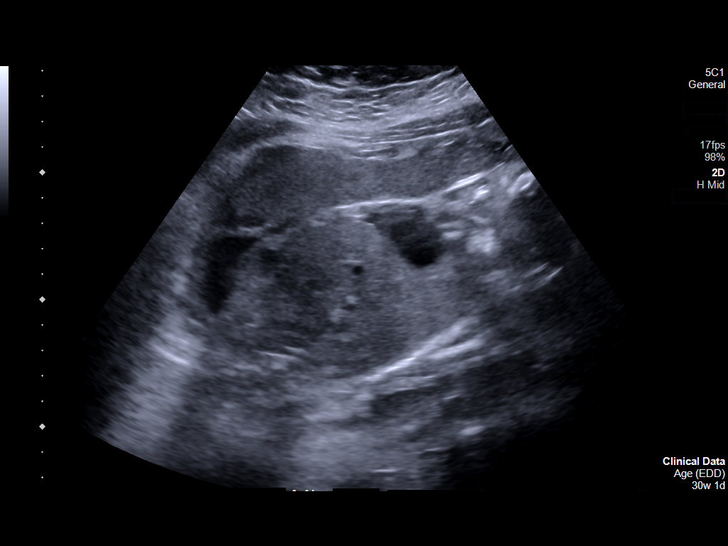
[im 9/25]
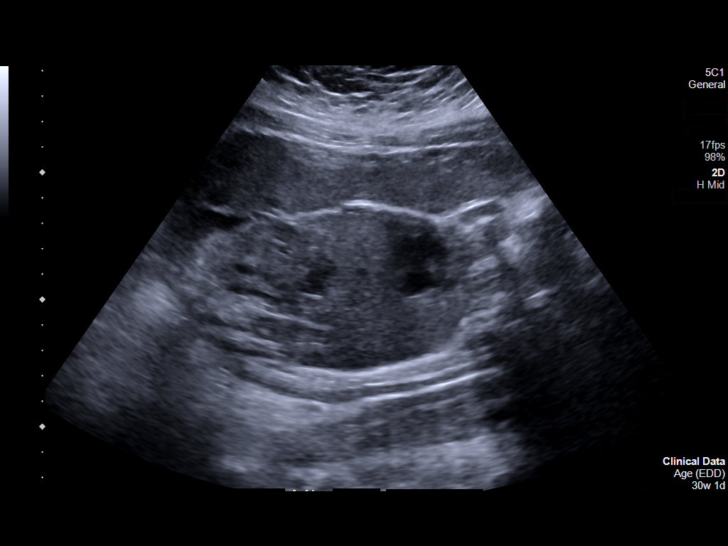
[im 10/25]
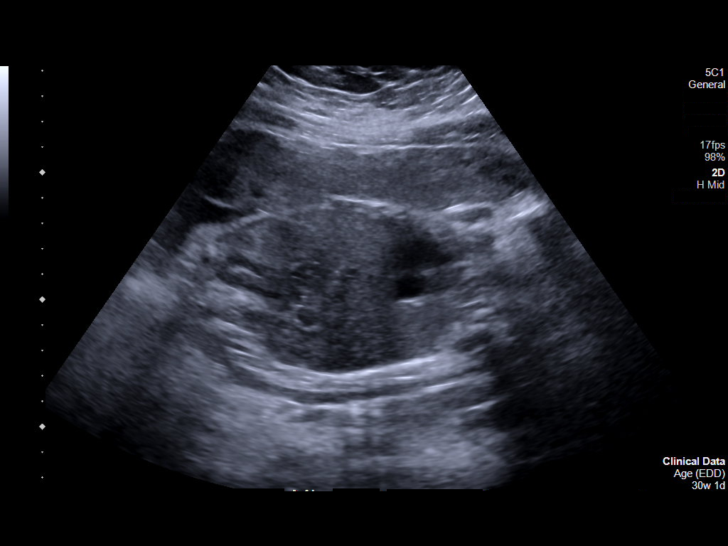
[im 12/25]
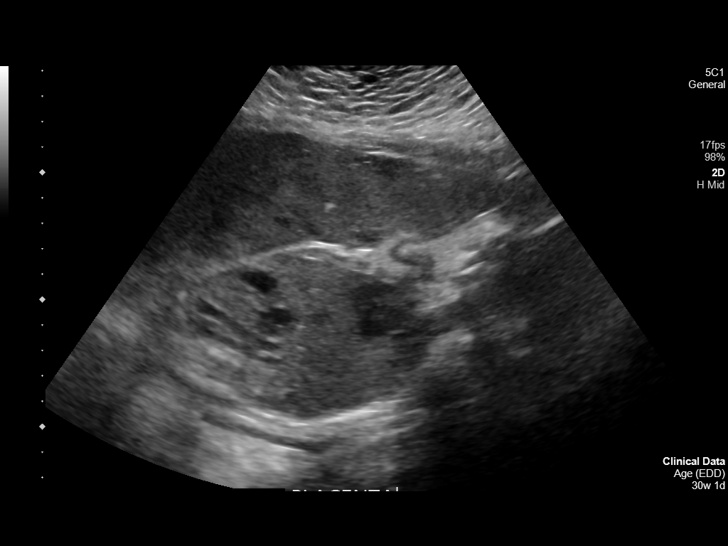
[im 14/25]
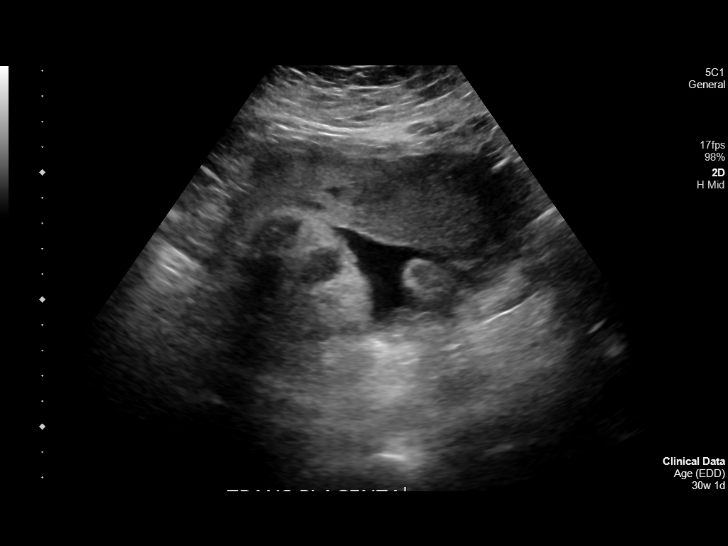
[im 16/25]
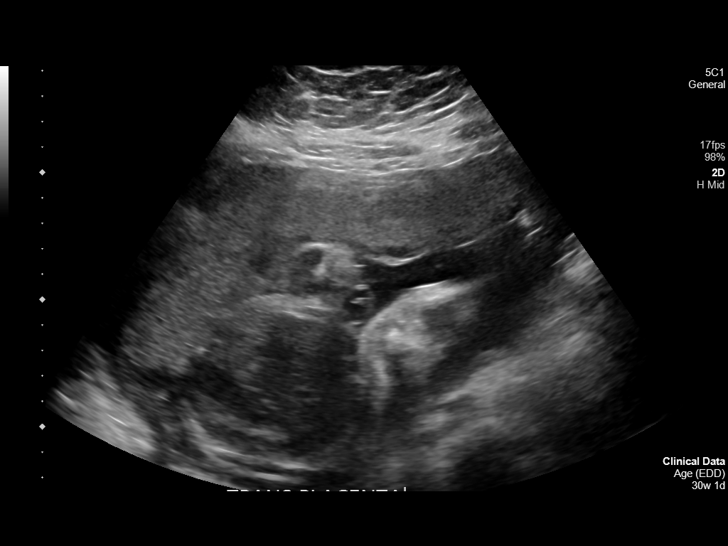
[im 17/25]
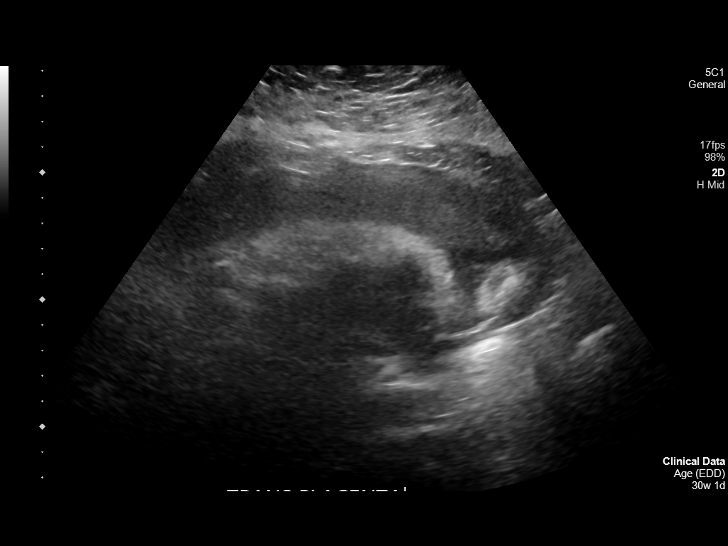
[im 19/25]
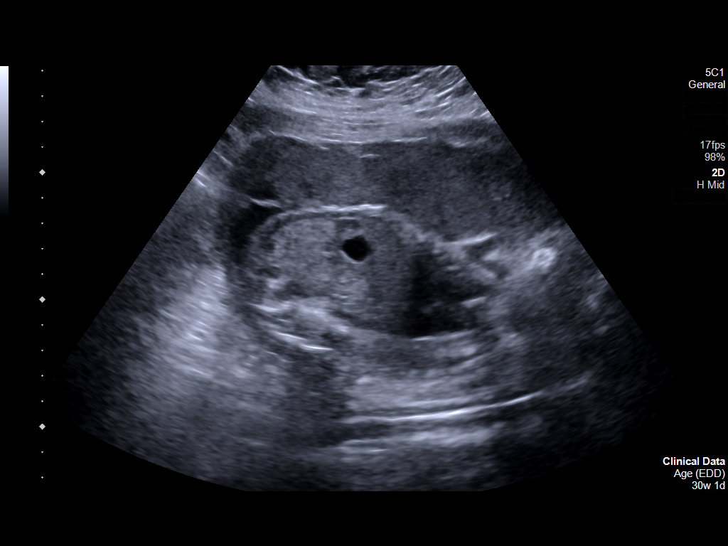
[im 21/25]
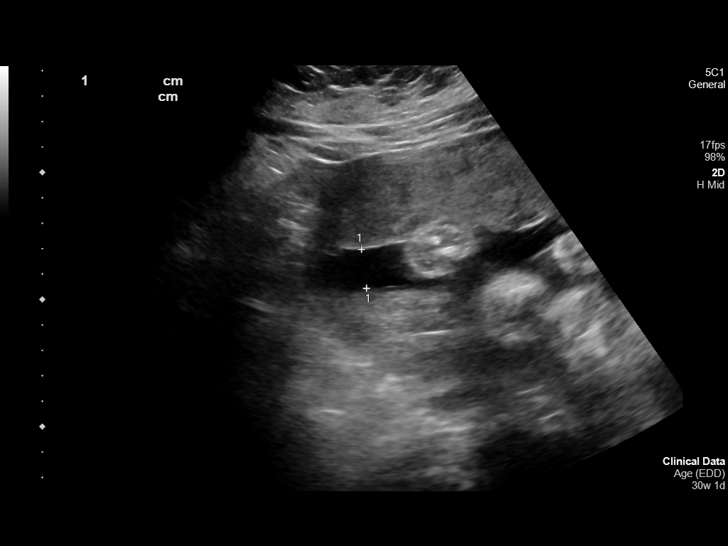
[im 23/25]
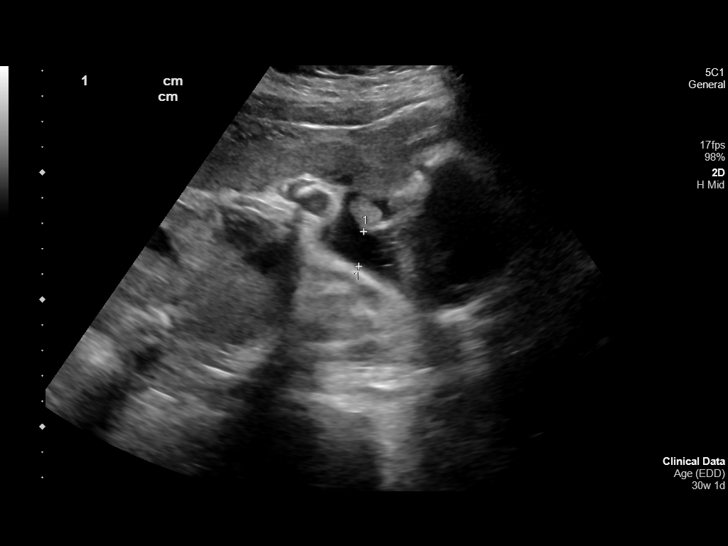
[im 25/25]
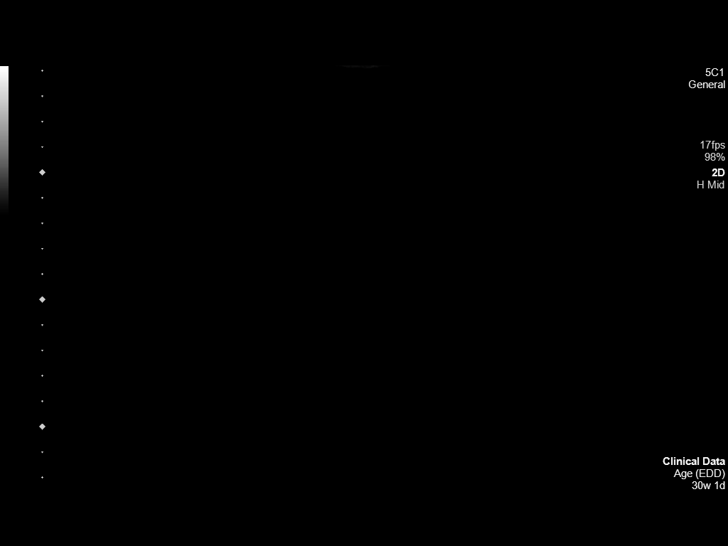

[14 of 25 positions shown; findings below may reference images not displayed]

FINDINGS: Number of Fetuses: 1

Heart Rate: 145 bpm

Presentation: Cephalic

Previa: No

BPD: 7.7 cm        31 weeks, 0 days

Movement: 2 time: 15 minutes

Breathing: 2

Tone: 2

Amniotic Fluid: 0 (5.3 cm) 23.4 ninety-fifth percentile for 30
weeks.

Total Score: 6
IMPRESSION: 1. Single, viable intrauterine pregnancy, at 31 weeks and 0 days
gestation by ultrasound evaluation.
2. Biophysical profile score of [DATE].

## 2023-03-14 DIAGNOSIS — Z419 Encounter for procedure for purposes other than remedying health state, unspecified: Secondary | ICD-10-CM | POA: Diagnosis not present

## 2023-03-14 NOTE — Telephone Encounter (Signed)
Letter has been sent

## 2023-03-19 IMAGING — US US FETAL BPP W/O NONSTRESS
1 series · 10 of 10 positions shown · non-contrast
Comparison: none

CLINICAL DATA: Preeclampsia in 3rd trimester pregnancy.

EXAM:
BIOPHYSICAL PROFILE

[Series 1: us fetal bpp wo nonstress (hospital) · 10 acquisitions, 10 frames shown]
[im 1/10]
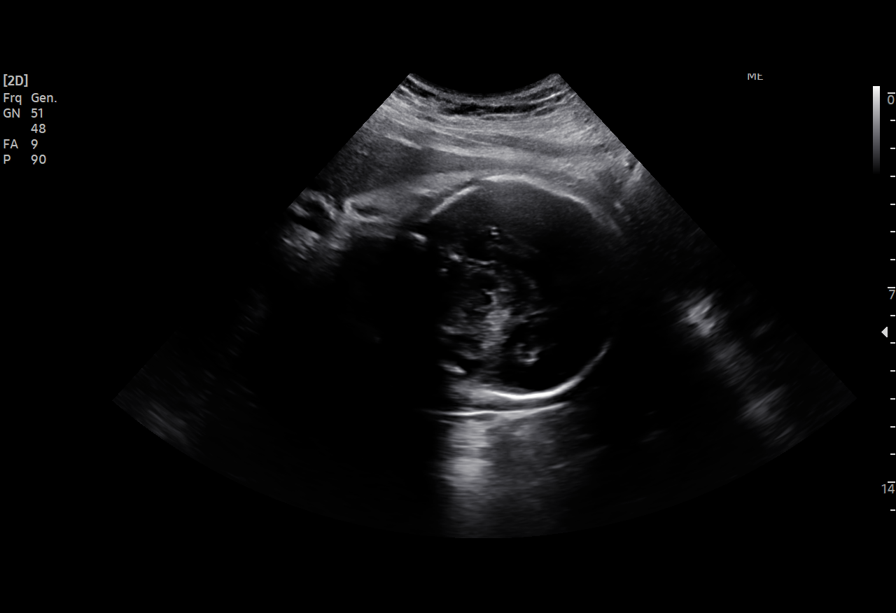
[im 2/10]
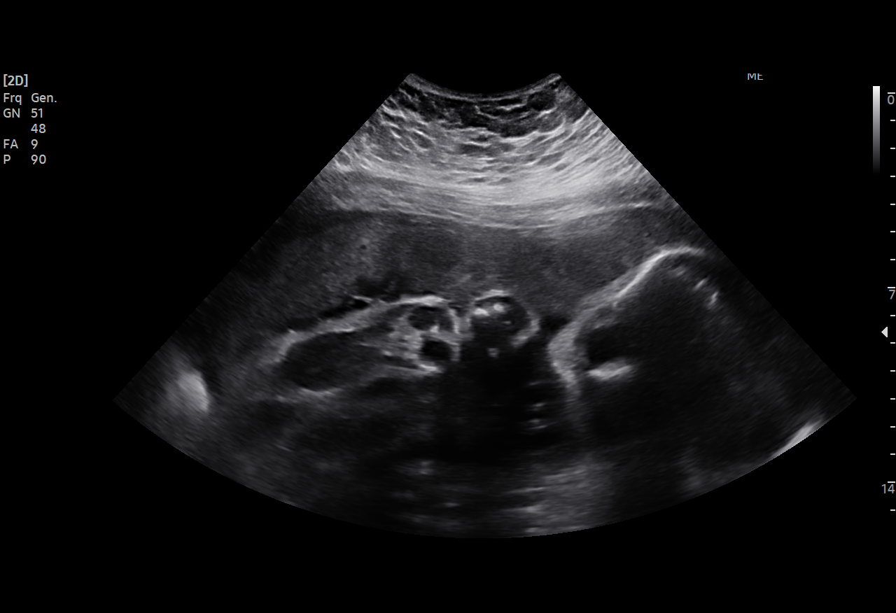
[im 3/10]
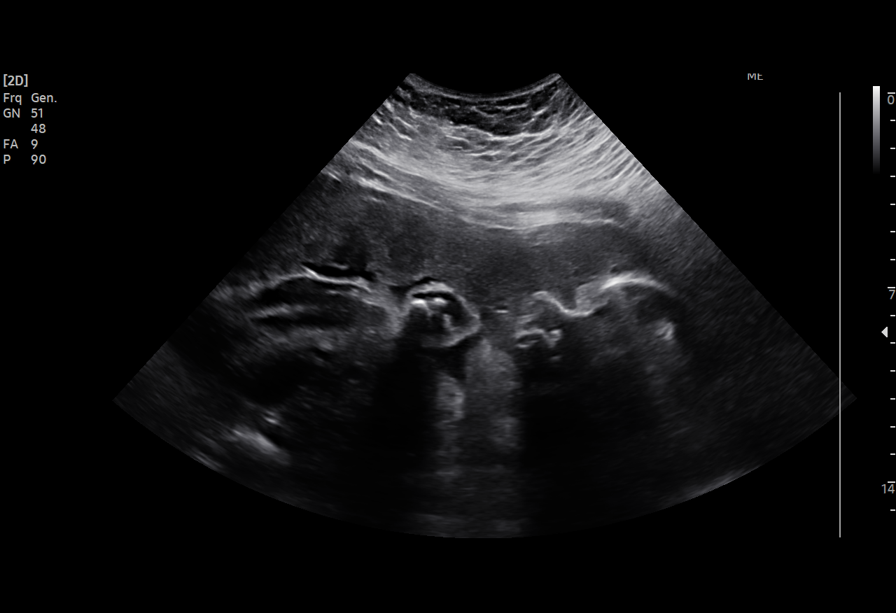
[im 4/10]
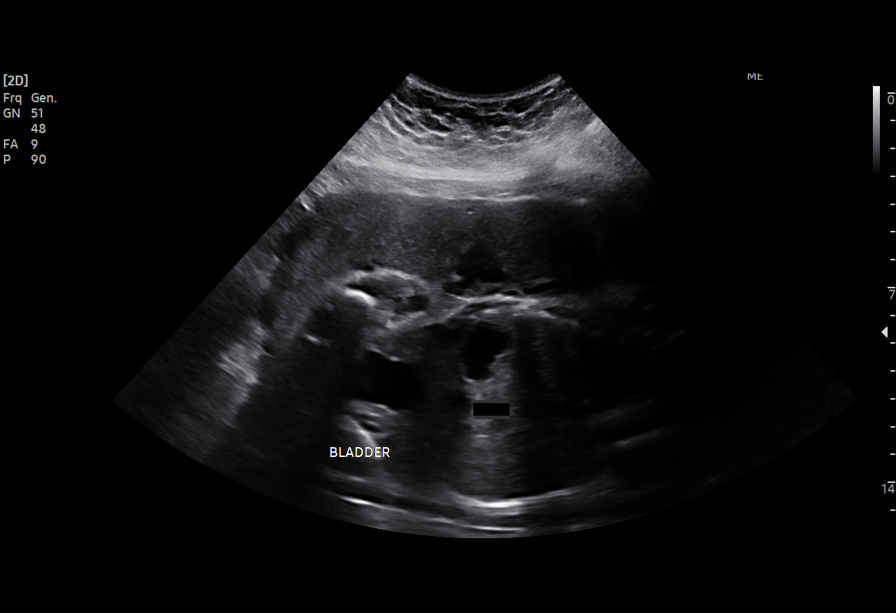
[im 5/10]
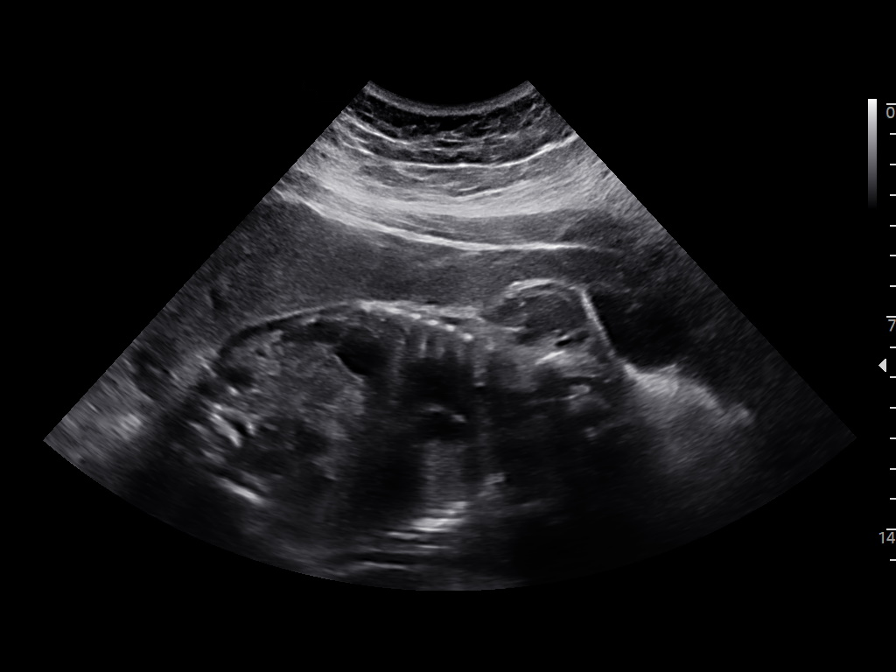
[im 6/10]
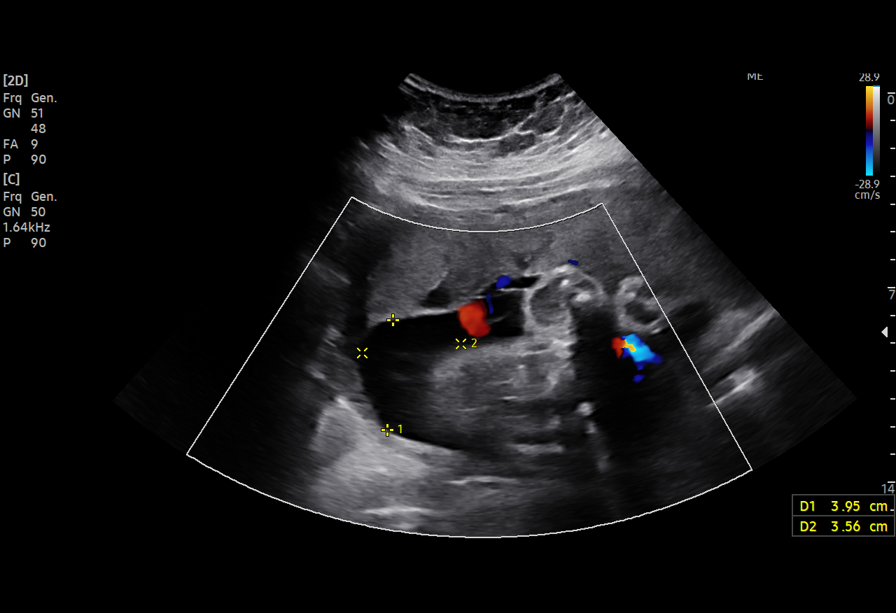
[im 7/10]
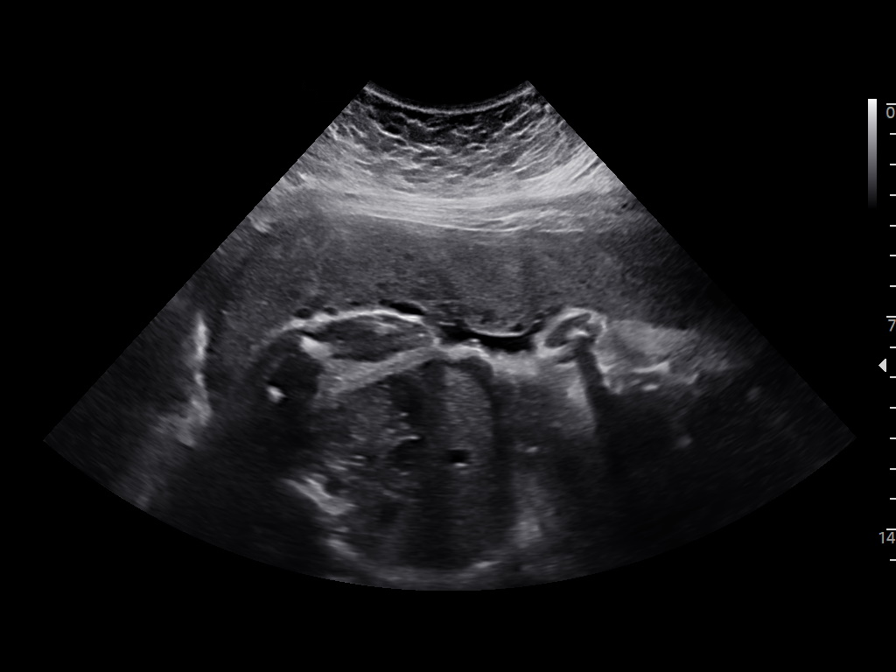
[im 8/10]
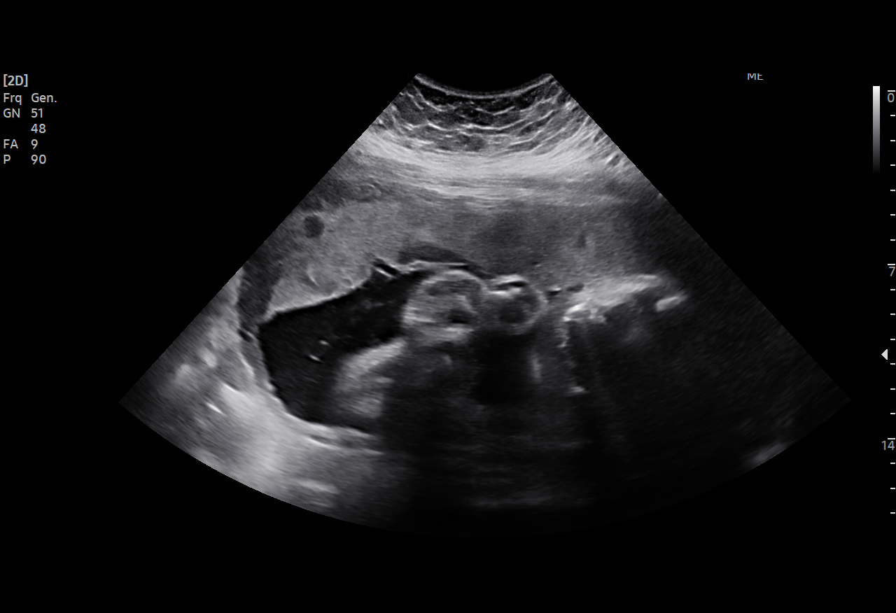
[im 9/10]
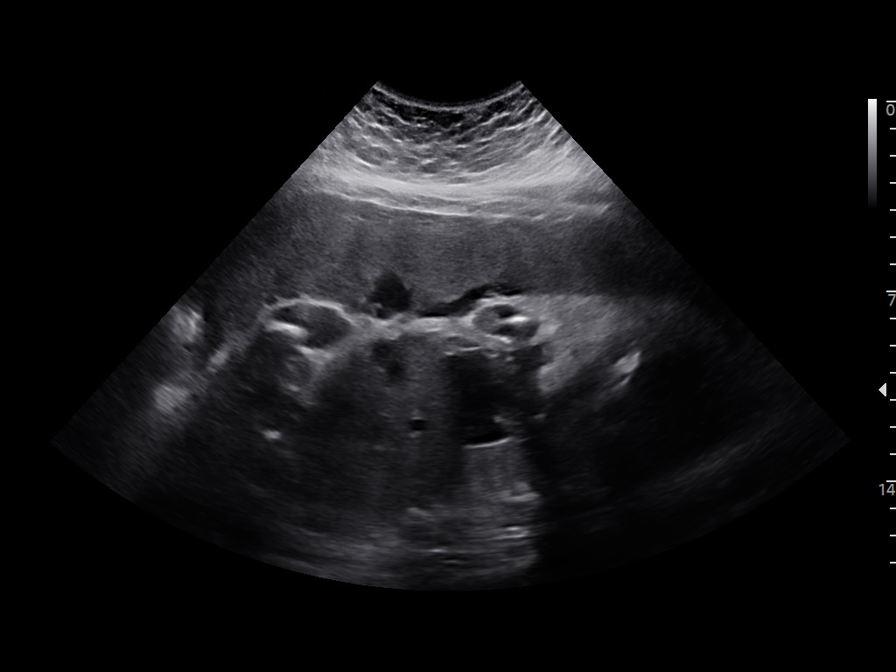
[im 10/10]
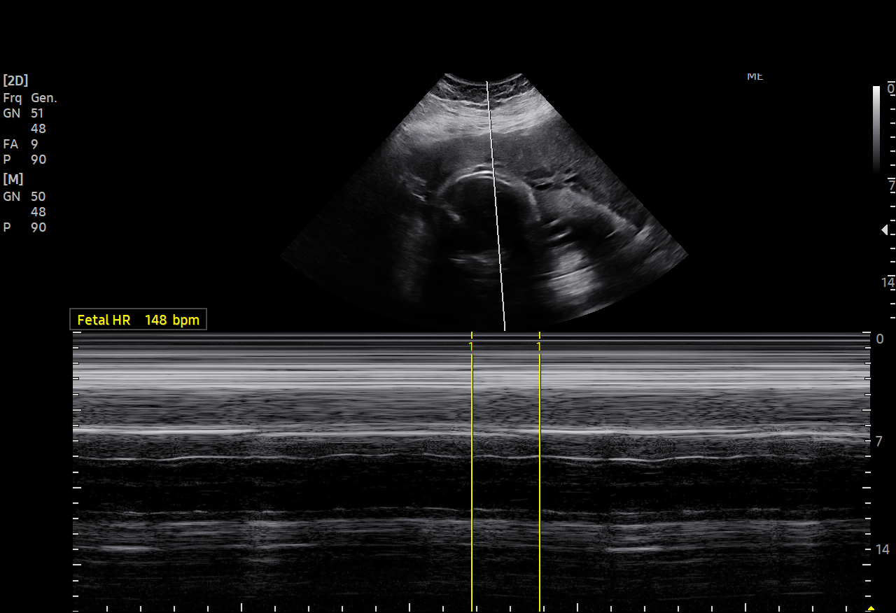

[10 of 10 positions shown; findings below may reference images not displayed]

FINDINGS: Number of Fetuses: 1

Heart Rate: 148 bpm

Presentation: Cephalic

Movement: 2 time: 7 minutes

Breathing: 2

Tone: 2

Amniotic Fluid: 2

Total Score: 8
IMPRESSION: Biophysical profile score of [DATE].

## 2023-03-25 IMAGING — US US FETAL BPP W/O NONSTRESS
1 series · 14 of 21 positions shown · non-contrast
Comparison: none

CLINICAL DATA: Preeclampsia

EXAM:
BIOPHYSICAL PROFILE

[Series 1: us fetal bpp wo non stress · 21 acquisitions, 14 frames shown]
[im 1/21]
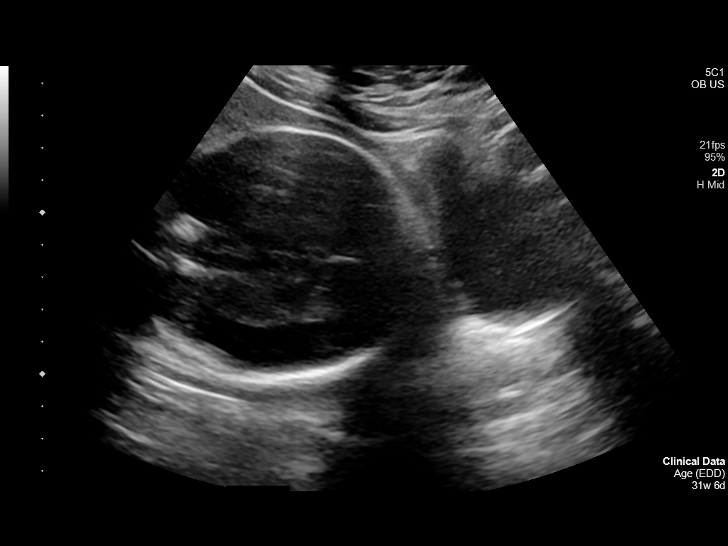
[im 3/21]
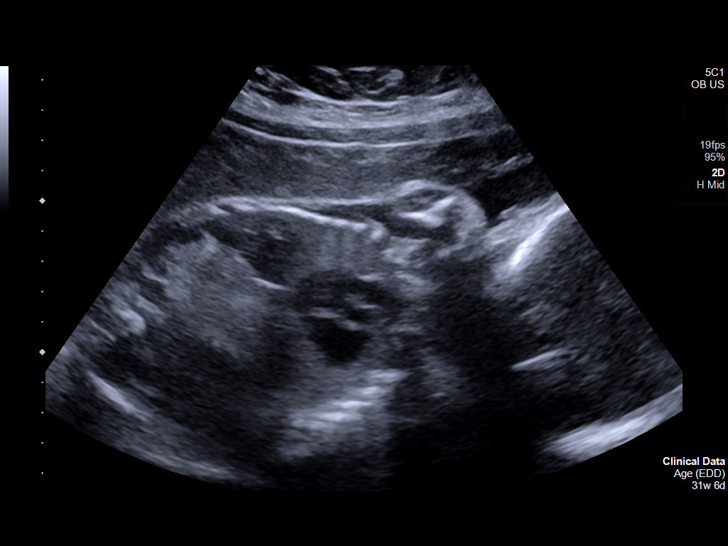
[im 4/21]
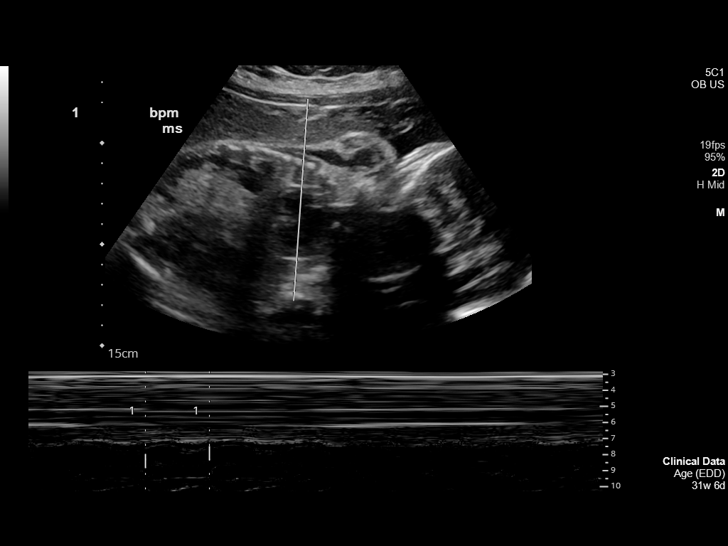
[im 6/21]
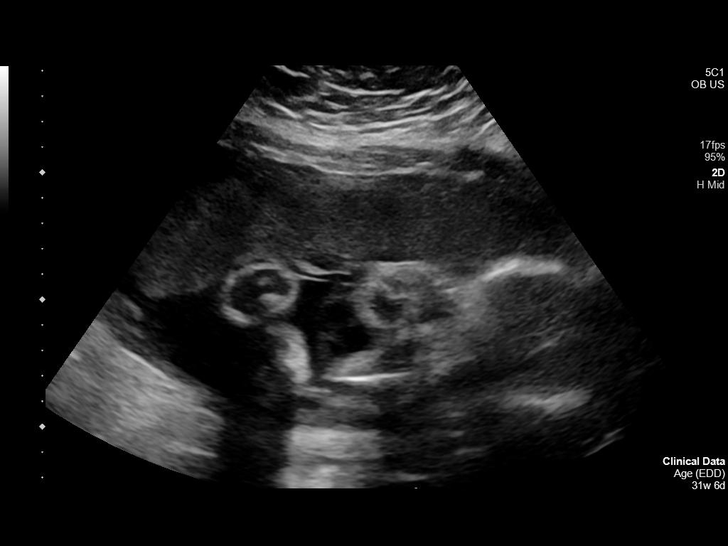
[im 7/21]
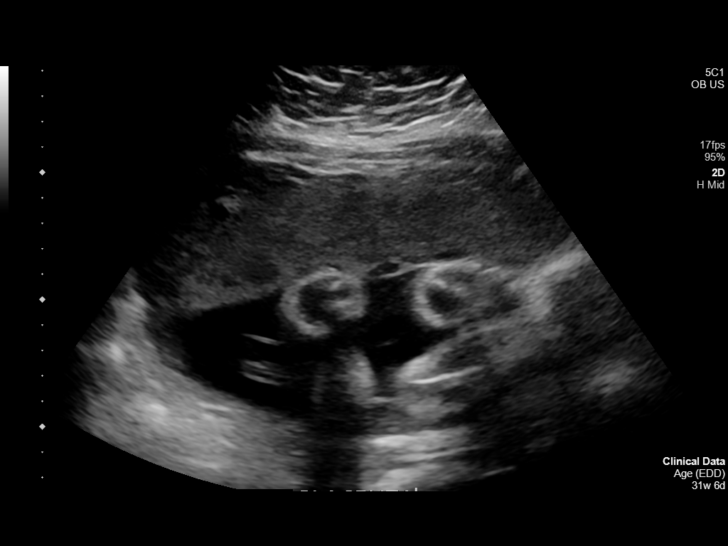
[im 9/21]
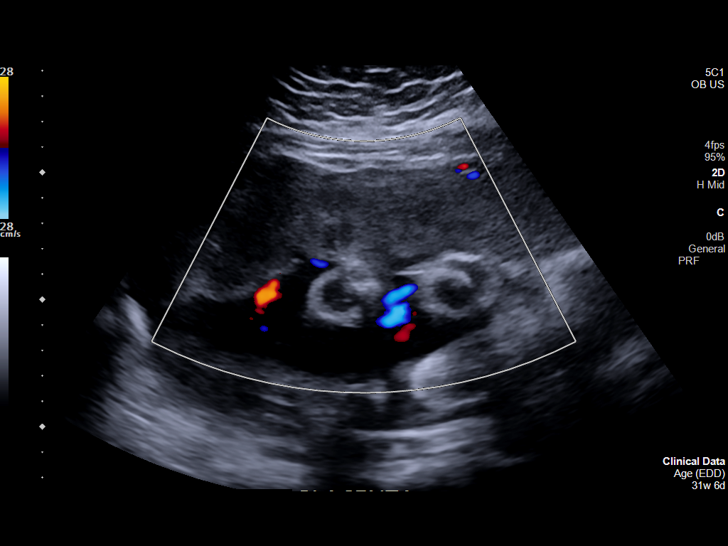
[im 10/21]
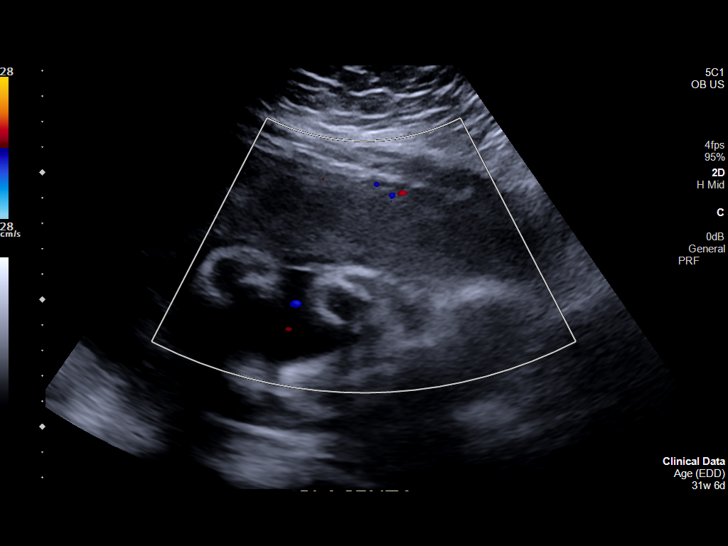
[im 12/21]
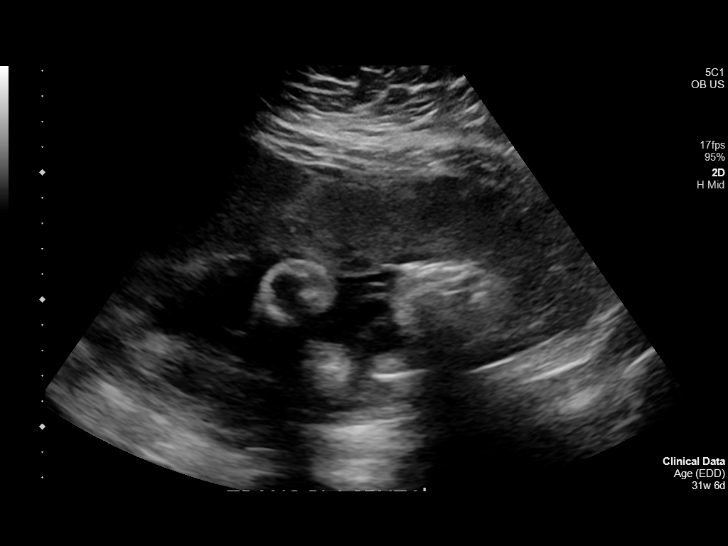
[im 13/21]
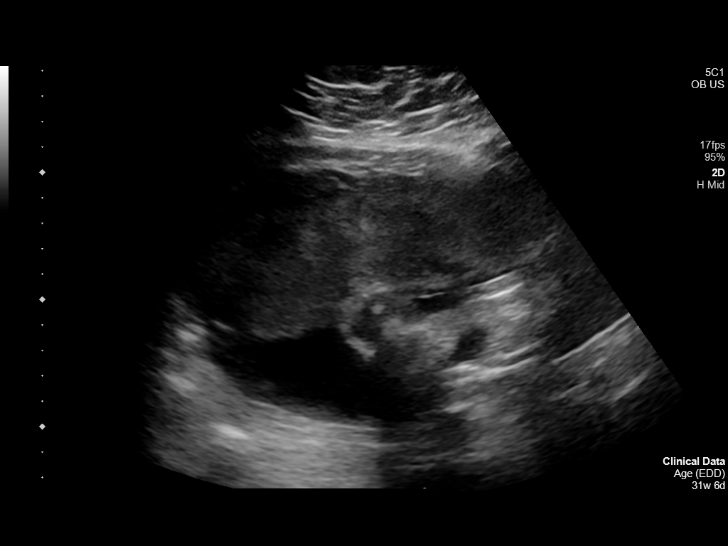
[im 15/21]
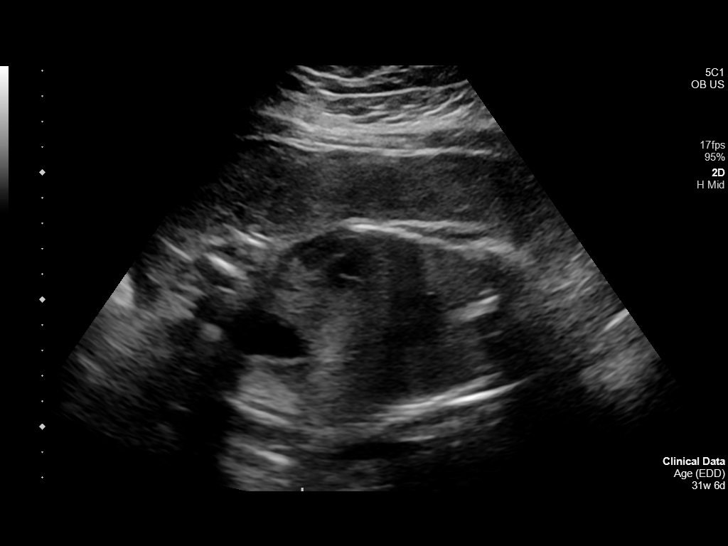
[im 16/21]
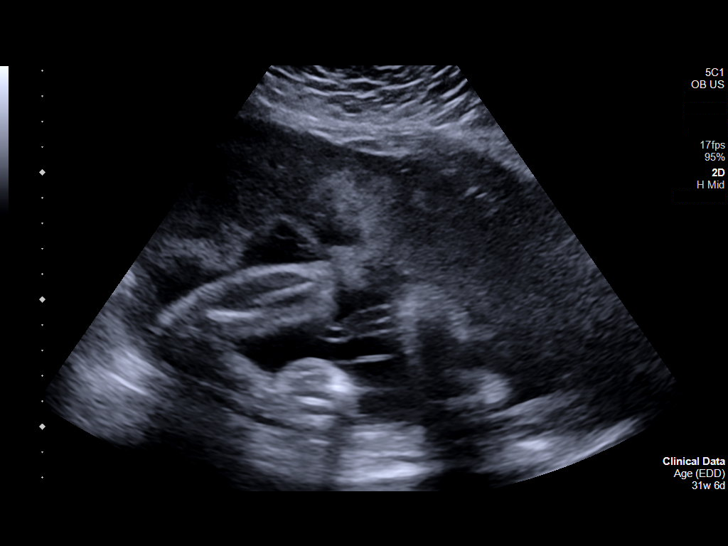
[im 18/21]
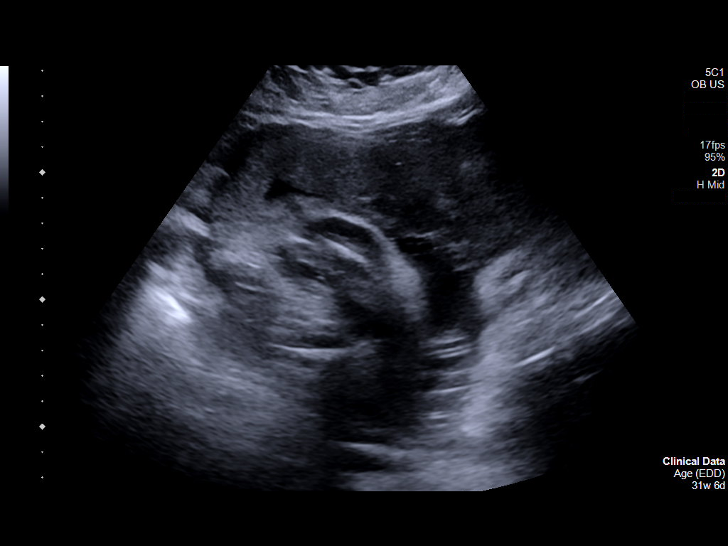
[im 19/21]
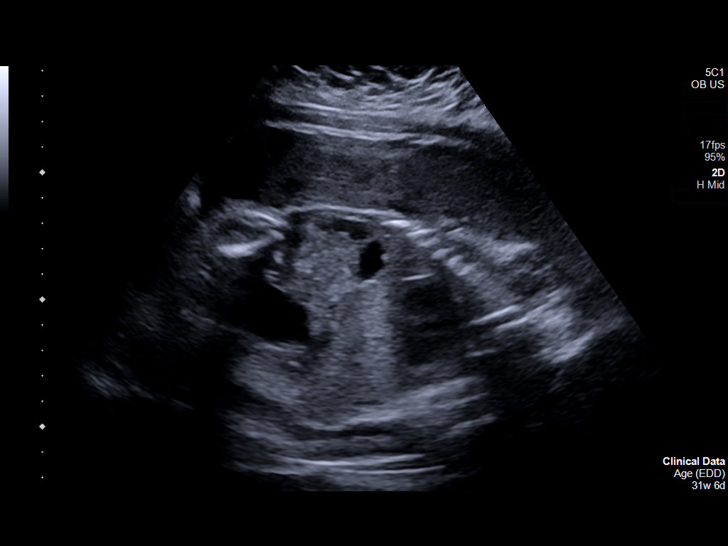
[im 21/21]
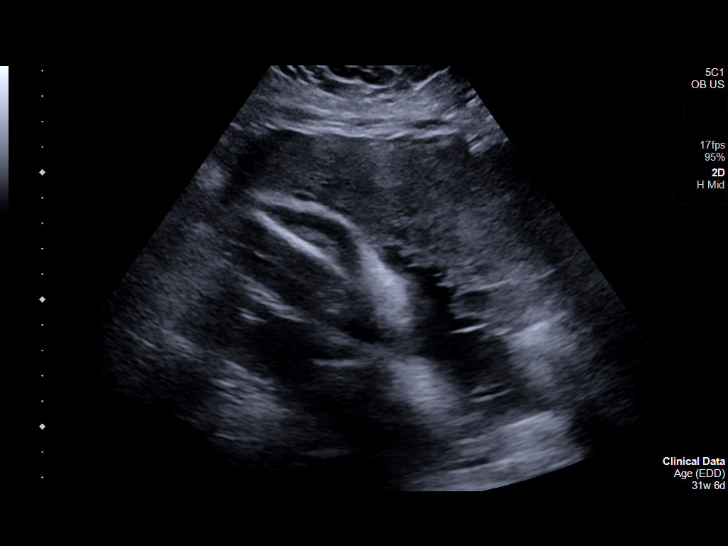

[14 of 21 positions shown; findings below may reference images not displayed]

FINDINGS: Number of Fetuses: 1

Heart Rate: 160 bpm

Presentation: The cephalic

Movement: 2      Time: 14 minutes

Breathing: 2

Tone: 2

Amniotic Fluid: 2

Total Score: 8
IMPRESSION: Biophysical profile score of [DATE].

## 2023-04-05 ENCOUNTER — Encounter: Payer: Self-pay | Admitting: Nurse Practitioner

## 2023-04-05 ENCOUNTER — Other Ambulatory Visit: Payer: Self-pay

## 2023-04-05 ENCOUNTER — Ambulatory Visit (INDEPENDENT_AMBULATORY_CARE_PROVIDER_SITE_OTHER): Payer: Medicaid Other | Admitting: Nurse Practitioner

## 2023-04-05 VITALS — BP 124/76 | HR 86 | Temp 97.9°F | Resp 18 | Ht 62.0 in | Wt 208.7 lb

## 2023-04-05 DIAGNOSIS — E782 Mixed hyperlipidemia: Secondary | ICD-10-CM

## 2023-04-05 DIAGNOSIS — F331 Major depressive disorder, recurrent, moderate: Secondary | ICD-10-CM

## 2023-04-05 DIAGNOSIS — F411 Generalized anxiety disorder: Secondary | ICD-10-CM

## 2023-04-05 DIAGNOSIS — Z6841 Body Mass Index (BMI) 40.0 and over, adult: Secondary | ICD-10-CM

## 2023-04-05 DIAGNOSIS — R112 Nausea with vomiting, unspecified: Secondary | ICD-10-CM | POA: Diagnosis not present

## 2023-04-05 DIAGNOSIS — I10 Essential (primary) hypertension: Secondary | ICD-10-CM

## 2023-04-05 MED ORDER — ONDANSETRON 4 MG PO TBDP
4.0000 mg | ORAL_TABLET | Freq: Three times a day (TID) | ORAL | 0 refills | Status: DC | PRN
Start: 2023-04-05 — End: 2023-05-23

## 2023-04-05 NOTE — Progress Notes (Signed)
BP 124/76   Pulse 86   Temp 97.9 F (36.6 C) (Oral)   Resp 18   Ht 5\' 2"  (1.575 m)   Wt 208 lb 11.2 oz (94.7 kg)   SpO2 98%   BMI 38.17 kg/m    Subjective:    Patient ID: Kristina Mccann, female    DOB: July 01, 1991, 32 y.o.   MRN: [id number]  HPI: Kristina Mccann is a 32 y.o. female  Chief Complaint  Patient presents with   Hypertension   Depression   Obesity    Follow up   Hypertension:  -Medications:  nifedipine 60  mg daily.   -Patient is compliant with above medications and reports no side effects. -Checking BP at home (average): 120s -Denies any SOB, CP, vision changes, LE edema or symptoms of hypotension -Diet: recommend DASH diet  -Exercise: recommend 150 min of physical activity weekly        04/05/2023    3:06 PM 02/25/2023    8:10 AM 01/24/2023    9:29 AM  Vitals with BMI  Height 5\' 2"  5\' 2"    Weight 208 lbs 11 oz 214 lbs   BMI 38.16 39.13   Systolic 124 126 409  Diastolic 76 82 90  Pulse 86 84      HLD:  -Medications: none -Patient is working on lifestyle modification -Last lipid panel:    Lipid Panel     Component Value Date/Time   CHOL 170 01/24/2023 0946   TRIG 86 01/24/2023 0946   HDL 53 01/24/2023 0946   CHOLHDL 3.2 01/24/2023 0946   LDLCALC 99 01/24/2023 0946     Obesity:  Current weight : 208 lbs BMI: 38.17 previous weight:214 lbs Treatment Tried: contrave( she was unable to get prescription), lifestyle modification Comorbidities: HTN, HLD   Depression/anxiety Medication prescribed zoloft 25 mg daily, she has not been able to get prescription, she says the pharmacy told her it was a problem with her insurance.  Contacting pharmacy to see what is going on. [facility] reported that patient did not pick up prescription. Pharmacist reports that they will fill it. Informed patient and she is going to go pick it up today and start it.   Compliant no  Side effects n/a PHQ9 positive GAD positive Patient reports she has not had any  motivation to do anything. She says she is struggling with deciding if she is going to work or stay at home.  She reports she is feeling apathetic.     04/05/2023    3:11 PM 02/25/2023    8:14 AM 01/24/2023    9:12 AM 01/03/2023    2:40 PM 09/18/2022    1:21 PM  Depression screen PHQ 2/9  Decreased Interest 1 1 1 1 3   Down, Depressed, Hopeless 1 1 1 1 3   PHQ - 2 Score 2 2 2 2 6   Altered sleeping 1 1 1 2 3   Tired, decreased energy 1 1 1 2 2   Change in appetite 3 1 1 2 3   Feeling bad or failure about yourself  3 1 1 1 2   Trouble concentrating 1 1 1 1 3   Moving slowly or fidgety/restless 1 1 1 1 3   Suicidal thoughts 0 0 0 0 0  PHQ-9 Score 12 8 8 11 22   Difficult doing work/chores Somewhat difficult Somewhat difficult Somewhat difficult Somewhat difficult Extremely dIfficult       04/05/2023    3:11 PM 02/25/2023    8:14 AM 01/24/2023  9:13 AM 01/03/2023    2:40 PM  GAD 7 : Generalized Anxiety Score  Nervous, Anxious, on Edge 1 1 1 1   Control/stop worrying 1 1 1 1   Worry too much - different things 1 1 1 1   Trouble relaxing 3 1 1 1   Restless 3 1 1 1   Easily annoyed or irritable 3 1 1 2   Afraid - awful might happen 1 1 1  0  Total GAD 7 Score 13 7 7 7   Anxiety Difficulty Somewhat difficult Somewhat difficult Somewhat difficult Somewhat difficult    Nausea: she says she has been very stressed and it has really affected her appetite. She says she has had nausea off and on.  Will give zofran.  Relevant past medical, surgical, family and social history reviewed and updated as indicated. Interim medical history since our last visit reviewed. Allergies and medications reviewed and updated.  Review of Systems  Constitutional: Negative for fever or weight change.  Respiratory: Negative for cough and shortness of breath.   Cardiovascular: Negative for chest pain or palpitations.  Gastrointestinal: Negative for abdominal pain, no bowel changes.  Musculoskeletal: Negative for gait problem or  joint swelling.  Skin: Negative for rash.  Neurological: Negative for dizziness or headache.  No other specific complaints in a complete review of systems (except as listed in HPI above).      Objective:    BP 124/76   Pulse 86   Temp 97.9 F (36.6 C) (Oral)   Resp 18   Ht 5\' 2"  (1.575 m)   Wt 208 lb 11.2 oz (94.7 kg)   SpO2 98%   BMI 38.17 kg/m   Wt Readings from Last 3 Encounters:  04/05/23 208 lb 11.2 oz (94.7 kg)  02/25/23 214 lb (97.1 kg)  01/24/23 214 lb 1.6 oz (97.1 kg)    Physical Exam  Constitutional: Patient appears well-developed and well-nourished. Obese  No distress.  HEENT: head atraumatic, normocephalic, pupils equal and reactive to light, neck supple, throat within normal limits Cardiovascular: Normal rate, regular rhythm and normal heart sounds.  No murmur heard. No BLE edema. Pulmonary/Chest: Effort normal and breath sounds normal. No respiratory distress. Abdominal: Soft.  There is no tenderness. Psychiatric: Patient has a normal mood and affect. behavior is normal. Judgment and thought content normal.   Results for orders placed or performed in visit on 01/24/23  CBC with Differential/Platelet  Result Value Ref Range   WBC 7.5 3.8 - 10.8 Thousand/uL   RBC 4.25 3.80 - 5.10 Million/uL   Hemoglobin 12.1 11.7 - 15.5 g/dL   HCT 16.1 09.6 - 04.5 %   MCV 86.8 80.0 - 100.0 fL   MCH 28.5 27.0 - 33.0 pg   MCHC 32.8 32.0 - 36.0 g/dL   RDW 40.9 81.1 - 91.4 %   Platelets 420 (H) 140 - 400 Thousand/uL   MPV 11.0 7.5 - 12.5 fL   Neutro Abs 4,523 1,500 - 7,800 cells/uL   Lymphs Abs 2,273 850 - 3,900 cells/uL   Absolute Monocytes 443 200 - 950 cells/uL   Eosinophils Absolute 240 15 - 500 cells/uL   Basophils Absolute 23 0 - 200 cells/uL   Neutrophils Relative % 60.3 %   Total Lymphocyte 30.3 %   Monocytes Relative 5.9 %   Eosinophils Relative 3.2 %   Basophils Relative 0.3 %  COMPLETE METABOLIC PANEL WITH GFR  Result Value Ref Range   Glucose, Bld 96 65 -  99 mg/dL   BUN 10 7 -  25 mg/dL   Creat 4.13 2.44 - 0.10 mg/dL   eGFR 95 > OR = 60 UV/OZD/6.64Q0   BUN/Creatinine Ratio SEE NOTE: 6 - 22 (calc)   Sodium 138 135 - 146 mmol/L   Potassium 4.5 3.5 - 5.3 mmol/L   Chloride 103 98 - 110 mmol/L   CO2 28 20 - 32 mmol/L   Calcium 9.5 8.6 - 10.2 mg/dL   Total Protein 7.4 6.1 - 8.1 g/dL   Albumin 3.9 3.6 - 5.1 g/dL   Globulin 3.5 1.9 - 3.7 g/dL (calc)   AG Ratio 1.1 1.0 - 2.5 (calc)   Total Bilirubin 0.2 0.2 - 1.2 mg/dL   Alkaline phosphatase (APISO) 89 31 - 125 U/L   AST 24 10 - 30 U/L   ALT 15 6 - 29 U/L  Lipid panel  Result Value Ref Range   Cholesterol 170 <200 mg/dL   HDL 53 > OR = 50 mg/dL   Triglycerides 86 <347 mg/dL   LDL Cholesterol (Calc) 99 mg/dL (calc)   Total CHOL/HDL Ratio 3.2 <5.0 (calc)   Non-HDL Cholesterol (Calc) 117 <130 mg/dL (calc)  Hemoglobin Q2V  Result Value Ref Range   Hgb A1c MFr Bld 5.8 (H) <5.7 % of total Hgb   Mean Plasma Glucose 120 mg/dL   eAG (mmol/L) 6.6 mmol/L      Assessment & Plan:   Problem List Items Addressed This Visit       Cardiovascular and Mediastinum   Essential hypertension - Primary    Continue nifedipine 60 mg daily.         Other   Class 3 severe obesity due to excess calories with serious comorbidity and body mass index (BMI) of 40.0 to 44.9 in adult [facility])    Unable to afford contrave, she is working on lifestyle modification.       GAD (generalized anxiety disorder)    Patient was prescribed zoloft 25 mg at last appointment. Patient reported she was unable to get the medication. Called pharmacy and they will get it ready for her. Patient is going to go pick it up today.  Follow up in 4 weeks.       Hyperlipidemia   Moderate episode of recurrent major depressive disorder [facility])    Patient was prescribed zoloft 25 mg at last appointment. Patient reported she was unable to get the medication. Called pharmacy and they will get it ready for her. Patient is going to go pick it up  today.  Follow up in 4 weeks.       Other Visit Diagnoses     Nausea and vomiting, unspecified vomiting type       patient reports she has been really stressed and it causing her to have nausea.  would like refill on zofran.   Relevant Medications   ondansetron (ZOFRAN-ODT) 4 MG disintegrating tablet         Follow up plan: Return in about 4 weeks (around 05/03/2023) for follow up.

## 2023-04-05 NOTE — Assessment & Plan Note (Signed)
Unable to afford contrave, she is working on lifestyle modification.

## 2023-04-05 NOTE — Assessment & Plan Note (Signed)
Continue nifedipine 60mg  daily

## 2023-04-05 NOTE — Assessment & Plan Note (Signed)
Patient was prescribed zoloft 25 mg at last appointment. Patient reported she was unable to get the medication. Called pharmacy and they will get it ready for her. Patient is going to go pick it up today.  Follow up in 4 weeks.

## 2023-04-09 ENCOUNTER — Encounter: Payer: Self-pay | Admitting: Nurse Practitioner

## 2023-04-14 DIAGNOSIS — Z419 Encounter for procedure for purposes other than remedying health state, unspecified: Secondary | ICD-10-CM | POA: Diagnosis not present

## 2023-05-02 NOTE — Progress Notes (Deleted)
There were no vitals taken for this visit.   Subjective:    Patient ID: Kristina Mccann, female    DOB: 1991-06-18, 32 y.o.   MRN: [id number]  HPI: Kristina Mccann is a 32 y.o. female  No chief complaint on file.  Depression/anxiety: patient has been struggling with depression and anxiety recently. She was prescribed zoloft a couple visits ago but did not start it, she reports she never heard from the pharmacy that the medication was ready.  Last office visit we called the pharmacy and verified they had the mediation ready for the patient.  Patient was to leave the office and go pick it up. Patient is here for 4 week follow up. Patient reports *** Medication zoloft 25 mg daily Compliant *** Side effects *** PHQ9 *** GAD ***     04/05/2023    3:11 PM 02/25/2023    8:14 AM 01/24/2023    9:12 AM 01/03/2023    2:40 PM 09/18/2022    1:21 PM  Depression screen PHQ 2/9  Decreased Interest 1 1 1 1 3   Down, Depressed, Hopeless 1 1 1 1 3   PHQ - 2 Score 2 2 2 2 6   Altered sleeping 1 1 1 2 3   Tired, decreased energy 1 1 1 2 2   Change in appetite 3 1 1 2 3   Feeling bad or failure about yourself  3 1 1 1 2   Trouble concentrating 1 1 1 1 3   Moving slowly or fidgety/restless 1 1 1 1 3   Suicidal thoughts 0 0 0 0 0  PHQ-9 Score 12 8 8 11 22   Difficult doing work/chores Somewhat difficult Somewhat difficult Somewhat difficult Somewhat difficult Extremely dIfficult       04/05/2023    3:11 PM 02/25/2023    8:14 AM 01/24/2023    9:13 AM 01/03/2023    2:40 PM  GAD 7 : Generalized Anxiety Score  Nervous, Anxious, on Edge 1 1 1 1   Control/stop worrying 1 1 1 1   Worry too much - different things 1 1 1 1   Trouble relaxing 3 1 1 1   Restless 3 1 1 1   Easily annoyed or irritable 3 1 1 2   Afraid - awful might happen 1 1 1  0  Total GAD 7 Score 13 7 7 7   Anxiety Difficulty Somewhat difficult Somewhat difficult Somewhat difficult Somewhat difficult    Relevant past medical, surgical, family and  social history reviewed and updated as indicated. Interim medical history since our last visit reviewed. Allergies and medications reviewed and updated.  Review of Systems  Constitutional: Negative for fever or weight change.  Respiratory: Negative for cough and shortness of breath.   Cardiovascular: Negative for chest pain or palpitations.  Gastrointestinal: Negative for abdominal pain, no bowel changes.  Musculoskeletal: Negative for gait problem or joint swelling.  Skin: Negative for rash.  Neurological: Negative for dizziness or headache.  No other specific complaints in a complete review of systems (except as listed in HPI above).      Objective:    There were no vitals taken for this visit.  Wt Readings from Last 3 Encounters:  04/05/23 208 lb 11.2 oz (94.7 kg)  02/25/23 214 lb (97.1 kg)  01/24/23 214 lb 1.6 oz (97.1 kg)    Physical Exam  Constitutional: Patient appears well-developed and well-nourished. Obese  No distress.  HEENT: head atraumatic, normocephalic, pupils equal and reactive to light, neck supple, throat within normal limits Cardiovascular: Normal rate, regular  rhythm and normal heart sounds.  No murmur heard. No BLE edema. Pulmonary/Chest: Effort normal and breath sounds normal. No respiratory distress. Abdominal: Soft.  There is no tenderness. Psychiatric: Patient has a normal mood and affect. behavior is normal. Judgment and thought content normal.   Results for orders placed or performed in visit on 01/24/23  CBC with Differential/Platelet  Result Value Ref Range   WBC 7.5 3.8 - 10.8 Thousand/uL   RBC 4.25 3.80 - 5.10 Million/uL   Hemoglobin 12.1 11.7 - 15.5 g/dL   HCT 78.2 95.6 - 21.3 %   MCV 86.8 80.0 - 100.0 fL   MCH 28.5 27.0 - 33.0 pg   MCHC 32.8 32.0 - 36.0 g/dL   RDW 08.6 57.8 - 46.9 %   Platelets 420 (H) 140 - 400 Thousand/uL   MPV 11.0 7.5 - 12.5 fL   Neutro Abs 4,523 1,500 - 7,800 cells/uL   Lymphs Abs 2,273 850 - 3,900 cells/uL    Absolute Monocytes 443 200 - 950 cells/uL   Eosinophils Absolute 240 15 - 500 cells/uL   Basophils Absolute 23 0 - 200 cells/uL   Neutrophils Relative % 60.3 %   Total Lymphocyte 30.3 %   Monocytes Relative 5.9 %   Eosinophils Relative 3.2 %   Basophils Relative 0.3 %  COMPLETE METABOLIC PANEL WITH GFR  Result Value Ref Range   Glucose, Bld 96 65 - 99 mg/dL   BUN 10 7 - 25 mg/dL   Creat 6.29 5.28 - 4.13 mg/dL   eGFR 95 > OR = 60 KG/MWN/0.27O5   BUN/Creatinine Ratio SEE NOTE: 6 - 22 (calc)   Sodium 138 135 - 146 mmol/L   Potassium 4.5 3.5 - 5.3 mmol/L   Chloride 103 98 - 110 mmol/L   CO2 28 20 - 32 mmol/L   Calcium 9.5 8.6 - 10.2 mg/dL   Total Protein 7.4 6.1 - 8.1 g/dL   Albumin 3.9 3.6 - 5.1 g/dL   Globulin 3.5 1.9 - 3.7 g/dL (calc)   AG Ratio 1.1 1.0 - 2.5 (calc)   Total Bilirubin 0.2 0.2 - 1.2 mg/dL   Alkaline phosphatase (APISO) 89 31 - 125 U/L   AST 24 10 - 30 U/L   ALT 15 6 - 29 U/L  Lipid panel  Result Value Ref Range   Cholesterol 170 <200 mg/dL   HDL 53 > OR = 50 mg/dL   Triglycerides 86 <366 mg/dL   LDL Cholesterol (Calc) 99 mg/dL (calc)   Total CHOL/HDL Ratio 3.2 <5.0 (calc)   Non-HDL Cholesterol (Calc) 117 <130 mg/dL (calc)  Hemoglobin Y4I  Result Value Ref Range   Hgb A1c MFr Bld 5.8 (H) <5.7 % of total Hgb   Mean Plasma Glucose 120 mg/dL   eAG (mmol/L) 6.6 mmol/L      Assessment & Plan:   Problem List Items Addressed This Visit   None      Follow up plan: No follow-ups on file.

## 2023-05-03 ENCOUNTER — Ambulatory Visit: Payer: Medicaid Other | Admitting: Nurse Practitioner

## 2023-05-03 DIAGNOSIS — F411 Generalized anxiety disorder: Secondary | ICD-10-CM

## 2023-05-03 DIAGNOSIS — F329 Major depressive disorder, single episode, unspecified: Secondary | ICD-10-CM

## 2023-05-09 ENCOUNTER — Ambulatory Visit
Admission: EM | Admit: 2023-05-09 | Discharge: 2023-05-09 | Disposition: A | Discharge status: Home / Self Care | Payer: Medicaid Other | Attending: Emergency Medicine | Admitting: Emergency Medicine

## 2023-05-09 DIAGNOSIS — Z113 Encounter for screening for infections with a predominantly sexual mode of transmission: Secondary | ICD-10-CM | POA: Diagnosis not present

## 2023-05-09 LAB — CERVICOVAGINAL ANCILLARY ONLY
Chlamydia: NEGATIVE
Comment: NEGATIVE
Comment: NEGATIVE
Comment: NORMAL
Neisseria Gonorrhea: NEGATIVE
Trichomonas: NEGATIVE

## 2023-05-09 LAB — HIV ANTIBODY (ROUTINE TESTING W REFLEX): HIV Screen 4th Generation wRfx: NONREACTIVE

## 2023-05-09 NOTE — ED Provider Notes (Signed)
[facility]    CSN: [id number] Arrival date & time: 05/09/23  1056      History   Chief Complaint Chief Complaint  Patient presents with   Exposure to STD    HPI Kristina Mccann is a 32 y.o. female.   HPI  32 year old female with past medical history significant for obesity, migraines, GERD, depression, anxiety, and dysmenorrhea presents with request for STI testing.  She reports that her partner is requesting that she be tested.  She would like subsequent testing for HIV and syphilis in addition to gonorrhea and chlamydia.  She denies any vaginal or urinary symptoms.  Past Medical History:  Diagnosis Date   Acanthosis nigricans    Allergy    Anxiety    Depression    Dysmenorrhea    GERD (gastroesophageal reflux disease)    History of suicide attempt    Migraine without status migrainosus, not intractable    Obesity     Patient Active Problem List   Diagnosis Date Noted   Moderate episode of recurrent major depressive disorder (HCC) 01/03/2023   Hyperlipidemia 04/05/2022   Essential hypertension 02/27/2022   GAD (generalized anxiety disorder) 02/27/2022   Postpartum care following cesarean delivery 02/18/2021   Elevated blood pressure affecting pregnancy in third trimester, antepartum 12/20/2020   Pregnancy with history of cesarean section, antepartum 12/12/2020   Marijuana user 09/18/2020   Maternal morbid obesity in third trimester, antepartum (HCC) 09/08/2020   Supervision of high risk pregnancy, antepartum 09/02/2020   Class 3 severe obesity due to excess calories with serious comorbidity and body mass index (BMI) of 40.0 to 44.9 in adult (HCC) 04/04/2018   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   Vitamin D deficiency 03/22/2016   Acanthosis nigricans 04/11/2015   Major depression, chronic (HCC) 04/11/2015   Gastro-esophageal reflux disease without esophagitis 04/11/2015   H/O suicide attempt 04/11/2015   Migraine without aura and responsive to treatment  04/11/2015   Seasonal allergic rhinitis 04/11/2015    Past Surgical History:  Procedure Laterality Date   BREAST LUMPECTOMY Left 2008   CESAREAN SECTION     CESAREAN SECTION  02/15/2021   Procedure: CESAREAN SECTION;  Surgeon: Conard Novak, MD;  Location: [facility] ORS;  Service: Obstetrics;;    OB History     Gravida  2   Para  2   Term  2   Preterm      AB      Living  2      SAB      IAB      Ectopic      Multiple  0   Live Births  2            Home Medications    Prior to Admission medications   Medication Sig Start Date End Date Taking? Authorizing Provider  NIFEdipine (PROCARDIA XL/NIFEDICAL XL) 60 MG 24 hr tablet Take 1 tablet (60 mg total) by mouth daily. 02/25/23  Yes Berniece Salines, FNP  ondansetron (ZOFRAN-ODT) 4 MG disintegrating tablet Take 1 tablet (4 mg total) by mouth every 8 (eight) hours as needed for nausea or vomiting. 04/05/23  Yes Berniece Salines, FNP  sertraline (ZOLOFT) 25 MG tablet Take 1 tablet (25 mg total) by mouth daily. 02/25/23  Yes Berniece Salines, FNP  etonogestrel (NEXPLANON) 68 MG IMPL implant 1 each (68 mg total) by Subdermal route once for 1 dose. 05/01/21 05/01/21  Conard Novak, MD    Family History Family History  Problem Relation Age of Onset   Migraines Mother    Stroke Father    Diabetes Father    Obesity Father    Hypertension Father    Asthma Brother    Asthma Brother    Asthma Brother    Diabetes Maternal Grandmother    Hypertension Maternal Grandmother     Social History Social History   Tobacco Use   Smoking status: Some Days    Types: Cigarettes    Start date: 04/10/2012    Passive exposure: Current   Smokeless tobacco: Never  Vaping Use   Vaping status: Some Days   Substances: Nicotine  Substance Use Topics   Alcohol use: No    Alcohol/week: 0.0 standard drinks of alcohol   Drug use: Not Currently    Types: Marijuana    Comment: 1-2x day     Allergies   Patient has no known  allergies.   Review of Systems Review of Systems  Constitutional:  Negative for fatigue and fever.  Genitourinary:  Negative for dysuria, frequency, genital sores, hematuria, pelvic pain, urgency, vaginal discharge and vaginal pain.  Skin:  Negative for rash.     Physical Exam Triage Vital Signs ED Triage Vitals  Encounter Vitals Group     BP      Systolic BP Percentile      Diastolic BP Percentile      Pulse      Resp      Temp      Temp src      SpO2      Weight      Height      Head Circumference      Peak Flow      Pain Score      Pain Loc      Pain Education      Exclude from Growth Chart    No data found.  Updated Vital Signs BP (!) 170/94 (BP Location: Left Arm)   Pulse 99   Temp 98.6 F (37 C) (Oral)   Ht 5\' 2"  (1.575 m)   Wt 200 lb (90.7 kg)   LMP 05/07/2023   SpO2 98%   BMI 36.58 kg/m   Visual Acuity Right Eye Distance:   Left Eye Distance:   Bilateral Distance:    Right Eye Near:   Left Eye Near:    Bilateral Near:     Physical Exam Vitals and nursing note reviewed.  Constitutional:      Appearance: Normal appearance. She is not ill-appearing.  HENT:     Head: Normocephalic and atraumatic.  Skin:    General: Skin is warm.     Capillary Refill: Capillary refill takes less than 2 seconds.     Findings: No rash.  Neurological:     General: No focal deficit present.     Mental Status: She is alert and oriented to person, place, and time.      UC Treatments / Results  Labs (all labs ordered are listed, but only abnormal results are displayed) Labs Reviewed  HIV ANTIBODY (ROUTINE TESTING W REFLEX)  RPR  CERVICOVAGINAL ANCILLARY ONLY    EKG   Radiology No results found.  Procedures Procedures (including critical care time)  Medications Ordered in UC Medications - No data to display  Initial Impression / Assessment and Plan / UC Course  I have reviewed the triage vital signs and the nursing notes.  Pertinent labs &  imaging results that were available during my care  of the patient were reviewed by me and considered in my medical decision making (see chart for details).   Patient is a pleasant, nontoxic-appearing 32 year old female here requesting STI testing at the behest of her partner.  She denies any vaginal or urinary symptoms.  She would like testing for gonorrhea, chlamydia, HIV, and syphilis.  I will order the testing and discharge her home to wait for the results.  Since she is not having any symptoms I will not offer empiric treatment at this time.  We did discuss that if her results are positive she will be contacted by phone and treatment options will be provided.  If her results are negative they will appear in her MyChart.  She denies need for work note.   Final Clinical Impressions(s) / UC Diagnoses   Final diagnoses:  Screen for STD (sexually transmitted disease)     Discharge Instructions      Your STI testing will be back either later this afternoon or for Singh in the morning.  If any of your results are positive you will be contacted by phone and treatment options will be provided.  If your results are negative they will appear in your MyChart.     ED Prescriptions   None    PDMP not reviewed this encounter.   Becky Augusta, NP 05/09/23 [phone]

## 2023-05-09 NOTE — Discharge Instructions (Addendum)
Your STI testing will be back either later this afternoon or for Singh in the morning.  If any of your results are positive you will be contacted by phone and treatment options will be provided.  If your results are negative they will appear in your MyChart.

## 2023-05-09 NOTE — ED Triage Notes (Signed)
Pt was instructed to get a STD check from her "baby daddy". Pt states that she needs a general check.   Pt denies any vaginal odor or discharge. Pt denies urinary issues.

## 2023-05-10 LAB — RPR: RPR Ser Ql: NONREACTIVE

## 2023-05-14 DIAGNOSIS — Z419 Encounter for procedure for purposes other than remedying health state, unspecified: Secondary | ICD-10-CM | POA: Diagnosis not present

## 2023-05-23 ENCOUNTER — Other Ambulatory Visit: Payer: Self-pay

## 2023-05-23 ENCOUNTER — Ambulatory Visit (INDEPENDENT_AMBULATORY_CARE_PROVIDER_SITE_OTHER): Payer: Medicaid Other | Admitting: Nurse Practitioner

## 2023-05-23 ENCOUNTER — Encounter: Payer: Self-pay | Admitting: Nurse Practitioner

## 2023-05-23 VITALS — BP 124/72 | HR 88 | Temp 98.1°F | Resp 16 | Ht 62.0 in | Wt 202.3 lb

## 2023-05-23 DIAGNOSIS — N946 Dysmenorrhea, unspecified: Secondary | ICD-10-CM

## 2023-05-23 DIAGNOSIS — Z3009 Encounter for other general counseling and advice on contraception: Secondary | ICD-10-CM | POA: Diagnosis not present

## 2023-05-23 MED ORDER — NAPROXEN 500 MG PO TABS: 500.0000 mg | ORAL_TABLET | Freq: Two times a day (BID) | ORAL | 1 refills | Status: DC

## 2023-05-23 NOTE — Progress Notes (Signed)
BP 124/72   Pulse 88   Temp 98.1 F (36.7 C) (Oral)   Resp 16   Ht 5\' 2"  (1.575 m)   Wt 202 lb 4.8 oz (91.8 kg)   LMP 05/07/2023   SpO2 98%   BMI 37.00 kg/m    Subjective:    Patient ID: Kristina Mccann, female    DOB: 08/09/1991, 32 y.o.   MRN: [id number]  HPI: Kristina Mccann is a 32 y.o. female  Chief Complaint  Patient presents with   Contraception    Soreness around implant birth control, cramping   Contraception/dysmenorrhea/concern for PCOS: Patient had nexplanon inserted on 05/01/2021. Patient reports she would like to have it removed. She says that her nexplanon does not seem to be working anymore. She says she is now having periods and they are lasting an extended time. She reports that her cramps have gotten worse.  She reports that she took some midol but it did not help. Will start patient on naproxen.  Patient is concerned that she has PCOS.  Will get labs. Will place referral to GYN.    Relevant past medical, surgical, family and social history reviewed and updated as indicated. Interim medical history since our last visit reviewed. Allergies and medications reviewed and updated.  Review of Systems  Constitutional: Negative for fever or weight change.  Respiratory: Negative for cough and shortness of breath.   Cardiovascular: Negative for chest pain or palpitations.  Gastrointestinal: Negative for abdominal pain, no bowel changes.  Musculoskeletal: Negative for gait problem or joint swelling.  Skin: Negative for rash.  Neurological: Negative for dizziness or headache.  No other specific complaints in a complete review of systems (except as listed in HPI above).      Objective:    BP 124/72   Pulse 88   Temp 98.1 F (36.7 C) (Oral)   Resp 16   Ht 5\' 2"  (1.575 m)   Wt 202 lb 4.8 oz (91.8 kg)   LMP 05/07/2023   SpO2 98%   BMI 37.00 kg/m   Wt Readings from Last 3 Encounters:  05/23/23 202 lb 4.8 oz (91.8 kg)  05/09/23 200 lb (90.7 kg)  04/05/23  208 lb 11.2 oz (94.7 kg)    Physical Exam  Constitutional: Patient appears well-developed and well-nourished. Obese  No distress.  HEENT: head atraumatic, normocephalic, pupils equal and reactive to light, neck supple, throat within normal limits Cardiovascular: Normal rate, regular rhythm and normal heart sounds.  No murmur heard. No BLE edema. Pulmonary/Chest: Effort normal and breath sounds normal. No respiratory distress. Abdominal: Soft.  There is no tenderness. Psychiatric: Patient has a normal mood and affect. behavior is normal. Judgment and thought content normal.  Results for orders placed or performed during the hospital encounter of 05/09/23  HIV Antibody (routine testing w rflx)  Result Value Ref Range   HIV Screen 4th Generation wRfx Non Reactive Non Reactive  RPR  Result Value Ref Range   RPR Ser Ql NON REACTIVE NON REACTIVE  Cervicovaginal ancillary only  Result Value Ref Range   Neisseria Gonorrhea Negative    Chlamydia Negative    Trichomonas Negative    Comment Normal Reference Range Trichomonas - Negative    Comment Normal Reference Ranger Chlamydia - Negative    Comment      Normal Reference Range Neisseria Gonorrhea - Negative      Assessment & Plan:   Problem List Items Addressed This Visit   None Visit Diagnoses  Birth control counseling    -  Primary   Referral placed for GYN.   Relevant Orders   Ambulatory referral to Gynecology   Dysmenorrhea       patient concerned taht she  has PCOS.  Getting labs.  Also start patient on naproxen for menstrual cramps.  Referral placed to GYN   Relevant Medications   naproxen (NAPROSYN) 500 MG tablet   Other Relevant Orders   Ambulatory referral to Gynecology   17-Hydroxyprogesterone   FSH/LH   TSH   CBC with Differential/Platelet   Insulin, Free (Bioactive)        Follow up plan: Return if symptoms worsen or fail to improve.

## 2023-05-24 ENCOUNTER — Encounter: Payer: Self-pay | Admitting: Nurse Practitioner

## 2023-05-27 ENCOUNTER — Other Ambulatory Visit: Payer: Self-pay | Admitting: Nurse Practitioner

## 2023-05-27 DIAGNOSIS — L732 Hidradenitis suppurativa: Secondary | ICD-10-CM

## 2023-05-29 LAB — INSULIN, FREE (BIOACTIVE): Insulin, Free: 22.3 u[IU]/mL — ABNORMAL HIGH (ref 1.5–14.9)

## 2023-05-29 LAB — CBC WITH DIFFERENTIAL/PLATELET
Absolute Monocytes: 350 {cells}/uL (ref 200–950)
Basophils Absolute: 10 {cells}/uL (ref 0–200)
Basophils Relative: 0.2 %
Eosinophils Absolute: 101 {cells}/uL (ref 15–500)
Eosinophils Relative: 2.1 %
HCT: 38.1 % (ref 35.0–45.0)
Hemoglobin: 12.1 g/dL (ref 11.7–15.5)
Lymphs Abs: 1800 {cells}/uL (ref 850–3900)
MCH: 27.5 pg (ref 27.0–33.0)
MCHC: 31.8 g/dL — ABNORMAL LOW (ref 32.0–36.0)
MCV: 86.6 fL (ref 80.0–100.0)
MPV: 11 fL (ref 7.5–12.5)
Monocytes Relative: 7.3 %
Neutro Abs: 2539 {cells}/uL (ref 1500–7800)
Neutrophils Relative %: 52.9 %
Platelets: 401 10*3/uL — ABNORMAL HIGH (ref 140–400)
RBC: 4.4 10*6/uL (ref 3.80–5.10)
RDW: 13.6 % (ref 11.0–15.0)
Total Lymphocyte: 37.5 %
WBC: 4.8 10*3/uL (ref 3.8–10.8)

## 2023-05-29 LAB — FSH/LH
FSH: 2.2 m[IU]/mL
LH: 2.2 m[IU]/mL

## 2023-05-29 LAB — TSH: TSH: 0.94 m[IU]/L

## 2023-05-29 LAB — 17-HYDROXYPROGESTERONE: 17-OH-Progesterone, LC/MS/MS: 51 ng/dL

## 2023-06-06 ENCOUNTER — Ambulatory Visit: Payer: Medicaid Other | Admitting: Licensed Practical Nurse

## 2023-06-06 VITALS — BP 151/107 | HR 79 | Wt 199.5 lb

## 2023-06-06 DIAGNOSIS — Z3046 Encounter for surveillance of implantable subdermal contraceptive: Secondary | ICD-10-CM

## 2023-06-06 NOTE — Progress Notes (Signed)
SUBJECTIVE: Here for Nexplanon removal. She has had it for about 2 years. Since insertion she has had monthly cycles that last 10 days and are heavy, she needs to wear depends which she changes 3 times a day.  Kristina Mccann is also worried the nexplanon is causing her not be able to lose weight, her mood symptoms and headaches. Kristina Mccann has used various forms of contraception in the past. She would forget to take the pill, she became pregnant while on the patch, Nexplanon has usually worked well for her. She would prefer to not use any form of hormonal contraception. When not on hormones, her cycles are monthly, last 5-7days with a "medium" flow. She plans to use condoms.   Her PCP manages her depression symptoms   Kristina Mccann has a hx of CHTN, pt states she did not take the blood pressure medication-Procardia 60mg , that is on her medication list today.   OBJECTIVE: BP (!) 151/107   Pulse 79   Wt 199 lb 8 oz (90.5 kg)   LMP 06/03/2023 (Exact Date)   BMI 36.49 kg/m    GYNECOLOGY PROCEDURE NOTE  Implanon removal discussed in detail.  Risks of infection, bleeding, nerve injury all reviewed.  Patient understands risks and desires to proceed.  Verbal consent obtained.  Patient is certain she wants the implanon removed.  All questions answered.  Procedure: Patient placed in dorsal supine with left arm above head, elbow flexed at 90 degrees, arm resting on examination table.  Implanon identified without problems.  Betadine scrub x3.  1 ml of 1% lidocaine injected under implanon device without problems.  Sterile gloves applied.  Small 0.5cm incision made at distal tip of implanon device with 11 blade scalpel.  Implanon brought to incision and grasped with a small kelly clamp.  Implanon removed intact without problems.  Pressure applied to incision.  Hemostasis obtained.  Steri-strips applied, followed by bandage and compression dressing.  Patient tolerated procedure well.  No complications.   Assessment: 32  y.o. year old female now s/p uncomplicated implanon removal.  Plan: 1.  Patient given post procedure precautions and asked to call for fever, chills, redness or drainage from her incision, bleeding from incision.  She understands she will likely have a small bruise near site of removal and can remove bandage tomorrow and steri-strips in approximately 1 week.  2) Contraception condoms, discussed potential use of Hormonal IUD.   3) elevated BP, rec taking medication  -on review of chart Procardia flagged as patient not taking, Pt called LVM to clarify if she is indeed taking this medication and to follow  up with her PCP   Carie Caddy, CNM   [facility]  06/06/23  12:37 PM

## 2023-06-14 DIAGNOSIS — Z419 Encounter for procedure for purposes other than remedying health state, unspecified: Secondary | ICD-10-CM | POA: Diagnosis not present

## 2023-07-05 ENCOUNTER — Ambulatory Visit: Payer: Medicaid Other | Admitting: Internal Medicine

## 2023-07-14 DIAGNOSIS — Z419 Encounter for procedure for purposes other than remedying health state, unspecified: Secondary | ICD-10-CM | POA: Diagnosis not present

## 2023-07-31 ENCOUNTER — Other Ambulatory Visit: Payer: Self-pay | Admitting: Nurse Practitioner

## 2023-07-31 ENCOUNTER — Encounter: Payer: Self-pay | Admitting: Nurse Practitioner

## 2023-07-31 DIAGNOSIS — F331 Major depressive disorder, recurrent, moderate: Secondary | ICD-10-CM

## 2023-07-31 DIAGNOSIS — F411 Generalized anxiety disorder: Secondary | ICD-10-CM

## 2023-07-31 DIAGNOSIS — I10 Essential (primary) hypertension: Secondary | ICD-10-CM

## 2023-07-31 MED ORDER — NIFEDIPINE ER OSMOTIC RELEASE 60 MG PO TB24: 60.0000 mg | ORAL_TABLET | Freq: Every day | ORAL | 1 refills | Status: DC

## 2023-07-31 MED ORDER — SERTRALINE HCL 25 MG PO TABS
25.0000 mg | ORAL_TABLET | Freq: Every day | ORAL | 1 refills | Status: AC
Start: 2023-07-31 — End: ?

## 2023-08-14 DIAGNOSIS — Z419 Encounter for procedure for purposes other than remedying health state, unspecified: Secondary | ICD-10-CM | POA: Diagnosis not present

## 2023-08-15 ENCOUNTER — Ambulatory Visit
Admission: EM | Admit: 2023-08-15 | Discharge: 2023-08-15 | Disposition: A | Discharge status: Home / Self Care | Payer: Medicaid Other | Attending: Family Medicine | Admitting: Family Medicine

## 2023-08-15 DIAGNOSIS — Z202 Contact with and (suspected) exposure to infections with a predominantly sexual mode of transmission: Secondary | ICD-10-CM | POA: Insufficient documentation

## 2023-08-15 NOTE — ED Triage Notes (Signed)
 Pt presents for an STD check  Pt denies vaginal odor or discharge.   PT denies being worried about any particular disease and states that this is a regular check up.

## 2023-08-15 NOTE — ED Provider Notes (Signed)
 [facility]    CSN: [id number] Arrival date & time: 08/15/23  1849      History   Chief Complaint Chief Complaint  Patient presents with   SEXUALLY TRANSMITTED DISEASE     HPI HPI Katarina L Quam is a 33 y.o. female.    Najat L Chiara presents for STD testing. Denies symptoms. No new partners.   Denies known STI exposure.  Bessy does not use condoms regularly. She is  not currently pregnant.  Patient's last menstrual period was 08/03/2023.        Past Medical History:  Diagnosis Date   Acanthosis nigricans    Allergy    Anxiety    Depression    Dysmenorrhea    GERD (gastroesophageal reflux disease)    History of suicide attempt    Migraine without status migrainosus, not intractable    Obesity     Patient Active Problem List   Diagnosis Date Noted   Moderate episode of recurrent major depressive disorder (HCC) 01/03/2023   Hyperlipidemia 04/05/2022   Essential hypertension 02/27/2022   GAD (generalized anxiety disorder) 02/27/2022   Marijuana user 09/18/2020   Class 3 severe obesity due to excess calories with serious comorbidity and body mass index (BMI) of 40.0 to 44.9 in adult (HCC) 04/04/2018   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   Vitamin D  deficiency 03/22/2016   Acanthosis nigricans 04/11/2015   Major depression, chronic (HCC) 04/11/2015   Gastro-esophageal reflux disease without esophagitis 04/11/2015   H/O suicide attempt 04/11/2015   Migraine without aura and responsive to treatment 04/11/2015   Seasonal allergic rhinitis 04/11/2015    Past Surgical History:  Procedure Laterality Date   BREAST LUMPECTOMY Left 2008   CESAREAN SECTION     CESAREAN SECTION  02/15/2021   Procedure: CESAREAN SECTION;  Surgeon: Leonce Garnette BIRCH, MD;  Location: [facility] ORS;  Service: Obstetrics;;    OB History     Gravida  2   Para  2   Term  2   Preterm      AB      Living  2      SAB      IAB      Ectopic      Multiple  0   Live  Births  2            Home Medications    Prior to Admission medications   Medication Sig Start Date End Date Taking? Authorizing Provider  NIFEdipine  (PROCARDIA  XL/NIFEDICAL XL) 60 MG 24 hr tablet Take 1 tablet (60 mg total) by mouth daily. 07/31/23  Yes Gareth Mliss FALCON, FNP  sertraline  (ZOLOFT ) 25 MG tablet Take 1 tablet (25 mg total) by mouth daily. 07/31/23  Yes Gareth Mliss FALCON, FNP  naproxen  (NAPROSYN ) 500 MG tablet Take 1 tablet (500 mg total) by mouth 2 (two) times daily with a meal. 05/23/23   Gareth Mliss FALCON, FNP    Family History Family History  Problem Relation Age of Onset   Migraines Mother    Stroke Father    Diabetes Father    Obesity Father    Hypertension Father    Asthma Brother    Asthma Brother    Asthma Brother    Diabetes Maternal Grandmother    Hypertension Maternal Grandmother     Social History Social History   Tobacco Use   Smoking status: Some Days    Types: Cigarettes    Start date: 04/10/2012    Passive exposure: Current  Smokeless tobacco: Never  Vaping Use   Vaping status: Some Days   Substances: Nicotine   Substance Use Topics   Alcohol use: No    Alcohol/week: 0.0 standard drinks of alcohol   Drug use: Not Currently    Types: Marijuana    Comment: 1-2x day     Allergies   Patient has no known allergies.   Review of Systems Review of Systems: :negative unless otherwise stated in HPI.      Physical Exam Triage Vital Signs ED Triage Vitals  Encounter Vitals Group     BP 08/15/23 1946 (!) 174/113     Systolic BP Percentile --      Diastolic BP Percentile --      Pulse Rate 08/15/23 1946 100     Resp --      Temp 08/15/23 1946 99.3 F (37.4 C)     Temp Source 08/15/23 1946 Oral     SpO2 08/15/23 1946 100 %     Weight 08/15/23 1945 195 lb (88.5 kg)     Height 08/15/23 1945 5' 2 (1.575 m)     Head Circumference --      Peak Flow --      Pain Score 08/15/23 1944 0     Pain Loc --      Pain Education --       Exclude from Growth Chart --    No data found.  Updated Vital Signs BP (!) 174/113 (BP Location: Left Arm)   Pulse 100   Temp 99.3 F (37.4 C) (Oral)   Ht 5' 2 (1.575 m)   Wt 88.5 kg   LMP 08/03/2023   SpO2 100%   BMI 35.67 kg/m   Visual Acuity Right Eye Distance:   Left Eye Distance:   Bilateral Distance:    Right Eye Near:   Left Eye Near:    Bilateral Near:     Physical Exam GEN: well appearing female in no acute distress  CVS: well perfused  RESP: speaking in full sentences without pause  GU: deferred, patient performed self swab     UC Treatments / Results  Labs (all labs ordered are listed, but only abnormal results are displayed) Labs Reviewed  HIV ANTIBODY (ROUTINE TESTING W REFLEX)  RPR  CERVICOVAGINAL ANCILLARY ONLY    EKG   Radiology No results found.  Procedures Procedures (including critical care time)  Medications Ordered in UC Medications - No data to display  Initial Impression / Assessment and Plan / UC Course  I have reviewed the triage vital signs and the nursing notes.  Pertinent labs & imaging results that were available during my care of the patient were reviewed by me and considered in my medical decision making (see chart for details).     Patient is a 33 y.o.SABRA female who presents for STD testing.  Overall patient is well-appearing and afebrile.  Vital signs stable.  Denies symptoms and doesn't request prophylactic treatment.  Obtained gonorrhea, chlamydia and trichomonas swab.  Recommended HIV and syphilis and she is agreeable.  Advised her to use condoms with every sexual encounter.  Safe sex handout provided.   Discussed MDM, treatment plan and plan for follow-up with patient who agrees with plan.      Final Clinical Impressions(s) / UC Diagnoses   Final diagnoses:  Possible exposure to STD     Discharge Instructions       Your STD test results will be available in the next 72 hours. If  positive, someone will  contact you.  You should see your results in your MyChart account.       ED Prescriptions   None    PDMP not reviewed this encounter.   Spenser Cong, DO 08/15/23 1958

## 2023-08-15 NOTE — Discharge Instructions (Addendum)
 Your STD test results will be available in the next 72 hours. If positive, someone will contact you.  You should see your results in your MyChart account.

## 2023-08-16 LAB — HIV ANTIBODY (ROUTINE TESTING W REFLEX): HIV Screen 4th Generation wRfx: NONREACTIVE

## 2023-08-17 ENCOUNTER — Emergency Department
Admission: EM | Admit: 2023-08-17 | Discharge: 2023-08-17 | Disposition: A | Discharge status: Home / Self Care | Payer: Medicaid Other | Attending: Emergency Medicine | Admitting: Emergency Medicine

## 2023-08-17 ENCOUNTER — Other Ambulatory Visit: Payer: Self-pay

## 2023-08-17 DIAGNOSIS — J069 Acute upper respiratory infection, unspecified: Secondary | ICD-10-CM | POA: Insufficient documentation

## 2023-08-17 DIAGNOSIS — B9789 Other viral agents as the cause of diseases classified elsewhere: Secondary | ICD-10-CM | POA: Diagnosis not present

## 2023-08-17 DIAGNOSIS — Z20822 Contact with and (suspected) exposure to covid-19: Secondary | ICD-10-CM | POA: Diagnosis not present

## 2023-08-17 DIAGNOSIS — J029 Acute pharyngitis, unspecified: Secondary | ICD-10-CM

## 2023-08-17 DIAGNOSIS — J028 Acute pharyngitis due to other specified organisms: Secondary | ICD-10-CM | POA: Diagnosis not present

## 2023-08-17 LAB — RESP PANEL BY RT-PCR (RSV, FLU A&B, COVID)  RVPGX2
Influenza A by PCR: NEGATIVE
Influenza B by PCR: NEGATIVE
Resp Syncytial Virus by PCR: NEGATIVE
SARS Coronavirus 2 by RT PCR: NEGATIVE

## 2023-08-17 LAB — GROUP A STREP BY PCR: Group A Strep by PCR: NOT DETECTED

## 2023-08-17 LAB — RPR: RPR Ser Ql: NONREACTIVE

## 2023-08-17 NOTE — ED Provider Triage Note (Addendum)
 Emergency Medicine Provider Triage Evaluation Note  Kristina Mccann , a 33 y.o. female  was evaluated in triage.  Pt complains of sore throat, body aches, cough, congestion. States symptoms first began on 1/1. Has been taking theraflu at home.  Review of Systems  Positive: Cough, congestion, body aches, sore throat Negative: fever  Physical Exam  LMP 08/03/2023  Gen:   Awake, no distress   Resp:  Normal effort  MSK:   Moves extremities without difficulty  Other:    Medical Decision Making  Medically screening exam initiated at 9:42 AM.  Appropriate orders placed.  Kristina Mccann was informed that the remainder of the evaluation will be completed by another provider, this initial triage assessment does not replace that evaluation, and the importance of remaining in the ED until their evaluation is complete.     Cleaster Tinnie LABOR, PA-C 08/17/23 0944    Cleaster Tinnie LABOR, PA-C 08/17/23 0945

## 2023-08-17 NOTE — ED Provider Notes (Signed)
 [facility] Provider Note   Event Date/Time   First MD Initiated Contact with Patient 08/17/23 1038     (approximate) History  Sore Throat  HPI Kristina Mccann is a 33 y.o. female who presents complaining of sore throat, generalized bodyaches, subjective fevers, nausea, and decreased appetite over the last 3 days.  Patient denies any recent travel or sick contacts.  Patient denies any productive cough.  Patient also endorses associated chest pain that is pleuritic and shortness of breath ROS: Patient currently denies any vision changes, tinnitus, difficulty speaking, facial droop, abdominal pain, nausea/vomiting/diarrhea, dysuria, or weakness/numbness/paresthesias in any extremity   Physical Exam  Triage Vital Signs: ED Triage Vitals  Encounter Vitals Group     BP 08/17/23 0944 (!) 171/122     Systolic BP Percentile --      Diastolic BP Percentile --      Pulse Rate 08/17/23 0943 85     Resp 08/17/23 0943 20     Temp 08/17/23 0943 98.2 F (36.8 C)     Temp Source 08/17/23 0943 Oral     SpO2 08/17/23 0943 100 %     Weight --      Height --      Head Circumference --      Peak Flow --      Pain Score 08/17/23 0942 0     Pain Loc --      Pain Education --      Exclude from Growth Chart --    Most recent vital signs: Vitals:   08/17/23 0943 08/17/23 0944  BP:  (!) 171/122  Pulse: 85   Resp: 20   Temp: 98.2 F (36.8 C)   SpO2: 100%    General: Awake, oriented x4. CV:  Good peripheral perfusion.  Resp:  Normal effort.  Abd:  No distention.  Other:  Middle-aged obese African-American female resting comfortably in no acute distress.  Erythematous posterior oropharynx.  Anterior cervical lymphadenopathy ED Results / Procedures / Treatments  Labs (all labs ordered are listed, but only abnormal results are displayed) Labs Reviewed  RESP PANEL BY RT-PCR (RSV, FLU A&B, COVID)  [id number]  GROUP A STREP BY PCR   PROCEDURES: Critical Care performed:  No Procedures MEDICATIONS ORDERED IN ED: Medications - No data to display IMPRESSION / MDM / ASSESSMENT AND PLAN / ED COURSE  I reviewed the triage vital signs and the nursing notes.                             The patient is on the cardiac monitor to evaluate for evidence of arrhythmia and/or significant heart rate changes. Patient's presentation is most consistent with acute presentation with potential threat to life or bodily function. 34 year old female presents for sore throat No history of immunocompromise. Nontoxic appearance. Patient euvolemic with no trismus. No airway compromise. No change in voice, exudates, enlarged lymph nodes. Able to tolerate PO. Given History and Exam I have low suspicion for this presentation being caused by PTA, RPA, Ludwigs angina, Epiglottitis or Bacterial Tracheitis, EBV, acute HIV, or Strep throat.  Dispo: Discharge home   FINAL CLINICAL IMPRESSION(S) / ED DIAGNOSES   Final diagnoses:  Viral pharyngitis  Upper respiratory tract infection, unspecified type   Rx / DC Orders   ED Discharge Orders     None      Note:  This document was prepared using Dragon voice recognition software and  may include unintentional dictation errors.   Solita Macadam K, MD 08/17/23 [phone]

## 2023-08-17 NOTE — ED Triage Notes (Signed)
 Patient states sore throat and congestion since yesterday

## 2023-08-19 ENCOUNTER — Encounter (HOSPITAL_COMMUNITY): Payer: Self-pay

## 2023-08-19 LAB — CERVICOVAGINAL ANCILLARY ONLY
Bacterial Vaginitis (gardnerella): POSITIVE — AB
Candida Glabrata: NEGATIVE
Candida Vaginitis: NEGATIVE
Chlamydia: NEGATIVE
Comment: NEGATIVE
Comment: NEGATIVE
Comment: NEGATIVE
Comment: NEGATIVE
Comment: NEGATIVE
Comment: NORMAL
Neisseria Gonorrhea: NEGATIVE
Trichomonas: NEGATIVE

## 2023-09-03 ENCOUNTER — Other Ambulatory Visit (HOSPITAL_COMMUNITY)
Admission: RE | Admit: 2023-09-03 | Discharge: 2023-09-03 | Disposition: A | Discharge status: Home / Self Care | Payer: Medicaid Other | Source: Ambulatory Visit | Attending: Licensed Practical Nurse | Admitting: Licensed Practical Nurse

## 2023-09-03 ENCOUNTER — Ambulatory Visit (INDEPENDENT_AMBULATORY_CARE_PROVIDER_SITE_OTHER): Payer: Medicaid Other | Admitting: Licensed Practical Nurse

## 2023-09-03 ENCOUNTER — Encounter: Payer: Self-pay | Admitting: Licensed Practical Nurse

## 2023-09-03 VITALS — BP 172/105 | HR 76 | Ht 62.0 in | Wt 198.8 lb

## 2023-09-03 DIAGNOSIS — I10 Essential (primary) hypertension: Secondary | ICD-10-CM

## 2023-09-03 DIAGNOSIS — N6452 Nipple discharge: Secondary | ICD-10-CM | POA: Diagnosis not present

## 2023-09-03 DIAGNOSIS — Z124 Encounter for screening for malignant neoplasm of cervix: Secondary | ICD-10-CM

## 2023-09-03 DIAGNOSIS — Z01419 Encounter for gynecological examination (general) (routine) without abnormal findings: Secondary | ICD-10-CM

## 2023-09-03 DIAGNOSIS — Z113 Encounter for screening for infections with a predominantly sexual mode of transmission: Secondary | ICD-10-CM

## 2023-09-03 DIAGNOSIS — A5901 Trichomonal vulvovaginitis: Secondary | ICD-10-CM | POA: Diagnosis not present

## 2023-09-03 DIAGNOSIS — Z3200 Encounter for pregnancy test, result unknown: Secondary | ICD-10-CM

## 2023-09-03 DIAGNOSIS — B9689 Other specified bacterial agents as the cause of diseases classified elsewhere: Secondary | ICD-10-CM | POA: Insufficient documentation

## 2023-09-03 DIAGNOSIS — Z3202 Encounter for pregnancy test, result negative: Secondary | ICD-10-CM

## 2023-09-03 DIAGNOSIS — N760 Acute vaginitis: Secondary | ICD-10-CM | POA: Insufficient documentation

## 2023-09-03 DIAGNOSIS — F329 Major depressive disorder, single episode, unspecified: Secondary | ICD-10-CM

## 2023-09-03 LAB — POCT URINE PREGNANCY: Preg Test, Ur: NEGATIVE

## 2023-09-03 NOTE — Assessment & Plan Note (Signed)
BP 172/105 today. Reports she has not taken her procardia yet and will take when she gets home.

## 2023-09-03 NOTE — Progress Notes (Unsigned)
Outpatient Gynecology Note: Annual Visit  Assessment/Plan:    Kristina Mccann is a 33 y.o. female G2P2002 with normal well-woman gynecologic exam.   Major depression, chronic (HCC) Feeling stable on current dose of sertraline. Managed by her PCP.   Essential hypertension BP 172/105 today. Reports she has not taken her procardia yet and will take when she gets home.   Encounter for screening examination for sexually transmitted infection GC swab sent today. She recently had additional STI testing done and declines blood testing today.  Pap also done today.   Nipple discharge Bilateral white nipple discharge- scant amount.  Will draw labs for prolactin levels.      Orders Placed This Encounter  Procedures   Beta HCG, Quant   Prolactin   POCT urine pregnancy   Current Outpatient Medications  Medication Instructions   naproxen (NAPROSYN) 500 mg, Oral, 2 times daily with meals   NIFEdipine (PROCARDIA XL/NIFEDICAL XL) 60 mg, Oral, Daily   sertraline (ZOLOFT) 25 mg, Oral, Daily    No follow-ups on file.    Subjective:    Kristina Mccann is a 33 y.o. female G2P2002 who presents for annual wellness visit.   Lives with her boyfriend and their 2 children, age 75 and 2.     CONCERNS? Having some nausea, breast tenderness. UPT done in clinic today. She will repeat a home test next week. She also is wanting to get STI testing done as she reports her partner (female) has been having symptoms of pain with urination and discharge. She reports she had testing done at the beginning of January as well.   Well Woman Visit:  GYN HISTORY:  Patient's last menstrual period was 08/03/2023 (exact date).     Menstrual History: OB History     Gravida  2   Para  2   Term  2   Preterm      AB      Living  2      SAB      IAB      Ectopic      Multiple  0   Live Births  2           Patient's last menstrual period was 08/03/2023 (exact date).    Periods are  regular and last 5 days. Her period in December only lasted 2.5 days and was lighter than usual.  Dysmenorrhea: none.   Cyclic symptoms include: none.  Intermenstrual bleeding, spotting, or discharge? none Urinary incontinence? none  Sexually active: yes Number of sexual partners: 1 Gender of sexual Partners: male Dyspareunia? none Last pap: 2022, NILM History of abnormal Pap: no Gardasil series:  unknown STI history: none STI/HIV testing or immunizations needed? Yes.    Contraceptive methods: condoms  Health Maintenance > Reviewed breast self-awareness > Exercise: goes to the gym 4x weekly > Body mass index is 36.36 kg/m.  > Recent dental visit: No. She is trying to find a dentist that takes medicaid.  > Seat Belt Use: Yes.   > Texting and driving? No. > Concern for alcohol abuse? no   Tobacco Use: High Risk (08/17/2023)   Patient History    Smoking Tobacco Use: Some Days    Smokeless Tobacco Use: Never    Passive Exposure: Current      _________________________________________________________  Current Outpatient Medications  Medication Sig Dispense Refill   NIFEdipine (PROCARDIA XL/NIFEDICAL XL) 60 MG 24 hr tablet Take 1 tablet (60 mg total) by mouth daily. 90  tablet 1   sertraline (ZOLOFT) 25 MG tablet Take 1 tablet (25 mg total) by mouth daily. 90 tablet 1   naproxen (NAPROSYN) 500 MG tablet Take 1 tablet (500 mg total) by mouth 2 (two) times daily with a meal. 20 tablet 1   No current facility-administered medications for this visit.   No Known Allergies  Past Medical History:  Diagnosis Date   Acanthosis nigricans    Allergy    Anxiety    Depression    Dysmenorrhea    GERD (gastroesophageal reflux disease)    History of suicide attempt    Migraine without status migrainosus, not intractable    Obesity    Past Surgical History:  Procedure Laterality Date   BREAST LUMPECTOMY Left 2008   CESAREAN SECTION     CESAREAN SECTION  02/15/2021   Procedure:  CESAREAN SECTION;  Surgeon: Conard Novak, MD;  Location: [facility] ORS;  Service: Obstetrics;;   OB History     Gravida  2   Para  2   Term  2   Preterm      AB      Living  2      SAB      IAB      Ectopic      Multiple  0   Live Births  2          Social History   Tobacco Use   Smoking status: Some Days    Types: Cigarettes    Start date: 04/10/2012    Passive exposure: Current   Smokeless tobacco: Never  Substance Use Topics   Alcohol use: Yes    Comment: occa   Social History   Substance and Sexual Activity  Sexual Activity Yes   Partners: Male   Birth control/protection: Condom    Immunization History  Administered Date(s) Administered   DTaP 03/18/1992, 05/23/1992, 07/25/1992, 04/26/1993, 10/05/1996   HIB (PRP-OMP) 03/18/1992, 05/23/1992, 04/26/1993, 10/05/1996   Hepatitis A 08/30/2008   Hepatitis A, Adult 08/30/2008   Hepatitis B 1992-03-23, 03/18/1992, 10/25/1992   Hepatitis B, ADULT 05-22-92, 03/18/1992, 10/25/1992, 09/09/2020   IPV 03/18/1992, 05/23/1992, 07/25/1992, 04/26/1993   Influenza,inj,Quad PF,6+ Mos 04/11/2015   MMR 04/26/1993, 10/05/1996   PPD Test 09/09/2020   Td 08/30/2008   Tdap 08/13/2010     Review Of Systems  Constitutional: Denied constitutional symptoms, night sweats, recent illness, fatigue, fever, insomnia and weight loss.  Eyes: Denied eye symptoms, eye pain, photophobia, vision change and visual disturbance.  Ears/Nose/Throat/Neck: Denied ear, nose, throat or neck symptoms, hearing loss, nasal discharge, sinus congestion and sore throat.  Cardiovascular: Denied cardiovascular symptoms, arrhythmia, chest pain/pressure, edema, exercise intolerance, orthopnea and palpitations.  Respiratory: Denied pulmonary symptoms, asthma, pleuritic pain, productive sputum, cough, dyspnea and wheezing.  Gastrointestinal: Denied, gastro-esophageal reflux, melena, vomiting.  +nausea  Genitourinary: Denied genitourinary symptoms  including symptomatic vaginal discharge, pelvic relaxation issues, and urinary complaints.  Musculoskeletal: Denied musculoskeletal symptoms, stiffness, swelling, muscle weakness and myalgia.  Dermatologic: Denied dermatology symptoms, rash and scar.  Neurologic: Denied neurology symptoms, dizziness, headache, neck pain and syncope.  Psychiatric: Denied psychiatric symptoms, anxiety and depression.  Endocrine: Denied endocrine symptoms including hot flashes and night sweats.      Objective:    BP (!) 172/105 (BP Location: Left Arm, Cuff Size: Normal)   Pulse 76   Ht 5\' 2"  (1.575 m)   Wt 90.2 kg   LMP 08/03/2023 (Exact Date)   Breastfeeding No   BMI 36.36 kg/m  Constitutional: Well-developed, well-nourished female in no acute distress Neurological: Alert and oriented to person, place, and time Psychiatric: Mood and affect appropriate Skin: No rashes or lesions Neck: Supple without masses. Trachea is midline.Thyroid is normal size without masses Lymphatics: No cervical, axillary, supraclavicular, or inguinal adenopathy noted Respiratory: Clear to auscultation bilaterally. Good air movement with normal work of breathing. Cardiovascular: Regular rate and rhythm. Extremities grossly normal, nontender with no edema; pulses regular Gastrointestinal: Soft, nontender, nondistended. No masses or hernias appreciated. No hepatosplenomegaly. No fluid wave. No rebound or guarding. Breast Exam: No masses palpated. No skin changes noted. Scant white discharge from nipples noted. Small fluctant area noted underneath left breast.  Genitourinary:         External Genitalia: Normal female genitalia    Vagina: no lesions.    Cervix: No lesions, normal size and consistency; no cervical motion tenderness     Uterus: Normal size and contour; smooth, mobile, NT, midposition. Adnexae: Non-palpable and non-tender Perineum/Anus: No lesions Rectal: deferred    Cindra Eves, SNM Carie Caddy, CNM  present for all portions of care.

## 2023-09-03 NOTE — Assessment & Plan Note (Addendum)
GC swab sent today. She recently had additional STI testing done and declines blood testing today.  Pap also done today.

## 2023-09-03 NOTE — Assessment & Plan Note (Signed)
Feeling stable on current dose of sertraline. Managed by her PCP.

## 2023-09-03 NOTE — Assessment & Plan Note (Signed)
Bilateral white nipple discharge- scant amount.  Will draw labs for prolactin levels.

## 2023-09-04 ENCOUNTER — Encounter: Payer: Self-pay | Admitting: Licensed Practical Nurse

## 2023-09-04 ENCOUNTER — Other Ambulatory Visit: Payer: Self-pay | Admitting: Licensed Practical Nurse

## 2023-09-04 ENCOUNTER — Telehealth: Payer: Self-pay

## 2023-09-04 DIAGNOSIS — A599 Trichomoniasis, unspecified: Secondary | ICD-10-CM

## 2023-09-04 DIAGNOSIS — B9689 Other specified bacterial agents as the cause of diseases classified elsewhere: Secondary | ICD-10-CM

## 2023-09-04 LAB — CERVICOVAGINAL ANCILLARY ONLY
Bacterial Vaginitis (gardnerella): POSITIVE — AB
Candida Glabrata: NEGATIVE
Candida Vaginitis: NEGATIVE
Chlamydia: NEGATIVE
Comment: NEGATIVE
Comment: NEGATIVE
Comment: NEGATIVE
Comment: NEGATIVE
Comment: NEGATIVE
Comment: NORMAL
Neisseria Gonorrhea: NEGATIVE
Trichomonas: POSITIVE — AB

## 2023-09-04 LAB — PROLACTIN: Prolactin: 4.9 ng/mL (ref 4.8–33.4)

## 2023-09-04 LAB — BETA HCG QUANT (REF LAB): hCG Quant: <1 m[IU]/mL

## 2023-09-04 MED ORDER — METRONIDAZOLE 500 MG PO TABS
500.0000 mg | ORAL_TABLET | Freq: Two times a day (BID) | ORAL | 1 refills | Status: AC
Start: 2023-09-04 — End: ?

## 2023-09-04 MED ORDER — METRONIDAZOLE 500 MG PO TABS
500.0000 mg | ORAL_TABLET | Freq: Two times a day (BID) | ORAL | 0 refills | Status: DC
Start: 2023-09-04 — End: 2023-09-04

## 2023-09-04 NOTE — Telephone Encounter (Cosign Needed)
Called [facility] to discuss results of beta HCG, STI testing, and prolactin level. We discussed taking metronidazole for treatment for BV and trichomoniasis, which was ordered for pickup at her pharmacy. We discussed the importance of partner treatment. We discussed the results of her negative beta HCG testing, as well as normal prolactin levels. Questions encouraged and answered.   Cindra Eves, SNM Carie Caddy, CNM present for phonecall.

## 2023-09-04 NOTE — Addendum Note (Signed)
Addended by: Carie Caddy on: 09/04/2023 09:31 PM   Modules accepted: Orders

## 2023-09-04 NOTE — Progress Notes (Signed)
Pt seen in clinic for STI testing Swab positive for BV and trich Script for Flagyl sent to pharmacy on file.  TC to pt  Kristina Mccann, CNM  [facility]  09/04/23  9:21 PM

## 2023-09-09 LAB — CYTOLOGY - PAP
Adequacy: ABSENT
Comment: NEGATIVE
Comment: NEGATIVE
Comment: NEGATIVE
HPV 16: NEGATIVE
HPV 18 / 45: NEGATIVE
High risk HPV: POSITIVE — AB

## 2023-09-10 ENCOUNTER — Encounter: Payer: Self-pay | Admitting: Licensed Practical Nurse

## 2023-09-13 NOTE — Progress Notes (Unsigned)
Referring Provider:  Carie Caddy   HPI:  Kristina Mccann is a 33 y.o.  [id number]  who presents today for evaluation and management of abnormal cervical cytology.    Prior pap smears:  Date:09/03/23   HYQ:MVHQ  HPV: positive   Prior cervical / vaginal findings: n/a Date:n/a Results: n/a  Prior cervical treatment(s): n/a Date:n/a Results: n/a  Symptoms/History:  -Abnormal vaginal discharge: before period this time brown and creamy  -Postmenopausal: n/a -Intermenstrual bleeding: no -Postcoital bleeding: no -Bleeding problems (non-gyn): no -Contraception: none -Number of current sexual partners: 1 -Number of partners in lifetime: 12 -History of a high risk partner: yes -History of STDs: yes, +TV, treated -Smoking: vape      ROS:  Pertinent items are noted in HPI.  OB History  Gravida Para Term Preterm AB Living  2 2 2   2   SAB IAB Ectopic Multiple Live Births     0 2    # Outcome Date GA Lbr Len/2nd Weight Sex Type Anes PTL Lv  2 Term 02/15/21 [redacted]w[redacted]d  5 lb 6.1 oz (2.44 kg) F CS-LTranv Spinal  LIV  1 Term 03/23/11   6 lb 6 oz (2.892 kg) F CS-Unspec       Past Medical History:  Diagnosis Date   Acanthosis nigricans    Allergy    Anxiety    Depression    Dysmenorrhea    GERD (gastroesophageal reflux disease)    History of suicide attempt    Migraine without status migrainosus, not intractable    Obesity     Past Surgical History:  Procedure Laterality Date   BREAST LUMPECTOMY Left 2008   CESAREAN SECTION     CESAREAN SECTION  02/15/2021   Procedure: CESAREAN SECTION;  Surgeon: Conard Novak, MD;  Location: [facility] ORS;  Service: Obstetrics;;    SOCIAL HISTORY:  Social History   Substance and Sexual Activity  Alcohol Use Yes   Comment: occa    Social History   Substance and Sexual Activity  Drug Use Not Currently   Types: Marijuana   Comment: 1-2x day     Family History  Problem Relation Age of Onset   Migraines Mother    Stroke Father     Diabetes Father    Obesity Father    Hypertension Father    Asthma Brother    Asthma Brother    Asthma Brother    Diabetes Maternal Grandmother    Hypertension Maternal Grandmother     ALLERGIES:  Patient has no known allergies.  She has a current medication list which includes the following prescription(s): metronidazole, nifedipine, and sertraline.  Physical Exam: -Vitals:  BP 119/82   Pulse 76   Ht 5\' 2"  (1.575 m)   Wt 194 lb (88 kg)   LMP 09/14/2023 (Exact Date)   BMI 35.48 kg/m   PROCEDURE: Colposcopy performed with 4% acetic acid and Lugol's after informed consent obtained.  Physical Exam Vitals and nursing note reviewed. Exam conducted with a chaperone present.  Constitutional:      Appearance: Normal appearance. She is obese.  HENT:     Head: Normocephalic and atraumatic.  Eyes:     Extraocular Movements: Extraocular movements intact.  Pulmonary:     Effort: Pulmonary effort is normal.  Genitourinary:    General: Normal vulva.        Comments: RED = acetowhite lesions and decreased Lugol's  1 = Biopsy taken Neurological:     Mental Status: She is alert.                               -  Aceto-white Lesions Location(s): See above              -Biopsy performed at 6 o'clock               -ECC indicated and performed: Yes.       -Biopsy sites made hemostatic with pressure and Monsel's solution   -Satisfactory colposcopy: Yes.      -Evidence of Invasive cervical CA :  NO  ASSESSMENT:  Kristina Mccann is a 33 y.o. [id number] with LSIL and HPV-HR positive, 16/18/45 NEG on recent pap (09/03/23), here for colposcopy today, performed as above without complications.  -ECC and 1 cervical bx sent to pathology -Aftercare instructions for home reviewed, si/sx of when to call/return discussed. -RTC 2 weeks for results, sooner prn   Julieanne Manson, DO [facility]

## 2023-09-14 DIAGNOSIS — Z419 Encounter for procedure for purposes other than remedying health state, unspecified: Secondary | ICD-10-CM | POA: Diagnosis not present

## 2023-09-16 ENCOUNTER — Ambulatory Visit (INDEPENDENT_AMBULATORY_CARE_PROVIDER_SITE_OTHER): Payer: Medicaid Other | Admitting: Obstetrics

## 2023-09-16 ENCOUNTER — Encounter: Payer: Self-pay | Admitting: Obstetrics

## 2023-09-16 ENCOUNTER — Other Ambulatory Visit (HOSPITAL_COMMUNITY)
Admission: RE | Admit: 2023-09-16 | Discharge: 2023-09-16 | Disposition: A | Discharge status: Home / Self Care | Payer: Medicaid Other | Source: Ambulatory Visit | Attending: Obstetrics | Admitting: Obstetrics

## 2023-09-16 VITALS — BP 119/82 | HR 76 | Ht 62.0 in | Wt 194.0 lb

## 2023-09-16 DIAGNOSIS — N870 Mild cervical dysplasia: Secondary | ICD-10-CM | POA: Diagnosis not present

## 2023-09-16 DIAGNOSIS — R87612 Low grade squamous intraepithelial lesion on cytologic smear of cervix (LGSIL): Secondary | ICD-10-CM | POA: Diagnosis not present

## 2023-09-16 DIAGNOSIS — R87810 Cervical high risk human papillomavirus (HPV) DNA test positive: Secondary | ICD-10-CM | POA: Insufficient documentation

## 2023-09-16 DIAGNOSIS — B977 Papillomavirus as the cause of diseases classified elsewhere: Secondary | ICD-10-CM | POA: Insufficient documentation

## 2023-09-16 NOTE — Patient Instructions (Signed)
 Colposcopy, Care After  The following information offers guidance on how to care for yourself after your procedure. Your doctor may also give you more specific instructions. If you have problems or questions, contact your doctor. What can I expect after the procedure? If you did not have a sample of your tissue taken out (did not have a biopsy), you may only have some spotting of blood for a few days. You can go back to your normal activities. If you had a sample of your tissue taken out, it is common to have: Soreness and mild pain. These may last for a few days. Mild bleeding or fluid (discharge) coming from your vagina. The fluid will look dark and grainy. You may have this for a few days. The fluid may be caused by a liquid that was used during your procedure. You may need to wear a sanitary pad. Spotting of blood for at least 48 hours after the procedure. Follow these instructions at home: Medicines Take over-the-counter and prescription medicines only as told by your doctor. Ask your doctor what over-the-counter pain medicines and prescription medicines you can start taking again. This is very important if you take blood thinners. Activity For at least 3 days, or for as long as told by your doctor, avoid: Douching. Using tampons. Having sex. Return to your normal activities as told by your doctor. Ask your doctor what activities are safe for you. General instructions Ask your doctor if you may take baths, swim, or use a hot tub. You may take showers. If you use birth control (contraception), keep using it. Keep all follow-up visits. Contact a doctor if: You have a fever or chills. You faint or feel light-headed. Get help right away if: You bleed a lot from your vagina. A lot of bleeding means that the bleeding soaks through a pad in less than 1 hour. You have clumps of blood (blood clots) coming from your vagina. You have signs that could mean you have an infection. This may be  fluid coming from your vagina that is: Different than normal. Yellow. Bad-smelling. You have very bad pain or cramps in your lower belly that do not get better with medicine. Summary If you did not have a sample of your tissue taken out, you may only have some spotting of blood for a few days. You can go back to your normal activities. If you had a sample of your tissue taken out, it is common to have mild pain for a few days and spotting for 48 hours. Avoid douching, using tampons, and having sex for at least 3 days after the procedure or for as long as told. Get help right away if you have a lot of bleeding, very bad pain, or signs of infection. This information is not intended to replace advice given to you by your health care provider. Make sure you discuss any questions you have with your health care provider. Document Revised: 12/25/2020 Document Reviewed: 12/25/2020 Elsevier Patient Education  2024 [employer].

## 2023-09-17 LAB — SURGICAL PATHOLOGY

## 2023-10-03 ENCOUNTER — Ambulatory Visit: Payer: Medicaid Other | Admitting: Obstetrics

## 2023-10-09 ENCOUNTER — Encounter: Payer: Self-pay | Admitting: Obstetrics

## 2023-10-12 DIAGNOSIS — Z419 Encounter for procedure for purposes other than remedying health state, unspecified: Secondary | ICD-10-CM | POA: Diagnosis not present

## 2023-11-14 ENCOUNTER — Ambulatory Visit: Payer: Medicaid Other | Admitting: Dermatology

## 2023-11-23 DIAGNOSIS — Z419 Encounter for procedure for purposes other than remedying health state, unspecified: Secondary | ICD-10-CM | POA: Diagnosis not present

## 2023-12-09 ENCOUNTER — Ambulatory Visit: Admitting: Obstetrics

## 2023-12-09 NOTE — Progress Notes (Deleted)
    GYNECOLOGY PROGRESS NOTE  Subjective:  PCP: Quinton Buckler, FNP  Patient ID: Kristina Mccann, female    DOB: [birth year], 33 y.o.   MRN: [id number]  HPI  Patient is a 33 y.o. G67P2002 female who presents for colpo results from 09/16/23.  {Common ambulatory SmartLinks:19316}  Review of Systems {ros; complete:30496}   Objective:   There were no vitals taken for this visit. There is no height or weight on file to calculate BMI.  General appearance: {general exam:16600} Abdomen: {abdominal exam:16834} Pelvic: {pelvic exam:16852::"cervix normal in appearance","external genitalia normal","no adnexal masses or tenderness","no cervical motion tenderness","rectovaginal septum normal","uterus normal size, shape, and consistency","vagina normal without discharge"} Extremities: {extremity exam:5109} Neurologic: {neuro exam:17854}   Assessment/Plan:   No diagnosis found.   There are no diagnoses linked to this encounter.     Sofia Dunn, DO [facility]

## 2023-12-12 ENCOUNTER — Encounter: Payer: Self-pay | Admitting: Obstetrics

## 2023-12-23 DIAGNOSIS — Z419 Encounter for procedure for purposes other than remedying health state, unspecified: Secondary | ICD-10-CM | POA: Diagnosis not present

## 2024-01-23 DIAGNOSIS — Z419 Encounter for procedure for purposes other than remedying health state, unspecified: Secondary | ICD-10-CM | POA: Diagnosis not present

## 2024-01-29 ENCOUNTER — Ambulatory Visit: Admitting: Dermatology

## 2024-01-29 ENCOUNTER — Encounter: Payer: Self-pay | Admitting: Dermatology

## 2024-01-29 VITALS — BP 173/116 | HR 85

## 2024-01-29 DIAGNOSIS — I10 Essential (primary) hypertension: Secondary | ICD-10-CM

## 2024-01-29 DIAGNOSIS — L732 Hidradenitis suppurativa: Secondary | ICD-10-CM

## 2024-01-29 MED ORDER — DOXYCYCLINE HYCLATE 100 MG PO TABS
100.0000 mg | ORAL_TABLET | Freq: Two times a day (BID) | ORAL | 6 refills | Status: DC
Start: 2024-01-29 — End: 2024-04-24

## 2024-01-29 MED ORDER — SPIRONOLACTONE 100 MG PO TABS
100.0000 mg | ORAL_TABLET | Freq: Every day | ORAL | 11 refills | Status: DC
Start: 2024-01-29 — End: 2024-04-20

## 2024-01-29 MED ORDER — MUPIROCIN 2 % EX OINT
1.0000 | TOPICAL_OINTMENT | Freq: Two times a day (BID) | CUTANEOUS | 11 refills | Status: DC
Start: 2024-01-29 — End: 2024-04-24

## 2024-01-29 NOTE — Progress Notes (Signed)
 New Patient Visit   Subjective  Kristina Mccann is a 33 y.o. female who presents for the following: HS  Patient states she has HS located at the B/L Axilla under breast and groin that she would like to have examined. Patient reports the areas have been there for several years. She reports the areas are bothersome. Patient reports her flares can be painful. Patient rates irritation 10 out of 10. Patient reports she has not previously been treated for these areas.   The following portions of the chart were reviewed this encounter and updated as appropriate: medications, allergies, medical history  Review of Systems:  No other skin or systemic complaints except as noted in HPI or Assessment and Plan.  Objective  Well appearing patient in no apparent distress; mood and affect are within normal limits.  A full examination was performed including scalp, head, eyes, ears, nose, lips, neck, chest, axillae, abdomen, back, buttocks, bilateral upper extremities, bilateral lower extremities, hands, feet, fingers, toes, fingernails, and toenails. All findings within normal limits unless otherwise noted below.   Relevant exam findings are noted in the Assessment and Plan.    Assessment & Plan   HIDRADENITIS SUPPURATIVA Exam: Hurley Stage 1: abscess formation (single or multiple) without sinus tracts with scarring  Not at goal  Hidradenitis Suppurativa is a chronic; persistent; non-curable, but treatable condition due to abnormal inflamed sweat glands in the body folds (axilla, inframammary, groin, medial thighs), causing recurrent painful draining cysts and scarring. It can be associated with severe scarring acne and cysts; also abscesses and scarring of scalp. The goal is control and prevention of flares, as it is not curable. Scars are permanent and can be thickened. Treatment may include daily use of topical medication and oral antibiotics.  Oral isotretinoin may also be helpful.  For some  cases, Humira or Cosentyx (biologic injections) may be prescribed to decrease the inflammatory process and prevent flares.  When indicated, inflamed cysts may also be treated surgically.   - Assessment:  Patient presents with recurrent boils on the inner thigh and arm, consistent with hidradenitis suppurativa (HS). HS is a chronic inflammatory condition affecting apocrine glands in areas such as underarms, breasts, groin, and buttocks. The etiology is likely multifactorial, involving genetic and hormonal factors. The patient reports using natural soaps, which has not provided sufficient relief.  - Plan:    CeraVe benzoyl peroxide wash daily     - Apply to affected areas before showering     - Let sit for 30 seconds, then rinse off    Doxycycline for flares     - Take twice daily for up to 7 days     - May discontinue after 4 days if symptoms improve    Mupirocin ointment for flare areas    Spironolactone     - Take at night     - Informed patient of potential side effects: lightheadedness, dizziness    Continue nifedipine  in the morning    Dietary modification:     - Caution with salt intake, especially in pre-seasoned or microwavable foods    Follow-up in 4 months to assess progress  2. Hypertension - Assessment:  Patient has a history of hypertension, currently managed with nifedipine . The addition of spironolactone for HS management may have the added benefit of addressing hypertension.  - Plan:    Continue nifedipine  in the morning    Monitor blood pressure with the addition of spironolactone    Dietary modification:     -  Caution with salt intake, especially in pre-seasoned or microwavable foods  HIDRADENITIS SUPPURATIVA   Related Medications doxycycline (VIBRA-TABS) 100 MG tablet Take 1 tablet (100 mg total) by mouth 2 (two) times daily. Take for 1 week at the first sign of a flare TAKE WITH HEAVY MEAL spironolactone (ALDACTONE) 100 MG tablet Take 1 tablet (100 mg total)  by mouth daily. mupirocin ointment (BACTROBAN) 2 % Apply 1 Application topically 2 (two) times daily. Apply at first sign of flare to prevent scarring  Return in about 4 months (around 05/30/2024) for HS F/U.  I, Jetta Ager, am acting as Neurosurgeon for [employer], DO.  Documentation: I have reviewed the above documentation for accuracy and completeness, and I agree with the above.  Louana Roup, DO

## 2024-01-29 NOTE — Patient Instructions (Addendum)
 Date: Wed Jan 29 2024  Hello Kristina Mccann,  Thank you for visiting today. Here is a summary of the key instructions:  Diagnosis: Hidradenitis Suppurativa (HS)   - CeraVe Benzoyl Peroxide Wash:   - Apply to affected areas before showering   - Let it sit for 30 seconds   - Rinse off  - Flare Management:   - Take doxycycline twice a day for up to 7 days   - Stop after 4 days if symptoms improve   - Apply mupirocin ointment to flare areas  - Medications:   - Take spironolactone at night   - Continue taking nifedipine  in the morning  - Lifestyle Changes:   - Be careful with salt intake   - Avoid pre-seasoned or microwavable foods  - Follow-up: Return in 4 months for a progress check  Please reach out if you have any questions or concerns.  Warm regards,  Dr. Louana [facility]     Important Information   Due to recent changes in healthcare laws, you may see results of your pathology and/or laboratory studies on MyChart before the doctors have had a chance to review them. We understand that in some cases there may be results that are confusing or concerning to you. Please understand that not all results are received at the same time and often the doctors may need to interpret multiple results in order to provide you with the best plan of care or course of treatment. Therefore, we ask that you please give us  2 business days to thoroughly review all your results before contacting the office for clarification. Should we see a critical lab result, you will be contacted sooner.     If You Need Anything After Your Visit   If you have any questions or concerns for your doctor, please call our main line at [phone]. If no one answers, please leave a voicemail as directed and we will return your call as soon as possible. Messages left after 4 pm will be answered the following business day.    You may also send us  a message via MyChart. We typically respond to MyChart messages  within 1-2 business days.  For prescription refills, please ask your pharmacy to contact our office. Our fax number is [phone].  If you have an urgent issue when the clinic is closed that cannot wait until the next business day, you can page your doctor at the number below.     Please note that while we do our best to be available for urgent issues outside of office hours, we are not available 24/7.    If you have an urgent issue and are unable to reach us , you may choose to seek medical care at your doctor's office, retail clinic, urgent care center, or emergency room.   If you have a medical emergency, please immediately call 911 or go to the emergency department. In the event of inclement weather, please call our main line at [phone] for an update on the status of any delays or closures.  Dermatology Medication Tips: Please keep the boxes that topical medications come in in order to help keep track of the instructions about where and how to use these. Pharmacies typically print the medication instructions only on the boxes and not directly on the medication tubes.   If your medication is too expensive, please contact our office at [phone] or send us  a message through MyChart.    We are unable to tell what your co-pay  for medications will be in advance as this is different depending on your insurance coverage. However, we may be able to find a substitute medication at lower cost or fill out paperwork to get insurance to cover a needed medication.    If a prior authorization is required to get your medication covered by your insurance company, please allow us  1-2 business days to complete this process.   Drug prices often vary depending on where the prescription is filled and some pharmacies may offer cheaper prices.   The website www.goodrx.com contains coupons for medications through different pharmacies. The prices here do not account for what the cost may be with help from  insurance (it may be cheaper with your insurance), but the website can give you the price if you did not use any insurance.  - You can print the associated coupon and take it with your prescription to the pharmacy.  - You may also stop by our office during regular business hours and pick up a GoodRx coupon card.  - If you need your prescription sent electronically to a different pharmacy, notify our office through [facility] or by phone at [phone]

## 2024-02-22 DIAGNOSIS — Z419 Encounter for procedure for purposes other than remedying health state, unspecified: Secondary | ICD-10-CM | POA: Diagnosis not present

## 2024-03-24 DIAGNOSIS — Z419 Encounter for procedure for purposes other than remedying health state, unspecified: Secondary | ICD-10-CM | POA: Diagnosis not present

## 2024-04-17 ENCOUNTER — Ambulatory Visit

## 2024-04-17 VITALS — BP 142/81 | HR 80 | Ht 62.0 in | Wt 218.0 lb

## 2024-04-17 DIAGNOSIS — Z3201 Encounter for pregnancy test, result positive: Secondary | ICD-10-CM

## 2024-04-17 DIAGNOSIS — N912 Amenorrhea, unspecified: Secondary | ICD-10-CM

## 2024-04-17 DIAGNOSIS — Z3200 Encounter for pregnancy test, result unknown: Secondary | ICD-10-CM

## 2024-04-17 LAB — POCT URINE PREGNANCY: Preg Test, Ur: POSITIVE — AB

## 2024-04-17 NOTE — Progress Notes (Signed)
    NURSE VISIT NOTE  Subjective:    Patient ID: Kristina Mccann, female    DOB: 20-Mar-1991, 33 y.o.   MRN: [id number]  HPI  Patient is a 33 y.o. G44P2002 female who presents for evaluation of amenorrhea. She believes she could be pregnant. Patient is ambivalent about pregnancy. Sexual Activity: single partner, contraception: none. Current symptoms also include: breast tenderness, fatigue, frequent urination, nausea, and positive home pregnancy test. Last period was normal.    Objective:    BP (!) 142/81   Pulse 80   Ht 5' 2 (1.575 m)   Wt 218 lb (98.9 kg)   LMP 03/05/2024   BMI 39.87 kg/m   Lab Review  No results found for any visits on 04/17/24.  Assessment:   1. Possible pregnancy     Plan:   Pregnancy Test: Positive  Estimated Date of Delivery: 12/10/24 BP Cuff Measurement taken. Cuff Size Adult Large Encouraged well-balanced diet, plenty of rest when needed, pre-natal vitamins daily and walking for exercise.  Discussed self-help for nausea, avoiding OTC medications until consulting provider or pharmacist, other than Tylenol  as needed, minimal caffeine (1-2 cups daily) and avoiding alcohol.   She will schedule her nurse visit @ 7-[redacted] wks pregnant, u/s for dating @10  wk, and NOB visit at [redacted] wk pregnant.    Feel free to call with any questions.     Harlene Gander, CMA

## 2024-04-20 ENCOUNTER — Telehealth: Payer: Self-pay

## 2024-04-20 NOTE — Telephone Encounter (Signed)
 Brief Telephone Documentation Reason for Call: [employer] - Hypertension  Summary of Call: Patient identified via TNM list for uncontrolled hypertension. Of note, patient had a positive pregnancy test on 04/17/24.   Confirmed patient is not taking spironolactone  over the phone.   Follow Up: PCP: 04/24/24 Pharmacist: 05/11/24  Peyton CHARLENA Ferries, PharmD Clinical Pharmacist [facility] [phone]

## 2024-04-24 ENCOUNTER — Ambulatory Visit (INDEPENDENT_AMBULATORY_CARE_PROVIDER_SITE_OTHER): Admitting: Nurse Practitioner

## 2024-04-24 ENCOUNTER — Encounter: Payer: Self-pay | Admitting: Nurse Practitioner

## 2024-04-24 VITALS — BP 110/68 | HR 109 | Resp 16 | Ht 62.0 in | Wt 212.7 lb

## 2024-04-24 DIAGNOSIS — R0981 Nasal congestion: Secondary | ICD-10-CM

## 2024-04-24 DIAGNOSIS — Z419 Encounter for procedure for purposes other than remedying health state, unspecified: Secondary | ICD-10-CM | POA: Diagnosis not present

## 2024-04-24 DIAGNOSIS — I10 Essential (primary) hypertension: Secondary | ICD-10-CM

## 2024-04-24 DIAGNOSIS — Z13220 Encounter for screening for lipoid disorders: Secondary | ICD-10-CM

## 2024-04-24 DIAGNOSIS — O219 Vomiting of pregnancy, unspecified: Secondary | ICD-10-CM

## 2024-04-24 DIAGNOSIS — Z131 Encounter for screening for diabetes mellitus: Secondary | ICD-10-CM

## 2024-04-24 DIAGNOSIS — Z130 Encounter for screening for diseases of the blood and blood-forming organs and certain disorders involving the immune mechanism: Secondary | ICD-10-CM

## 2024-04-24 MED ORDER — ONDANSETRON 4 MG PO TBDP: 4.0000 mg | ORAL_TABLET | Freq: Three times a day (TID) | ORAL | 0 refills | Status: DC | PRN

## 2024-04-24 MED ORDER — NIFEDIPINE ER OSMOTIC RELEASE 60 MG PO TB24
60.0000 mg | ORAL_TABLET | Freq: Every day | ORAL | 1 refills | Status: AC
Start: 2024-04-24 — End: ?

## 2024-04-24 NOTE — Progress Notes (Signed)
 BP 110/68   Pulse (!) 109   Resp 16   Ht 5' 2 (1.575 m)   Wt 212 lb 11.2 oz (96.5 kg)   LMP 03/05/2024   SpO2 99%   BMI 38.90 kg/m    Subjective:    Patient ID: Kristina Mccann, female    DOB: [birth year], 33 y.o.   MRN: [id number]  HPI: Kristina Mccann is a 33 y.o. female  Chief Complaint  Patient presents with   Medical Management of Chronic Issues    Pt is pregnant just following up to make sure she is on right medication for BP.   Discussed the use of AI scribe software for clinical note transcription with the patient, who gave verbal consent to proceed.  History of Present Illness Kristina Mccann is a 33 year old female with hypertension who presents for a follow-up visit.  Hypertension - Currently taking nifedipine  60 mg daily - Not taking spironolactone  - Monitoring blood pressure at home - No recent in-person follow-up since last year  Pregnancy - Recently discovered pregnancy - Established care with gynecology - Scheduled for a virtual gynecology visit on Monday - Imaging appointment scheduled for October 2nd  Nausea - Experiencing nausea during pregnancy - Prescribed medication for nausea  Sinus congestion - Congestion present - No associated cold symptoms          04/24/2024   10:28 AM 05/23/2023    9:48 AM 04/05/2023    3:11 PM  Depression screen PHQ 2/9  Decreased Interest 0 1 1  Down, Depressed, Hopeless 0 1 1  PHQ - 2 Score 0 2 2  Altered sleeping 0 1 1  Tired, decreased energy 0 1 1  Change in appetite 0 1 3  Feeling bad or failure about yourself  0 0 3  Trouble concentrating 0 1 1  Moving slowly or fidgety/restless 0 0 1  Suicidal thoughts 0 0 0  PHQ-9 Score 0 6 12  Difficult doing work/chores Not difficult at all Not difficult at all Somewhat difficult    Relevant past medical, surgical, family and social history reviewed and updated as indicated. Interim medical history since our last visit reviewed. Allergies and medications  reviewed and updated.  Review of Systems  Ten systems reviewed and is negative except as mentioned in HPI      Objective:      BP 110/68   Pulse (!) 109   Resp 16   Ht 5' 2 (1.575 m)   Wt 212 lb 11.2 oz (96.5 kg)   LMP 03/05/2024   SpO2 99%   BMI 38.90 kg/m    Wt Readings from Last 3 Encounters:  04/24/24 212 lb 11.2 oz (96.5 kg)  04/17/24 218 lb (98.9 kg)  09/16/23 194 lb (88 kg)    Physical Exam VITALS: BP- 110/68 GENERAL: Alert, cooperative, well developed, no acute distress HEENT: Normocephalic, normal oropharynx, moist mucous membranes CHEST: Clear to auscultation bilaterally, no wheezes, rhonchi, or crackles CARDIOVASCULAR: Normal heart rate and rhythm, S1 and S2 normal without murmurs ABDOMEN: Soft, non-tender, non-distended, without organomegaly, normal bowel sounds EXTREMITIES: No cyanosis or edema NEUROLOGICAL: Cranial nerves grossly intact, moves all extremities without gross motor or sensory deficit  Results for orders placed or performed in visit on 04/17/24  POCT urine pregnancy   Collection Time: 04/17/24 10:29 AM  Result Value Ref Range   Preg Test, Ur Positive (A) Negative          Assessment & Plan:  Problem List Items Addressed This Visit       Cardiovascular and Mediastinum   Essential hypertension - Primary   Relevant Medications   NIFEdipine  (PROCARDIA  XL/NIFEDICAL XL) 60 MG 24 hr tablet   Other Relevant Orders   CBC with Differential/Platelet   Comprehensive metabolic panel with GFR   Other Visit Diagnoses       Nausea and vomiting in pregnancy       Relevant Medications   ondansetron  (ZOFRAN -ODT) 4 MG disintegrating tablet   Other Relevant Orders   TSH     Screening for cholesterol level       Relevant Orders   Lipid panel     Screening for diabetes mellitus       Relevant Orders   Comprehensive metabolic panel with GFR   Hemoglobin A1c     Screening for deficiency anemia       Relevant Orders   CBC with  Differential/Platelet     Sinus congestion            Assessment and Plan Assessment & Plan Essential hypertension in pregnancy Blood pressure is well-controlled at 110/68 mmHg on nifedipine  60 mg daily, which is safe during pregnancy. Spironolactone  has been discontinued due to contraindication in pregnancy. - Continue nifedipine  60 mg daily - Refill blood pressure medication as needed  Pregnancy, first trimester Recently diagnosed and currently in the first trimester. Care established with OB/GYN for monitoring and imaging. - Coordinate care with OB/GYN - Cancel September 29th appointment with family practice as OB/GYN will manage care  Nausea and vomiting in pregnancy Experiencing significant nausea. Advised to use prescribed medication only if necessary. - Use prescribed medication for nausea as needed  Nasal congestion in pregnancy Experiencing nasal congestion without signs of a cold. Discussed safe management options during pregnancy. - Use Flonase nasal spray - Take Zyrtec or Claritin for congestion - Ensure adequate hydration        Follow up plan: Return if symptoms worsen or fail to improve.

## 2024-04-27 ENCOUNTER — Telehealth: Payer: Self-pay | Admitting: Certified Nurse Midwife

## 2024-04-27 ENCOUNTER — Telehealth

## 2024-04-27 NOTE — Progress Notes (Deleted)
 New OB Intake  I connected with  Kristina Mccann on 04/27/24 at 11:15 AM EDT by {Contact:24193} Video Visit and verified that I am speaking with the correct person using two identifiers. Nurse is located at [facility] and pt is located at [employer]  I discussed the limitations, risks, security and privacy concerns of performing an evaluation and management service by telephone and the availability of in person appointments. I also discussed with the patient that there may be a patient responsible charge related to this service. The patient expressed understanding and agreed to proceed.  I explained I am completing New OB Intake today. We discussed her EDD of *** that is based on LMP of ***. Pt is G***/P***. I reviewed her allergies, medications, Medical/Surgical/OB history, and appropriate screenings. There are cats in the home: {yes/no:63}. If yes: {Desc; indoor/outdoor:13239}. Based on history, this is a/an pregnancy {Complicated/Uncomplicated Pregnancy:20185} . Her obstetrical history is significant for obesity, prior c-section x 2.  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  Anatomy US  Explained first scheduled US  will be ***. Anatomy US  will be scheduled around [redacted] weeks gestational age.  Labs Discussed genetic screening with patient. Patient *** genetic testing to be drawn at new OB visit. Discussed possible labs to be drawn at new OB appointment.  COVID Vaccine Patient {HAS/HAS NOT:20194} had COVID vaccine.   Social Determinants of Health Food Insecurity: {qnni7:72521} WIC Referral: Patient {ACTION; IS/IS WNU:78978602} interested in referral to [facility].  Transportation: {transportation:25542} Childcare: Discussed no children allowed at ultrasound appointments.   First visit review I reviewed new OB appt with pt. I explained she will have blood work and pap smear/pelvic exam if indicated. Explained pt will be seen by *** at first visit; encounter routed to appropriate provider.   Rollo Kristina [name], RN 04/27/2024  9:33 AM

## 2024-04-27 NOTE — Telephone Encounter (Signed)
 Reached out to pt to reschedule NOB Nurse Intake appt that was originally scheduled on 04/27/2024.  Left message for pt to call back to reschedule.

## 2024-04-29 ENCOUNTER — Encounter: Payer: Self-pay | Admitting: Certified Nurse Midwife

## 2024-04-29 NOTE — Telephone Encounter (Signed)
 Reached out to pt (2x) to reschedule NOB Nurse Intake appt that was originally scheduled on 04/27/2024.  Left message for pt to call back.  Will send a MyChart letter to pt about appt.

## 2024-05-11 ENCOUNTER — Telehealth

## 2024-05-11 ENCOUNTER — Ambulatory Visit

## 2024-05-11 DIAGNOSIS — O10919 Unspecified pre-existing hypertension complicating pregnancy, unspecified trimester: Secondary | ICD-10-CM

## 2024-05-11 DIAGNOSIS — Z3689 Encounter for other specified antenatal screening: Secondary | ICD-10-CM

## 2024-05-11 DIAGNOSIS — O09299 Supervision of pregnancy with other poor reproductive or obstetric history, unspecified trimester: Secondary | ICD-10-CM | POA: Insufficient documentation

## 2024-05-11 DIAGNOSIS — Z98891 History of uterine scar from previous surgery: Secondary | ICD-10-CM | POA: Insufficient documentation

## 2024-05-11 DIAGNOSIS — Z3480 Encounter for supervision of other normal pregnancy, unspecified trimester: Secondary | ICD-10-CM

## 2024-05-11 DIAGNOSIS — O0990 Supervision of high risk pregnancy, unspecified, unspecified trimester: Secondary | ICD-10-CM | POA: Insufficient documentation

## 2024-05-11 NOTE — Patient Instructions (Addendum)
 Severe Morning Sickness: What to Know Severe morning sickness is serious and can happen during pregnancy. You may hear it called hyperemesis gravidarum or HG. In severe morning sickness, you may vomit all day or for many days. You may also feel like vomiting for many days. Severe morning sickness can keep you from eating and drinking enough. This may make you lose too much fluid in your body, or become dehydrated. It can also lead to poor nutrition and weight loss. Severe morning sickness usually happens within the first 20 weeks of pregnancy. After that, it may go away for the rest of your pregnancy, but sometimes it doesn't. What are the causes? The cause of severe morning sickness is not known. It may be linked to: Changes in chemicals called hormones. Changes in the digestive system, such as the esophagus, the stomach, and the bowels. Conditions that are passed in families. What are the signs or symptoms? Very bad vomiting or feeling like you may vomit. These do not get better or go away. Loss of body fluids. No desire for food. Weight loss. How is this diagnosed? Severe morning sickness may be diagnosed based on your medical history, your symptoms, and an exam. Tests that may be done include: Blood tests. Urine tests. Checking blood pressure. How is this treated? You may be told to: Follow an eating plan to lessen vomiting. Eat or drink foods or fluids that have ginger, ginger ale, or ginger tea in them. Use acupressure bracelets or hypnosis. Take medicines. An eating plan and medicines may be used together. If medicines don't help, you may need to get fluids through an IV. This is a small catheter that's put into a vein in your hand or arm. Follow these instructions at home: To help ease your symptoms, listen to your body. Everyone is different. Find what works best for you. Here are some things you can try to help relieve your symptoms: Meals and snacks  Eat 5-6 small meals or  snacks instead of 3 large meals a day. Eat slowly. Before getting out of bed, eat a few crackers. Eat a snack with protein before bed. This could be cheese and crackers, or a peanut butter sandwich made with 1 slice of whole-wheat bread and 1 tsp (5 g) of peanut butter. Try to eat starchy foods as these are usually tolerated well. Examples include toast, bread, pasta, rice, and pretzels. Eat at least one serving of protein with your meals and snacks. Proteins include lean meats, poultry, seafood, beans, nuts, nut butters, eggs, cheese, and yogurt. Fluids It's important to have enough fluid in your body. Try to: Drink small amounts of fluids often. Drink fluids 30 minutes before or after a meal. This will help lessen the feeling of a full stomach. Drink 100% fruit juice or an electrolyte drink. An electrolyte drink contains sodium, potassium, and chloride. Drink fluids that are cold, clear, and carbonated or sour. These include lemonade, ginger ale, lemon-lime soda, ice water, and sparkling water. Add  tsp (0.44 g) ground ginger to hot tea, or drink ginger tea. Other tips that can help Try to avoid: Eating foods that trigger your symptoms. These may include spicy foods, coffee, high-fat foods, and acidic foods. Drinking more than 1 cup of fluid at a time. Skipping meals. The feeling that you may vomit can be worse when your stomach is empty. But if you cannot tolerate food, don't force it. Try sucking on ice chips or other frozen liquids and make up for missed  meals later. Do not lie down for at least 2 hours after eating. Stay away from things that may make you vomit, such as: Food smells. Smoke. Strong smells. Loud noises. Warm, humid areas. Fast movements. General instructions Take your medicines only as told. Continue to take your prenatal vitamins as told. If you're having trouble taking them, talk with your provider about other choices. Talk with your provider about taking vitamin  B6. Brush your teeth or use a mouth rinse after meals. Keep all follow-up visits. Your provider will check on your health and symptoms, as well as your baby's health. Contact a health care provider if: You have pain in your belly. You have a very bad headache. You can't see properly. You feel weak or dizzy. You can't eat or drink without throwing up, especially if this goes on for a full day. You throw up or feel like you may throw up, and the symptoms do not go away. You're losing weight. Get help right away if: There's blood in your vomit. You are very weak or faint. You have a fever and your symptoms suddenly get worse. You feel like your heart is racing or skipping a beat (palpitations). These symptoms may be an emergency. Get help right away. If you can't reach your provider, go to an urgent care or emergency room, or call 911. Do not wait to see if the symptoms will go away. Do not drive yourself to the hospital. This information is not intended to replace advice given to you by your health care provider. Make sure you discuss any questions you have with your health care provider. Document Revised: 01/23/2023 Document Reviewed: 01/23/2023 Elsevier Patient Education  2024 [employer]. How a Baby Grows During Pregnancy Pregnancy starts when a fertilized egg attaches to the lining of the uterus and begins to grow. It is a time of many changes in the mother's body. The changes happen: To support your pregnancy. To help the baby grow. To prepare for the birth of your baby. How long does a pregnancy last? A pregnancy usually lasts 280 days, or about 40 weeks. Pregnancy is divided into three periods of growth, also called trimesters: First trimester: weeks 0-13. Second trimester: weeks 14-27. Third trimester: weeks 28-40. The end of the 40 weeks of pregnancy is your likely date of delivery, or due date. However, most babies are not born on their due date. How does my baby develop  month by month?  First and second month The brain, spinal cord, and heart begin to develop. By 6 weeks, the heart begins to beat. The face, arms, and legs begin to form. Then the hands and feet begin to develop. All major organs begin to develop by the end of the second month. Third month All of the internal organs are forming. Bones and muscles are beginning to grow. The fetus is making movements similar to breathing. Fingernails and toenails are forming. Fourth month The skin is thin and transparent. The neck, outer ear, eyelids, and fingernails are formed. The external sex organs are formed. The fetus can hear, swallow, and move easily. The kidneys begin to make pee (urine). Fifth month The fetus moves around more and can be felt for the first time (quickening). The face, nose, and lips can be seen easily on ultrasound. The baby is covered with soft hair called lanugo. The organs in the digestive system work. Sixth month The lungs continue to grow and mature. The eyes open. The brain continues to develop. The  fetus may begin to suck a finger. Skin ridges are formed that will be fingerprints and toe prints. Hair grows thicker and eyebrows can be seen. Seventh month Lungs are fully developed but not yet ready for birth. Eyes are developed enough to sense changes in light. The fetus responds to sound. The fetus kicks and stretches. Hands can make a grasping motion. Vernix, a waxy coating, is starting to develop to protect the skin. Eighth month Most organs and body systems are fully developed and working. Bones harden, and taste buds develop. The fetus may hiccup. The brain is still developing. The skull remains soft. By week 31, most development is complete and the fetus is gaining weight fast. By the end of week 32, the fetus weighs a little more than 4 pounds (1.8 kg). Ninth month until your due date The lungs are fully developed and ready for birth. Patterns of sleep  develop. The fetus weighs around 6 pounds at the beginning of the ninth month and may weigh around 8 pounds by your due date. The fetus's head typically moves into a head-down position in the uterus. Closer to your due date, the fetus's head may drop lower in your hips. How do I know if my baby is developing well? Always talk with your health care provider about any concerns that you may have about your pregnancy and your baby. At prenatal visits, your provider will do tests to check on your health and keep track of your baby's growth. These include: Fundal height and position. To do this, your provider will: Measure your growing belly from your pubic bone to the top of the uterus using a tape measure. Feel your belly to determine your baby's position. Heartbeat. An ultrasound in the first trimester can confirm pregnancy and show a heartbeat, depending on how far along you are. Your provider will check your baby's heart rate at prenatal visits. You may also have a second trimester ultrasound to check your baby's development. Follow these instructions at home: Take your medicines as told. Take prenatal vitamins as told by your provider. These include vitamins such as folic acid, iron, calcium , and vitamin D . They are important for healthy development of your baby. Keep all follow-up visits. These include prenatal care and screening tests to check your health and your baby's health. This information is not intended to replace advice given to you by your health care provider. Make sure you discuss any questions you have with your health care provider. Document Revised: 12/31/2022 Document Reviewed: 12/31/2022 Elsevier Patient Education  2024 [employer] Questions to Ask Your Health Care Provider During Pregnancy  During pregnancy, you'll go through many changes. These will affect your body as well as your feelings and emotions. And you'll likely have a lot of questions. Your health care team is  a good source for reliable answers. Make an appointment with your team if you're planning to get pregnant or as soon as you know that you're pregnant. New questions will come up as your pregnancy continues. Write your questions down and take them with you to your prenatal visits. Questions to ask about pregnancy Prenatal visits and tests How often will I have my prenatal visits? Should I visit the dentist during pregnancy? What type of screening tests should I consider? What type of routine tests are suggested and when are they done? What are the risks and benefits of these tests? When would you recommend an ultrasound, and what will it show? Is there a nurse line  or office line I can call if I have questions? Caring for yourself during pregnancy How much weight should I gain? What kind of exercise should I get and how much? How much sleep should I get? What kinds of things can I do to help with stress and anxiety? Are there any travel restrictions? Is it OK to have sex? Eating and drinking during pregnancy What vitamins or supplements do you recommend? When should I start taking my prenatal vitamins? What's a healthy diet for me during pregnancy? What foods should I eat? What foods should I not eat? What if I'm on a special diet? Should I limit how much caffeine I have? Vaccinations What shots should I get? When is the best time to get them? Are there shots I should not get? Medicine and substance use during pregnancy Are my current medicines OK to keep taking? Which medicines could hurt me or my baby? Which supplements or herbal medicines could hurt me or my baby? Why is it important not to smoke or drink alcohol? What about other drugs or substances? Where can I get help if I'm having a hard time quitting? Questions to ask about labor and birth Getting ready What are my birth options? Should I make a birth plan? Where can I have my baby? Is a birth center or home birth an  option for me? What are the benefits of breastfeeding? When should I start preparing for breastfeeding? Classes Are there breastfeeding classes or support groups? Where can I find childbirth classes? Pain relief What is natural childbirth? What can I do to decrease pain during labor and birth? What are other methods to help with pain during labor and birth? Birth What is induced labor? What's an episiotomy, and when might I need one? How can I increase my chance for a vaginal birth? When would a C-section be advised? Can I have a vaginal birth if I had a C-section in the past? Other questions to ask How long will I need to stay in the hospital after giving birth? How soon can I get pregnant after giving birth? What are my birth control options? What are the signs of perinatal depression? What should I do if I have depression or anxiety that's getting worse? This information is not intended to replace advice given to you by your health care provider. Make sure you discuss any questions you have with your health care provider. Document Revised: 04/23/2023 Document Reviewed: 04/23/2023 Elsevier Patient Education  2024 [employer] Inc[employer] Common Medications Safe in Pregnancy  Acne:      Constipation:  Benzoyl Peroxide     Colace  Clindamycin      Dulcolax Suppository  Topica Erythromycin     Fibercon  Salicylic Acid      Metamucil         Miralax AVOID:        Senakot   Accutane    Cough:  Retin-A       Cough Drops  Tetracycline      Phenergan w/ Codeine if Rx  Minocycline      Robitussin (Plain & DM)  Antibiotics:     Crabs/Lice:  Ceclor       RID  Cephalosporins    AVOID:  E-Mycins      Kwell  Keflex  Macrobid/Macrodantin   Diarrhea:  Penicillin      Kao-Pectate  Zithromax      Imodium AD         PUSH FLUIDS AVOID:  Cipro     Fever:  Tetracycline      Tylenol  (Regular or Extra  Minocycline       Strength)  Levaquin      Extra Strength-Do not          Exceed 8 tabs/24  hrs Caffeine:        200mg /day (equiv. To 1 cup of coffee or  approx. 3 12 oz sodas)         Gas: Cold/Hayfever:       Gas-X  Benadryl       Mylicon  Claritin       Phazyme  **Claritin-D        Chlor-Trimeton    Headaches:  Dimetapp      ASA-Free Excedrin  Drixoral-Non-Drowsy     Cold Compress  Mucinex (Guaifenasin)     Tylenol  (Regular or Extra  Sudafed/Sudafed-12 Hour     Strength)  **Sudafed PE Pseudoephedrine   Tylenol  Cold & Sinus     Vicks Vapor Rub  Zyrtec  **AVOID if Problems With Blood Pressure         Heartburn: Avoid lying down for at least 1 hour after meals  Aciphex      Maalox     Rash:  Milk of Magnesia     Benadryl     Mylanta       1% Hydrocortisone Cream  Pepcid  Pepcid Complete   Sleep Aids:  Prevacid      Ambien   Prilosec       Benadryl   Rolaids       Chamomile Tea  Tums (Limit 4/day)     Unisom         Tylenol  PM         Warm milk-add vanilla or  Hemorrhoids:       Sugar for taste  Anusol/Anusol H.C.  (RX: Analapram 2.5%)  Sugar Substitutes:  Hydrocortisone OTC     Ok in moderation  Preparation H      Tucks        Vaseline lotion applied to tissue with wiping    Herpes:     Throat:  Acyclovir      Oragel  Famvir  Valtrex      Vaccines:         Flu Shot Leg Cramps:       *Gardasil  Benadryl       Hepatitis A         Hepatitis B Nasal Spray:       Pneumovax  Saline Nasal Spray     Polio Booster         Tetanus Nausea:       Tuberculosis test or PPD  Vitamin B6 25 mg TID   AVOID:    Dramamine      *Gardasil  Emetrol       Live Poliovirus  Ginger Root 250 mg QID    MMR (measles, mumps &  High Complex Carbs @ Bedtime    rebella)  Sea Bands-Accupressure    Varicella (Chickenpox)  Unisom 1/2 tab TID     *No known complications           If received before Pain:         Known pregnancy;   Darvocet       Resume series after  Lortab        Delivery  Percocet    Yeast:   Tramadol      Femstat  Tylenol   3  Gyne-lotrimin  Ultram       Monistat  Vicodin           MISC:         All Sunscreens           Hair Coloring/highlights          Insect Repellant's          (Including DEET)         Mystic Tans  Commonly Asked Questions During Pregnancy  Cats: A parasite can be excreted in cat feces.  To avoid exposure you need to have another person empty the little box.  If you must empty the litter box you will need to wear gloves.  Wash your hands after handling your cat.  This parasite can also be found in raw or undercooked meat so this should also be avoided.  Colds, Sore Throats, Flu: Please check your medication sheet to see what you can take for symptoms.  If your symptoms are unrelieved by these medications please call the office.  Dental Work: Most any dental work Agricultural consultant recommends is permitted.  X-rays should only be taken during the first trimester if absolutely necessary.  Your abdomen should be shielded with a lead apron during all x-rays.  Please notify your provider prior to receiving any x-rays.  Novocaine is fine; gas is not recommended.  If your dentist requires a note from us  prior to dental work please call the office and we will provide one for you.  Exercise: Exercise is an important part of staying healthy during your pregnancy.  You may continue most exercises you were accustomed to prior to pregnancy.  Later in your pregnancy you will most likely notice you have difficulty with activities requiring balance like riding a bicycle.  It is important that you listen to your body and avoid activities that put you at a higher risk of falling.  Adequate rest and staying well hydrated are a must!  If you have questions about the safety of specific activities ask your provider.    Exposure to Children with illness: Try to avoid obvious exposure; report any symptoms to us  when noted,  If you have chicken pos, red measles or mumps, you should be immune to these diseases.   Please do  not take any vaccines while pregnant unless you have checked with your OB provider.  Fetal Movement: After 28 weeks we recommend you do kick counts twice daily.  Lie or sit down in a calm quiet environment and count your baby movements kicks.  You should feel your baby at least 10 times per hour.  If you have not felt 10 kicks within the first hour get up, walk around and have something sweet to eat or drink then repeat for an additional hour.  If count remains less than 10 per hour notify your provider.  Fumigating: Follow your pest control agent's advice as to how long to stay out of your home.  Ventilate the area well before re-entering.  Hemorrhoids:   Most over-the-counter preparations can be used during pregnancy.  Check your medication to see what is safe to use.  It is important to use a stool softener or fiber in your diet and to drink lots of liquids.  If hemorrhoids seem to be getting worse please call the office.   Hot Tubs:  Hot tubs Jacuzzis and saunas are not recommended while pregnant.  These increase your internal body temperature and should be avoided.  Intercourse:  Sexual intercourse is safe during pregnancy as long as you are comfortable, unless otherwise advised by your provider.  Spotting may occur after intercourse; report any bright red bleeding that is heavier than spotting.  Labor:  If you know that you are in labor, please go to the hospital.  If you are unsure, please call the office and let us  help you decide what to do.  Lifting, straining, etc:  If your job requires heavy lifting or straining please check with your provider for any limitations.  Generally, you should not lift items heavier than that you can lift simply with your hands and arms (no back muscles)  Painting:  Paint fumes do not harm your pregnancy, but may make you ill and should be avoided if possible.  Latex or water based paints have less odor than oils.  Use adequate ventilation while  painting.  Permanents & Hair Color:  Chemicals in hair dyes are not recommended as they cause increase hair dryness which can increase hair loss during pregnancy.   Highlighting and permanents are allowed.  Dye may be absorbed differently and permanents may not hold as well during pregnancy.  Sunbathing:  Use a sunscreen, as skin burns easily during pregnancy.  Drink plenty of fluids; avoid over heating.  Tanning Beds:  Because their possible side effects are still unknown, tanning beds are not recommended.  Ultrasound Scans:  Routine ultrasounds are performed at approximately 20 weeks.  You will be able to see your baby's general anatomy an if you would like to know the gender this can usually be determined as well.  If it is questionable when you conceived you may also receive an ultrasound early in your pregnancy for dating purposes.  Otherwise ultrasound exams are not routinely performed unless there is a medical necessity.  Although you can request a scan we ask that you pay for it when conducted because insurance does not cover  patient request scans.  Work: If your pregnancy proceeds without complications you may work until your due date, unless your physician or employer advises otherwise.  Round Ligament Pain/Pelvic Discomfort:  Sharp, shooting pains not associated with bleeding are fairly common, usually occurring in the second trimester of pregnancy.  They tend to be worse when standing up or when you remain standing for long periods of time.  These are the result of pressure of certain pelvic ligaments called round ligaments.  Rest, Tylenol  and heat seem to be the most effective relief.  As the womb and fetus grow, they rise out of the pelvis and the discomfort improves.  Please notify the office if your pain seems different than that described.  It may represent a more serious condition.   First Trimester of Pregnancy  The first trimester of pregnancy starts on the first day of  your last monthly period until the end of week 13. This is months 1 through 3 of pregnancy. A week after a sperm fertilizes an egg, the egg will implant into the wall of the uterus and begin to develop into a baby. Body changes during your first trimester Your body goes through many changes during pregnancy. The changes usually return to normal after your baby is born. Physical changes Your breasts may grow larger and may hurt. The area around your nipples may get darker. Your periods will stop. Your hair and nails may grow faster. You may pee more often. Health changes You may tire easily. Your gums may bleed and may be sensitive when you  brush and floss. You may not feel hungry. You may have heartburn. You may throw up or feel like you may throw up. You may want to eat some foods, but not others. You may have headaches. You may have trouble pooping (constipation). Other changes Your emotions may change from day to day. You may have more dreams. Follow these instructions at home: Medicines Talk to your health care provider if you're taking medicines. Ask if the medicines are safe to take during pregnancy. Your provider may change the medicines that you take. Do not take any medicines unless told to by your provider. Take a prenatal vitamin that has at least 600 micrograms (mcg) of folic acid. Do not use herbal medicines, illegal substances, or medicines that are not approved by your provider. Eating and drinking While you're pregnant your body needs extra food for your growing baby. Talk with your provider about what to eat while pregnant. Activity Most women are able to exercise during pregnancy. Exercises may need to change as your pregnancy goes on. Talk to your provider about your activities and exercise routines. Relieving pain and discomfort Wear a good, supportive bra if your breasts hurt. Rest with your legs raised if you have leg cramps or low back pain. Safety Wear your  seatbelt at all times when you're in a car. Talk to your provider if someone hits you, hurts you, or yells at you. Talk with your provider if you're feeling sad or have thoughts of hurting yourself. Lifestyle Certain things can be harmful while you're pregnant. Follow these rules: Do not use hot tubs, steam rooms, or saunas. Do not douche. Do not use tampons or scented pads. Do not drink alcohol,smoke, vape, or use products with nicotine or tobacco in them. If you need help quitting, talk with your provider. Avoid cat litter boxes and soil used by cats. These things carry germs that can cause harm to your pregnancy and your baby. General instructions Keep all follow-up visits. It helps you and your unborn baby stay as healthy as possible. Write down your questions. Take them to your visits. Your provider will: Talk with you about your overall health. Give you advice or refer you to specialists who can help with different needs, including: Prenatal education classes. Mental health and counseling. Foods and healthy eating. Ask for help if you need help with food. Call your dentist and ask to be seen. Brush your teeth with a soft toothbrush. Floss gently. Where to find more information American Pregnancy Association: americanpregnancy.org [employer] of Obstetricians and Gynecologists: acog.org Office on [employer] Health: TravelLesson.ca Contact a health care provider if: You feel dizzy, faint, or have a fever. You vomit or have watery poop (diarrhea) for 2 days or more. You have abnormal discharge or bleeding from your vagina. You have pain when you pee or your pee smells bad. You have cramps, pain, or pressure in your belly area. Get help right away if: You have trouble breathing or chest pain. You have any kind of injury, such as from a fall or a car crash. These symptoms may be an emergency. Get help right away. Call 911. Do not wait to see if the symptoms will go away. Do not  drive yourself to the hospital. This information is not intended to replace advice given to you by your health care provider. Make sure you discuss any questions you have with your health care provider. Document Revised: 05/02/2023 Document Reviewed: 11/30/2022 Elsevier Patient Education  2024 [employer].

## 2024-05-11 NOTE — Progress Notes (Signed)
 New OB Intake  I connected with  Kristina Mccann on 05/11/24 at  3:15 PM EDT by MyChart Video Visit and verified that I am speaking with the correct person using two identifiers. Nurse is located at [facility] and pt is located at home.  I discussed the limitations, risks, security and privacy concerns of performing an evaluation and management service by telephone and the availability of in person appointments. I also discussed with the patient that there may be a patient responsible charge related to this service. The patient expressed understanding and agreed to proceed.  I explained I am completing New OB Intake today. We discussed her EDD of 12/10/24 that is based on LMP of 03/05/24. Pt is G3/P2002. I reviewed her allergies, medications, Medical/Surgical/OB history, and appropriate screenings. There are no cats in the home in the home.  Based on history, this is a/an pregnancy complicated by hypertension and Obesity .   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  Anatomy US  Explained first scheduled US  is scheduled for 05/14/24.  Anatomy US  scheduled will be around 19 weeks.  Labs Discussed genetic screening with patient.  Patient desires genetic testing to be drawn at new OB visit. Discussed possible labs to be drawn at new OB appointment.  COVID Vaccine Patient has had COVID vaccine.   Social Determinants of Health Food Insecurity: denies food insecurity WIC Referral: Patient is interested in referral to [facility]  Transportation: Patient denies transportation needs. Childcare: Discussed no children allowed at ultrasound appointments.   First visit review I reviewed new OB appt with pt. I explained she will have bloodwork and pap smear/pelvic exam if indicated. Explained pt will be seen by Eleanor Canny, CNM at first visit; encounter routed to appropriate provider.   [place], [place] 0/70/7974  6:42 PM

## 2024-05-12 ENCOUNTER — Other Ambulatory Visit: Payer: Self-pay | Admitting: Nurse Practitioner

## 2024-05-12 DIAGNOSIS — O219 Vomiting of pregnancy, unspecified: Secondary | ICD-10-CM

## 2024-05-14 ENCOUNTER — Ambulatory Visit (INDEPENDENT_AMBULATORY_CARE_PROVIDER_SITE_OTHER)

## 2024-05-14 DIAGNOSIS — Z3481 Encounter for supervision of other normal pregnancy, first trimester: Secondary | ICD-10-CM

## 2024-05-14 DIAGNOSIS — Z3200 Encounter for pregnancy test, result unknown: Secondary | ICD-10-CM

## 2024-05-14 DIAGNOSIS — Z3A09 9 weeks gestation of pregnancy: Secondary | ICD-10-CM | POA: Diagnosis not present

## 2024-05-14 NOTE — Telephone Encounter (Signed)
 Requested medication (s) are due for refill today: yes  Requested medication (s) are on the active medication list: yes  Last refill:  04/24/24  Future visit scheduled: no  Notes to clinic:  Unable to refill per protocol, cannot delegate.      Requested Prescriptions  Pending Prescriptions Disp Refills   ondansetron  (ZOFRAN -ODT) 4 MG disintegrating tablet [Pharmacy Med Name: ONDANSETRON  ODT 4 MG TABLET] 20 tablet 0    Sig: TAKE 1 TABLET BY MOUTH EVERY 8 HOURS AS NEEDED FOR NAUSEA AND VOMITING     Not Delegated - Gastroenterology: Antiemetics - ondansetron  Failed - 05/14/2024  1:07 PM      Failed - This refill cannot be delegated      Failed - AST in normal range and within 360 days    AST  Date Value Ref Range Status  01/24/2023 24 10 - 30 U/L Final   SGOT(AST)  Date Value Ref Range Status  01/06/2013 12 (L) 15 - 37 Unit/L Final         Failed - ALT in normal range and within 360 days    ALT  Date Value Ref Range Status  01/24/2023 15 6 - 29 U/L Final   SGPT (ALT)  Date Value Ref Range Status  01/06/2013 15 12 - 78 U/L Final         Passed - Valid encounter within last 6 months    Recent Outpatient Visits           2 weeks ago Essential hypertension   [facility] Gareth Mliss [place], [place]

## 2024-05-22 NOTE — Progress Notes (Signed)
 NEW OB HISTORY AND PHYSICAL  SUBJECTIVE:       Kristina Mccann is a 33 y.o. G11P2002 female, Patient's last menstrual period was 03/05/2024 (exact date)., Estimated Date of Delivery: 12/10/24, [redacted]w[redacted]d, presents today for establishment of Prenatal Care. She reports indigestion/heartburn and shoulder and hip pain. She has a h/o 2 prior CS. The first was for primary HSV infection. The second was for preeclampsia. She desires a TOLAC.   Social history Partner/Relationship: partner Antonio Living situation: lives with [place] and children. Feels safe there Work: Conservation officer, nature Exercise: walking Substance use: THC   Gynecologic History Patient's last menstrual period was 03/05/2024 (exact date).  Contraception: none Last Pap: 09/03/23. Results were: LSIL, +HPV. Normal colpo.  Obstetric History OB History  Gravida Para Term Preterm AB Living  3 2 2   2   SAB IAB Ectopic Multiple Live Births     0 2    # Outcome Date GA Lbr Len/2nd Weight Sex Type Anes PTL Lv  3 Current           2 Term 02/15/21 [redacted]w[redacted]d  5 lb 6.1 oz (2.44 kg) F CS-LTranv Spinal  LIV  1 Term 03/23/11 [redacted]w[redacted]d  6 lb 6 oz (2.892 kg) F CS-Unspec Gen  LIV    Obstetric Comments  Pre-E w/G2.    Past Medical History:  Diagnosis Date   Acanthosis nigricans    Allergy    Anxiety    Depression    Dysmenorrhea    GERD (gastroesophageal reflux disease)    History of suicide attempt    Migraine without status migrainosus, not intractable    Obesity     Past Surgical History:  Procedure Laterality Date   BREAST LUMPECTOMY Left 08/13/2006   CESAREAN SECTION  03/23/2011   CESAREAN SECTION  02/15/2021   Procedure: CESAREAN SECTION;  Surgeon: Leonce Garnette BIRCH, MD;  Location: [facility] ORS;  Service: Obstetrics;;    Current Outpatient Medications on File Prior to Visit  Medication Sig Dispense Refill   NIFEdipine  (PROCARDIA  XL/NIFEDICAL XL) 60 MG 24 hr tablet Take 1 tablet (60 mg total) by mouth daily. 90 tablet 1   ondansetron   (ZOFRAN -ODT) 4 MG disintegrating tablet TAKE 1 TABLET BY MOUTH EVERY 8 HOURS AS NEEDED FOR NAUSEA AND VOMITING 20 tablet 0   Prenatal Vit-Fe Fumarate-FA (MULTIVITAMIN-PRENATAL) 27-0.8 MG TABS tablet Take 1 tablet by mouth daily at 12 noon.     No current facility-administered medications on file prior to visit.    No Known Allergies  Social History   Socioeconomic History   Marital status: Single    Spouse name: Not on file   Number of children: 2   Years of education: 13   Highest education level: Some college, no degree  Occupational History   Occupation: Not Employed  Tobacco Use   Smoking status: Former    Types: E-cigarettes    Quit date: 04/08/2024    Years since quitting: 0.1    Passive exposure: Current   Smokeless tobacco: Never  Vaping Use   Vaping status: Some Days  Substance and Sexual Activity   Alcohol use: Not Currently    Comment: occa   Drug use: Not Currently    Types: Marijuana    Comment: 1-2x day/stopped w/+hpt   Sexual activity: Yes    Partners: Male  Other Topics Concern   Not on file  Social History Narrative   Not on file   Social Drivers of Health   Financial Resource Strain: Patient Declined (04/23/2024)  Overall Financial Resource Strain (CARDIA)    Difficulty of Paying Living Expenses: Patient declined  Food Insecurity: Patient Declined (04/23/2024)   Hunger Vital Sign    Worried About Running Out of Food in the Last Year: Patient declined    Ran Out of Food in the Last Year: Patient declined  Transportation Needs: No Transportation Needs (04/23/2024)   PRAPARE - Administrator, Civil Service (Medical): No    Lack of Transportation (Non-Medical): No  Physical Activity: Unknown (04/23/2024)   Exercise Vital Sign    Days of Exercise per Week: Patient declined    Minutes of Exercise per Session: Not on file  Stress: No Stress Concern Present (04/23/2024)   [employer] of Occupational Health - Occupational Stress  Questionnaire    Feeling of Stress: Only a little  Social Connections: Unknown (04/23/2024)   Social Connection and Isolation Panel    Frequency of Communication with Friends and Family: More than three times a week    Frequency of Social Gatherings with Friends and Family: Patient declined    Attends Religious Services: Patient declined    Database administrator or Organizations: Patient declined    Attends Banker Meetings: Not on file    Marital Status: Never married  Intimate Partner Violence: Not At Risk (05/11/2024)   Humiliation, Afraid, Rape, and Kick questionnaire    Fear of Current or Ex-Partner: No    Emotionally Abused: No    Physically Abused: No    Sexually Abused: No    Family History  Problem Relation Age of Onset   Migraines Mother    Stroke Father    Diabetes Father    Obesity Father    Hypertension Father    Asthma Brother    Asthma Brother    Asthma Brother    Diabetes Maternal Grandmother    Hypertension Maternal Grandmother     The following portions of the patient's history were reviewed and updated as appropriate: allergies, current medications, past OB history, past medical history, past surgical history, past family history, past social history, and problem list.  Constitutional: Denied constitutional symptoms, night sweats, recent illness, fatigue, fever, insomnia and weight loss.  Eyes: Denied eye symptoms, eye pain, photophobia, vision change and visual disturbance.  Ears/Nose/Throat/Neck: Denied ear, nose, throat or neck symptoms, hearing loss, nasal discharge, sinus congestion and sore throat.  Cardiovascular: Denied cardiovascular symptoms, arrhythmia, chest pain/pressure, edema, exercise intolerance, orthopnea and palpitations.  Respiratory: Denied pulmonary symptoms, asthma, pleuritic pain, productive sputum, cough, dyspnea and wheezing.  Gastrointestinal: Heartburn  Genitourinary: Denied genitourinary symptoms including symptomatic  vaginal discharge, pelvic relaxation issues, and urinary complaints.  Musculoskeletal: Denied musculoskeletal symptoms, stiffness, swelling, muscle weakness and myalgia.  Dermatologic: Denied dermatology symptoms, rash and scar.  Neurologic: Denied neurology symptoms, dizziness, headache, neck pain and syncope.  Psychiatric: Denied psychiatric symptoms, anxiety and depression.  Endocrine: Denied endocrine symptoms including hot flashes and night sweats.    Indications for ASA therapy (per uptodate) One of the following: Previous pregnancy with preeclampsia, especially early onset and with an adverse outcome Yes Multifetal gestation No Chronic hypertension Yes Type 1 or 2 diabetes mellitus No Chronic kidney disease No Autoimmune disease (antiphospholipid syndrome, systemic lupus erythematosus) No  Two or more of the following: Nulliparity No Obesity (body mass index >30 kg/m2) Yes Family history of preeclampsia in mother or sister No Age >=35 years No Sociodemographic characteristics (African American race, low socioeconomic level) Yes Personal risk factors (eg, previous pregnancy with  low birth weight or small for gestational age infant, previous adverse pregnancy outcome [eg, stillbirth], interval >10 years between pregnancies) No   OBJECTIVE: Initial Physical Exam (New OB)  GENERAL APPEARANCE: alert, well appearing HEAD: normocephalic, atraumatic MOUTH: mucous membranes moist, pharynx normal without lesions THYROID : no thyromegaly or masses present BREASTS: no masses noted, no significant tenderness, no palpable axillary nodes, no skin changes LUNGS: clear to auscultation, no wheezes, rales or rhonchi, symmetric air entry HEART: regular rate and rhythm, no murmurs ABDOMEN: soft, nontender, nondistended, no abnormal masses, no epigastric pain and FHT present EXTREMITIES: no redness or tenderness in the calves or thighs SKIN: normal coloration and turgor, no rashes LYMPH NODES:  no adenopathy palpable NEUROLOGIC: alert, oriented, normal speech, no focal findings or movement disorder noted  PELVIC EXAM declined  ASSESSMENT: Normal pregnancy [redacted]w[redacted]d H/o preE H/o HSV H/o CS x 2   PLAN: Routine prenatal care. We discussed an overview of prenatal care and when to call. Reviewed diet, exercise, and weight gain recommendations in pregnancy. Discussed benefits of breastfeeding and lactation resources at [facility]. I answered all questions. Labs and genetic screening today. H/o preE Baseline labs ordered LDASA ordered H/o HSV No outbreaks since primary outbreak. Start Valtrex  at 36 weeks H/o CS x 2 Schedule appt with MD for TOLAC counseling in 3rd trimester  See orders  Eleanor Canny, CNM

## 2024-05-28 ENCOUNTER — Other Ambulatory Visit (HOSPITAL_COMMUNITY)
Admission: RE | Admit: 2024-05-28 | Discharge: 2024-05-28 | Disposition: A | Discharge status: Home / Self Care | Source: Ambulatory Visit | Attending: Obstetrics | Admitting: Obstetrics

## 2024-05-28 ENCOUNTER — Encounter: Payer: Self-pay | Admitting: Obstetrics

## 2024-05-28 ENCOUNTER — Ambulatory Visit (INDEPENDENT_AMBULATORY_CARE_PROVIDER_SITE_OTHER): Admitting: Obstetrics

## 2024-05-28 ENCOUNTER — Encounter: Admitting: Certified Nurse Midwife

## 2024-05-28 VITALS — BP 119/76 | HR 80 | Wt 217.0 lb

## 2024-05-28 DIAGNOSIS — Z0283 Encounter for blood-alcohol and blood-drug test: Secondary | ICD-10-CM | POA: Insufficient documentation

## 2024-05-28 DIAGNOSIS — O0991 Supervision of high risk pregnancy, unspecified, first trimester: Secondary | ICD-10-CM | POA: Diagnosis not present

## 2024-05-28 DIAGNOSIS — Z131 Encounter for screening for diabetes mellitus: Secondary | ICD-10-CM

## 2024-05-28 DIAGNOSIS — O0990 Supervision of high risk pregnancy, unspecified, unspecified trimester: Secondary | ICD-10-CM

## 2024-05-28 DIAGNOSIS — I159 Secondary hypertension, unspecified: Secondary | ICD-10-CM | POA: Insufficient documentation

## 2024-05-28 DIAGNOSIS — Z13220 Encounter for screening for lipoid disorders: Secondary | ICD-10-CM | POA: Insufficient documentation

## 2024-05-28 DIAGNOSIS — Z0184 Encounter for antibody response examination: Secondary | ICD-10-CM | POA: Insufficient documentation

## 2024-05-28 DIAGNOSIS — Z1379 Encounter for other screening for genetic and chromosomal anomalies: Secondary | ICD-10-CM | POA: Insufficient documentation

## 2024-05-28 DIAGNOSIS — Z113 Encounter for screening for infections with a predominantly sexual mode of transmission: Secondary | ICD-10-CM | POA: Diagnosis not present

## 2024-05-28 DIAGNOSIS — D649 Anemia, unspecified: Secondary | ICD-10-CM | POA: Diagnosis not present

## 2024-05-28 DIAGNOSIS — R7689 Other specified abnormal immunological findings in serum: Secondary | ICD-10-CM | POA: Insufficient documentation

## 2024-05-28 DIAGNOSIS — Z3A12 12 weeks gestation of pregnancy: Secondary | ICD-10-CM

## 2024-05-28 MED ORDER — ASPIRIN 81 MG PO TBEC: 81.0000 mg | DELAYED_RELEASE_TABLET | Freq: Every day | ORAL | 2 refills | Status: AC

## 2024-05-29 LAB — CBC/D/PLT+RPR+RH+ABO+RUBIGG...
Antibody Screen: NEGATIVE
Basophils Absolute: 0 x10E3/uL (ref 0.0–0.2)
Basos: 0 %
EOS (ABSOLUTE): 0.2 x10E3/uL (ref 0.0–0.4)
Eos: 2 %
HCV Ab: NONREACTIVE
HIV Screen 4th Generation wRfx: NONREACTIVE
Hematocrit: 35.1 % (ref 34.0–46.6)
Hemoglobin: 11.6 g/dL (ref 11.1–15.9)
Hepatitis B Surface Ag: NEGATIVE
Immature Grans (Abs): 0 x10E3/uL (ref 0.0–0.1)
Immature Granulocytes: 0 %
Lymphocytes Absolute: 1.5 x10E3/uL (ref 0.7–3.1)
Lymphs: 23 %
MCH: 28.1 pg (ref 26.6–33.0)
MCHC: 33 g/dL (ref 31.5–35.7)
MCV: 85 fL (ref 79–97)
Monocytes Absolute: 0.4 x10E3/uL (ref 0.1–0.9)
Monocytes: 7 %
Neutrophils Absolute: 4.4 x10E3/uL (ref 1.4–7.0)
Neutrophils: 68 %
Platelets: 361 x10E3/uL (ref 150–450)
RBC: 4.13 x10E6/uL (ref 3.77–5.28)
RDW: 14.7 % (ref 11.7–15.4)
RPR Ser Ql: NONREACTIVE
Rh Factor: POSITIVE
Rubella Antibodies, IGG: 1.09 {index} (ref >0.99)
Varicella zoster IgG: NONREACTIVE
WBC: 6.5 x10E3/uL (ref 3.4–10.8)

## 2024-05-29 LAB — URINALYSIS, ROUTINE W REFLEX MICROSCOPIC
Bilirubin, UA: NEGATIVE
Glucose, UA: NEGATIVE
Nitrite, UA: POSITIVE — AB
Specific Gravity, UA: 1.023 (ref 1.005–1.030)
Urobilinogen, Ur: 1 mg/dL (ref 0.2–1.0)
pH, UA: 5.5 (ref 5.0–7.5)

## 2024-05-29 LAB — CERVICOVAGINAL ANCILLARY ONLY
Chlamydia: NEGATIVE
Comment: NEGATIVE
Comment: NORMAL
Neisseria Gonorrhea: NEGATIVE

## 2024-05-29 LAB — MICROSCOPIC EXAMINATION
Casts: NONE SEEN /LPF
RBC, Urine: >30 /HPF — AB (ref 0–2)

## 2024-05-29 LAB — PROTEIN / CREATININE RATIO, URINE
Creatinine, Urine: 293 mg/dL
Protein, Ur: 108.2 mg/dL
Protein/Creat Ratio: 369 mg/g{creat} — ABNORMAL HIGH (ref 0–200)

## 2024-05-29 LAB — HCV INTERPRETATION

## 2024-05-30 LAB — CULTURE, OB URINE

## 2024-05-30 LAB — URINE CULTURE, OB REFLEX

## 2024-06-03 LAB — COMPREHENSIVE METABOLIC PANEL WITH GFR
ALT: 7 IU/L (ref 0–32)
AST: 13 IU/L (ref 0–40)
Albumin: 3.7 g/dL — ABNORMAL LOW (ref 3.9–4.9)
Alkaline Phosphatase: 83 IU/L (ref 41–116)
BUN/Creatinine Ratio: 8 — ABNORMAL LOW (ref 9–23)
BUN: 5 mg/dL — ABNORMAL LOW (ref 6–20)
Bilirubin Total: 0.3 mg/dL (ref 0.0–1.2)
CO2: 19 mmol/L — ABNORMAL LOW (ref 20–29)
Calcium: 8.8 mg/dL (ref 8.7–10.2)
Chloride: 99 mmol/L (ref 96–106)
Creatinine, Ser: 0.63 mg/dL (ref 0.57–1.00)
Globulin, Total: 3.2 g/dL (ref 1.5–4.5)
Glucose: 80 mg/dL (ref 70–99)
Potassium: 3.7 mmol/L (ref 3.5–5.2)
Sodium: 134 mmol/L (ref 134–144)
Total Protein: 6.9 g/dL (ref 6.0–8.5)
eGFR: 121 mL/min/1.73 (ref >59)

## 2024-06-03 LAB — MATERNIT 21 PLUS CORE, BLOOD
Fetal Fraction: 10
Result (T21): NEGATIVE
Trisomy 13 (Patau syndrome): NEGATIVE
Trisomy 18 (Edwards syndrome): NEGATIVE
Trisomy 21 (Down syndrome): NEGATIVE

## 2024-06-03 LAB — TSH+FREE T4
Free T4: 0.83 ng/dL (ref 0.82–1.77)
TSH: 0.779 u[IU]/mL (ref 0.450–4.500)

## 2024-06-03 LAB — HEMOGLOBIN A1C
Est. average glucose Bld gHb Est-mCnc: 111 mg/dL
Hgb A1c MFr Bld: 5.5 % (ref 4.8–5.6)

## 2024-06-04 ENCOUNTER — Ambulatory Visit

## 2024-06-04 LAB — MONITOR DRUG PROFILE 14(MW)

## 2024-06-04 LAB — NICOTINE SCREEN, URINE

## 2024-06-17 ENCOUNTER — Ambulatory Visit: Admitting: Dermatology

## 2024-06-24 NOTE — Progress Notes (Deleted)
    Return Prenatal Note   Subjective   33 y.o. H6E7997 at [redacted]w[redacted]d presents for this follow-up prenatal visit.  Patient *** Patient reports:    Objective   Flow sheet Vitals:   Total weight gain: 22 lb (9.979 kg)  General Appearance  No acute distress, well appearing, and well nourished Pulmonary   Normal work of breathing Neurologic   Alert and oriented to person, place, and time Psychiatric   Mood and affect within normal limits   Assessment/Plan   Plan  33 y.o. [id number] at [redacted]w[redacted]d presents for follow-up OB visit. Reviewed prenatal record including previous visit note.  No problem-specific Assessment & Plan notes found for this encounter.      No orders of the defined types were placed in this encounter.  No follow-ups on file.   Future Appointments  Date Time Provider Department Center  06/25/2024  3:55 PM Slaughterbeck, Damien, CNM [facility] None    For next visit:  continue with routine prenatal care     Damien Parsley, CNM [facility] 06/24/2509:36 AM

## 2024-06-24 NOTE — Patient Instructions (Incomplete)
 Second Trimester of Pregnancy  The second trimester of pregnancy is from week 14 through week 27. This is months 4 through 6 of pregnancy. During the second trimester: Morning sickness is less or has stopped. You may have more energy. You may feel hungry more often. At this time, your unborn baby is growing very fast. At the end of the sixth month, the unborn baby may be up to 12 inches long and weigh about 1 pounds. You will likely start to feel the baby move between 16 and 20 weeks of pregnancy. Body changes during your second trimester Your body continues to change during this time. The changes usually go away after your baby is born. Physical changes You will gain more weight. Your belly will get bigger. You may begin to get stretch marks on your hips, belly, and breasts. Your breasts will keep growing and may hurt. You may get dark spots or blotches on your face. A dark line from your belly button to the pubic area may appear. This line is called linea nigra. Your hair may grow faster and get thicker. Health changes You may have headaches. You may have heartburn. You may pee more often. You may have swollen, bulging veins (varicose veins). You may have trouble pooping (constipation), or swollen veins in the butt that can itch or get painful (hemorrhoids). You may have back pain. This is caused by: Weight gain. Pregnancy hormones that are relaxing the joints in your pelvis. Follow these instructions at home: Medicines Talk to your health care provider if you're taking medicines. Ask if the medicines are safe to take during pregnancy. Your provider may change the medicines that you take. Do not take any medicines unless told to by your provider. Take a prenatal vitamin that has at least 600 micrograms (mcg) of folic acid. Do not use herbal medicines, illegal drugs, or medicines that are not approved by your provider. Eating and drinking While you're pregnant your body needs  extra food for your growing baby. Talk with your provider about what to eat while pregnant. Activity Most women are able to exercise during pregnancy. Exercises may need to change as your pregnancy goes on. Talk to your provider about your activities and exercise routines. Relieving pain and discomfort Wear a good, supportive bra if your breasts hurt. Rest with your legs raised if you have leg cramps or low back pain. Take warm sitz baths to soothe pain from hemorrhoids. Use hemorrhoid cream if your provider says it's okay. Do not douche. Do not use tampons or scented pads. Do not use hot tubs, steam rooms, or saunas. Safety Wear your seatbelt at all times when you're in a car. Talk to your provider if someone hits you, hurts you, or yells at you. Talk with your provider if you're feeling sad or have thoughts of hurting yourself. Lifestyle Certain things can be harmful while you're pregnant. It's best to avoid the following: Do not drink alcohol,smoke, vape, or use products with nicotine  or tobacco in them. If you need help quitting, talk with your provider. Avoid cat litter boxes and soil used by cats. These things carry germs that can cause harm to your pregnancy and your baby. General instructions Keep all follow-up visits. It helps you and your unborn baby stay as healthy as possible. Write down your questions. Take them to your prenatal visits. Your provider will: Talk with you about your overall health. Give you advice or refer you to specialists who can help with different needs,  including: Prenatal education classes. Mental health and counseling. Foods and healthy eating. Ask for help if you need help with food. Where to find more information American Pregnancy Association: americanpregnancy.org [employer] of Obstetricians and Gynecologists: acog.org Office on [employer] Health: TravelLesson.ca Contact a health care provider if: You have a headache that does not go away  when you take medicine. You have any of these problems: You can't eat or drink. You throw up or feel like you may throw up. You have watery poop (diarrhea) for 2 days or more. You have pain when you pee or your pee smells bad. You have been sick for 2 days or more and are not getting better. Contact your provider right away if: You have any of these coming from your vagina: Abnormal discharge. Bad-smelling fluid. Bleeding. Your baby is moving less than usual. You have contractions, belly cramping, or have pain in your pelvis or lower back. You have symptoms of high blood pressure or preeclampsia. These include: A severe, throbbing headache that does not go away. Sudden or extreme swelling of your face, hands, legs, or feet. Vision problems: You see spots. You have blurry vision. Your eyes are sensitive to light. If you can't reach the provider, go to an urgent care or emergency room. Get help right away if: You faint, become confused, or can't think clearly. You have chest pain or trouble breathing. You have any kind of injury, such as from a fall or a car crash. These symptoms may be an emergency. Call 911 right away. Do not wait to see if the symptoms will go away. Do not drive yourself to the hospital. This information is not intended to replace advice given to you by your health care provider. Make sure you discuss any questions you have with your health care provider. Document Revised: 05/02/2023 Document Reviewed: 11/30/2022 Elsevier Patient Education  2024 Elsevier Inc.Alpha-Fetoprotein Test: What to Know Why am I having this test? The alpha-fetoprotein (AFP) test is a blood test used to show if a pregnant person is at risk of carrying a baby with a congenital condition. A congenital condition is a problem that a baby is born with. The AFP test can be combined with other tests to look for these problems in the unborn baby: Genetic problems. Problems with the brain or  spinal cord. Belly problems. This test is used to see if your baby is at risk for having a congenital condition. It doesn't show if your baby actually has the condition. More testing will be needed to know for sure. The AFP test may also be done for males or nonpregnant people to check for certain cancers. What is being tested? This test measures the amount of AFP in your blood. AFP is a protein that's normally found in your blood starting in the 10th week of pregnancy. The level of AFP is highest at 16-18 weeks of pregnancy. AFP testing can be done between 15 and 20 weeks of pregnancy, but 16 to 18 weeks of pregnancy is the best time to do it. Certain cancers can cause a high level of AFP in both males and females. What kind of sample is taken?  A blood sample is needed for this test. It's usually taken by putting a needle into a blood vessel. How are the results reported? Your results will be compared with normal results for this test. Each lab has its own range for what's normal. Most lab reports include the normal ranges for each test and a  note telling you if yours is high or low. Normal results for the AFP test tend to be: Adult: Less than 40 ng/mL or less than 40 mcg/L (SI units). Child younger than 1 year: Less than 30 ng/mL. If you're pregnant, the values will change based on how far along you are. What do the results mean? If you're pregnant and your results are lower than expected, it can mean that your due date isn't right or that your baby may have trisomy 61, also known as Down syndrome. If you're pregnant and your results are higher than expected, it may mean: You're pregnant with more than one baby. Your due date is not right. Your baby may have: A problem with the wall of the belly. A problem with the spinal cord or brain. Your baby isn't getting enough oxygen, or the heart rate is too fast or too slow. Your baby has died before being born. Results that are higher than  expected in males or nonpregnant females may indicate: Cancer. Liver cell death. Talk with your health care provider about what your results mean. AFP testing is not used alone. Other testing will be needed. Questions to ask your health care provider Ask your provider, or the department that's doing the test: When will my results be ready? How will I get my results? What other tests do I need? What are my next steps? This information is not intended to replace advice given to you by your health care provider. Make sure you discuss any questions you have with your health care provider. Document Revised: 06/12/2023 Document Reviewed: 06/12/2023 Elsevier Patient Education  2025 [employer].

## 2024-06-25 ENCOUNTER — Encounter: Admitting: Certified Nurse Midwife

## 2024-06-25 DIAGNOSIS — O0990 Supervision of high risk pregnancy, unspecified, unspecified trimester: Secondary | ICD-10-CM

## 2024-06-25 DIAGNOSIS — Z1379 Encounter for other screening for genetic and chromosomal anomalies: Secondary | ICD-10-CM

## 2024-06-25 DIAGNOSIS — Z3689 Encounter for other specified antenatal screening: Secondary | ICD-10-CM

## 2024-07-24 DIAGNOSIS — Z419 Encounter for procedure for purposes other than remedying health state, unspecified: Secondary | ICD-10-CM | POA: Diagnosis not present

## 2024-08-04 ENCOUNTER — Encounter: Admitting: Obstetrics & Gynecology

## 2024-08-04 VITALS — BP 124/83 | HR 96 | Wt 224.0 lb

## 2024-08-04 DIAGNOSIS — O10919 Unspecified pre-existing hypertension complicating pregnancy, unspecified trimester: Secondary | ICD-10-CM

## 2024-08-04 DIAGNOSIS — O0990 Supervision of high risk pregnancy, unspecified, unspecified trimester: Secondary | ICD-10-CM

## 2024-08-04 DIAGNOSIS — O09299 Supervision of pregnancy with other poor reproductive or obstetric history, unspecified trimester: Secondary | ICD-10-CM

## 2024-08-04 DIAGNOSIS — O99210 Obesity complicating pregnancy, unspecified trimester: Secondary | ICD-10-CM | POA: Insufficient documentation

## 2024-08-04 DIAGNOSIS — Z3A21 21 weeks gestation of pregnancy: Secondary | ICD-10-CM | POA: Diagnosis not present

## 2024-08-04 DIAGNOSIS — R87612 Low grade squamous intraepithelial lesion on cytologic smear of cervix (LGSIL): Secondary | ICD-10-CM | POA: Diagnosis not present

## 2024-08-04 DIAGNOSIS — R7689 Other specified abnormal immunological findings in serum: Secondary | ICD-10-CM

## 2024-08-04 NOTE — Progress Notes (Signed)
 "  PRENATAL VISIT NOTE  Subjective:  Kristina Mccann is a 33 y.o. G3P2002 at [redacted]w[redacted]d being seen today for ongoing prenatal care.  She is currently monitored for the following issues for this high-risk pregnancy and has Acanthosis nigricans; Major depression, chronic (HCC); H/O suicide attempt; Seasonal allergic rhinitis; HSV-2 seropositive; Vitamin D  deficiency; Chronic hypertension affecting pregnancy; Essential hypertension; GAD (generalized anxiety disorder); Hyperlipidemia; Moderate episode of recurrent major depressive disorder (HCC); LGSIL on Pap smear of cervix; High risk HPV infection; Supervision of high risk pregnancy, antepartum; History of pre-eclampsia in prior pregnancy, currently pregnant; H/O cesarean section; and Obesity in pregnancy on their problem list.  Patient reports no complaints.  Contractions: Not present. Vag. Bleeding: None.  Movement: Present. Denies leaking of fluid.   The following portions of the patient's history were reviewed and updated as appropriate: allergies, current medications, past family history, past medical history, past social history, past surgical history and problem list.   Objective:   Vitals:   08/04/24 1559  BP: 124/83  Pulse: 96  Weight: 224 lb (101.6 kg)    Fetal Status:  Fetal Heart Rate (bpm): 146 Fundal Height: 24 cm Movement: Present    General: Alert, oriented and cooperative. Patient is in no acute distress.  Skin: Skin is warm and dry. No rash noted.   Cardiovascular: Normal heart rate noted  Respiratory: Normal respiratory effort, no problems with respiration noted  Abdomen: Soft, gravid, appropriate for gestational age.  Pain/Pressure: Absent     Pelvic: Cervical exam deferred        Extremities: Normal range of motion.     Mental Status: Normal mood and affect. Normal behavior. Normal judgment and thought content.      04/24/2024   10:28 AM 05/23/2023    9:48 AM 04/05/2023    3:11 PM  Depression screen PHQ 2/9  Decreased  Interest 0 1 1  Down, Depressed, Hopeless 0 1 1  PHQ - 2 Score 0 2 2  Altered sleeping 0 1 1  Tired, decreased energy 0 1 1  Change in appetite 0 1 3  Feeling bad or failure about yourself  0 0 3  Trouble concentrating 0 1 1  Moving slowly or fidgety/restless 0 0 1  Suicidal thoughts 0 0 0  PHQ-9 Score 0  6  12   Difficult doing work/chores Not difficult at all Not difficult at all Somewhat difficult     Data saved with a previous flowsheet row definition        04/24/2024   10:33 AM 05/23/2023    9:48 AM 04/05/2023    3:11 PM 02/25/2023    8:14 AM  GAD 7 : Generalized Anxiety Score  Nervous, Anxious, on Edge 1 1 1 1   Control/stop worrying 1 1 1 1   Worry too much - different things 1 1 1 1   Trouble relaxing 1 1 3 1   Restless 1 1 3 1   Easily annoyed or irritable 1 1 3 1   Afraid - awful might happen 1 1 1 1   Total GAD 7 Score 7 7 13 7   Anxiety Difficulty Somewhat difficult Somewhat difficult Somewhat difficult Somewhat difficult    Assessment and Plan:  Pregnancy: G3P2002 at [redacted]w[redacted]d 1. Supervision of high risk pregnancy, antepartum (Primary)  - US  MFM OB COMP + 14 WK; Future  2. History of pre-eclampsia in prior pregnancy, currently pregnant  - US  MFM OB COMP + 14 WK; Future - she has not started taking baby asa  3. LGSIL on Pap smear of cervix - she will need to schedule a colposcopy  4. HSV-2 seropositive - start valtrex  at 36 weeks  5. Obesity in pregnancy   6. Chronic hypertension affecting pregnancy - on procardia  60 mg daily  7. [redacted] weeks gestation of pregnancy  - US  MFM OB COMP + 14 WK; Future  Preterm labor symptoms and general obstetric precautions including but not limited to vaginal bleeding, contractions, leaking of fluid and fetal movement were reviewed in detail with the patient. Please refer to After Visit Summary for other counseling recommendations.   Return in about 3 weeks (around 08/25/2024).  No future appointments.  Harland JAYSON Birkenhead, MD  "

## 2024-08-08 ENCOUNTER — Other Ambulatory Visit: Payer: Self-pay

## 2024-08-08 ENCOUNTER — Emergency Department
Admission: EM | Admit: 2024-08-08 | Discharge: 2024-08-08 | Disposition: A | Discharge status: Home / Self Care | Attending: Emergency Medicine | Admitting: Emergency Medicine

## 2024-08-08 ENCOUNTER — Emergency Department

## 2024-08-08 DIAGNOSIS — Z3A22 22 weeks gestation of pregnancy: Secondary | ICD-10-CM | POA: Diagnosis not present

## 2024-08-08 DIAGNOSIS — J101 Influenza due to other identified influenza virus with other respiratory manifestations: Secondary | ICD-10-CM | POA: Insufficient documentation

## 2024-08-08 DIAGNOSIS — E871 Hypo-osmolality and hyponatremia: Secondary | ICD-10-CM | POA: Diagnosis not present

## 2024-08-08 DIAGNOSIS — O98512 Other viral diseases complicating pregnancy, second trimester: Secondary | ICD-10-CM | POA: Diagnosis not present

## 2024-08-08 DIAGNOSIS — E876 Hypokalemia: Secondary | ICD-10-CM | POA: Diagnosis not present

## 2024-08-08 DIAGNOSIS — R059 Cough, unspecified: Secondary | ICD-10-CM | POA: Diagnosis not present

## 2024-08-08 DIAGNOSIS — I10 Essential (primary) hypertension: Secondary | ICD-10-CM | POA: Diagnosis not present

## 2024-08-08 DIAGNOSIS — R1032 Left lower quadrant pain: Secondary | ICD-10-CM | POA: Diagnosis not present

## 2024-08-08 DIAGNOSIS — O99282 Endocrine, nutritional and metabolic diseases complicating pregnancy, second trimester: Secondary | ICD-10-CM | POA: Insufficient documentation

## 2024-08-08 DIAGNOSIS — O26892 Other specified pregnancy related conditions, second trimester: Secondary | ICD-10-CM | POA: Diagnosis present

## 2024-08-08 DIAGNOSIS — O99512 Diseases of the respiratory system complicating pregnancy, second trimester: Secondary | ICD-10-CM | POA: Diagnosis not present

## 2024-08-08 HISTORY — DX: Essential (primary) hypertension: I10

## 2024-08-08 LAB — CBC WITH DIFFERENTIAL/PLATELET
Abs Immature Granulocytes: 0.05 K/uL (ref 0.00–0.07)
Basophils Absolute: 0 K/uL (ref 0.0–0.1)
Basophils Relative: 0 %
Eosinophils Absolute: 0 K/uL (ref 0.0–0.5)
Eosinophils Relative: 0 %
HCT: 30.3 % — ABNORMAL LOW (ref 36.0–46.0)
Hemoglobin: 9.9 g/dL — ABNORMAL LOW (ref 12.0–15.0)
Immature Granulocytes: 1 %
Lymphocytes Relative: 5 %
Lymphs Abs: 0.3 K/uL — ABNORMAL LOW (ref 0.7–4.0)
MCH: 27.3 pg (ref 26.0–34.0)
MCHC: 32.7 g/dL (ref 30.0–36.0)
MCV: 83.5 fL (ref 80.0–100.0)
Monocytes Absolute: 0.4 K/uL (ref 0.1–1.0)
Monocytes Relative: 6 %
Neutro Abs: 6.1 K/uL (ref 1.7–7.7)
Neutrophils Relative %: 88 %
Platelets: 285 K/uL (ref 150–400)
RBC: 3.63 MIL/uL — ABNORMAL LOW (ref 3.87–5.11)
RDW: 14 % (ref 11.5–15.5)
WBC: 6.9 K/uL (ref 4.0–10.5)
nRBC: 0 % (ref 0.0–0.2)

## 2024-08-08 LAB — RESP PANEL BY RT-PCR (RSV, FLU A&B, COVID)  RVPGX2
Influenza A by PCR: POSITIVE — AB
Influenza B by PCR: NEGATIVE
Resp Syncytial Virus by PCR: NEGATIVE
SARS Coronavirus 2 by RT PCR: NEGATIVE

## 2024-08-08 LAB — URINALYSIS, ROUTINE W REFLEX MICROSCOPIC
Bacteria, UA: NONE SEEN
Bilirubin Urine: NEGATIVE
Glucose, UA: NEGATIVE mg/dL
Ketones, ur: 20 mg/dL — AB
Leukocytes,Ua: NEGATIVE
Nitrite: POSITIVE — AB
Protein, ur: 100 mg/dL — AB
Specific Gravity, Urine: 1.026 (ref 1.005–1.030)
pH: 5 (ref 5.0–8.0)

## 2024-08-08 LAB — HEPATIC FUNCTION PANEL
ALT: 11 U/L (ref 0–44)
AST: 33 U/L (ref 15–41)
Albumin: 3.5 g/dL (ref 3.5–5.0)
Alkaline Phosphatase: 85 U/L (ref 38–126)
Bilirubin, Direct: <0.1 mg/dL (ref 0.0–0.2)
Total Bilirubin: 0.3 mg/dL (ref 0.0–1.2)
Total Protein: 7.3 g/dL (ref 6.5–8.1)

## 2024-08-08 LAB — BASIC METABOLIC PANEL WITH GFR
Anion gap: 15 (ref 5–15)
BUN: <5 mg/dL — ABNORMAL LOW (ref 6–20)
CO2: 19 mmol/L — ABNORMAL LOW (ref 22–32)
Calcium: 8.3 mg/dL — ABNORMAL LOW (ref 8.9–10.3)
Chloride: 99 mmol/L (ref 98–111)
Creatinine, Ser: 0.59 mg/dL (ref 0.44–1.00)
GFR, Estimated: >60 mL/min
Glucose, Bld: 96 mg/dL (ref 70–99)
Potassium: 3.3 mmol/L — ABNORMAL LOW (ref 3.5–5.1)
Sodium: 132 mmol/L — ABNORMAL LOW (ref 135–145)

## 2024-08-08 MED ORDER — CEPHALEXIN 500 MG PO CAPS: 500.0000 mg | ORAL_CAPSULE | Freq: Three times a day (TID) | ORAL | 0 refills | Status: AC

## 2024-08-08 MED ORDER — ACETAMINOPHEN 325 MG PO TABS
650.0000 mg | ORAL_TABLET | Freq: Once | ORAL | Status: AC
  Administered 2024-08-08: 650 mg via ORAL
  Filled 2024-08-08: qty 2

## 2024-08-08 MED ORDER — ONDANSETRON HCL 4 MG/2ML IJ SOLN
4.0000 mg | Freq: Once | INTRAMUSCULAR | Status: AC
  Administered 2024-08-08: 4 mg via INTRAVENOUS
  Filled 2024-08-08: qty 2

## 2024-08-08 MED ORDER — SODIUM CHLORIDE 0.9 % IV BOLUS
1000.0000 mL | Freq: Once | INTRAVENOUS | Status: AC
  Administered 2024-08-08: 1000 mL via INTRAVENOUS

## 2024-08-08 NOTE — Discharge Instructions (Addendum)
 You tested positive for the flu today.  This is a viral illness which will resolve on its own with time.  You do not need an antibiotic.  You can take over-the-counter cold medicine as needed to manage your symptoms.  If you are taking combination cold medicine keep in mind that this often contains Tylenol  so if you need additional medication for body aches or fever control please take Motrin  or ibuprofen .  Your symptoms should resolve with time, if you have had symptoms for greater than 10 days please be evaluated by another healthcare provider as at this point it may have developed into a bacterial infection which requires a different treatment.  Your urinalysis did show presence of nitrites which can be seen with urinary tract infection.  Because you are pregnant we are going to treat this even though you are not having symptoms.  Your urine has been sent for culture to confirm presence of infection.  Please take the antibiotics as prescribed.  You will only receive a phone call if there needs to be a change in the medication.  Please continue to take your blood pressure medication as prescribed.  Your blood pressure was a little bit elevated today but did not meet criteria for preeclampsia.  I encourage you to have close follow-up with your OB/GYN.  Please reach out to them to determine when they would like to see you next.  Return to the emergency department with worsening symptoms.

## 2024-08-08 NOTE — ED Provider Notes (Signed)
 "  [facility] Provider Note    Event Date/Time   First MD Initiated Contact with Patient 08/08/24 1108     (approximate)   History   Cough and Generalized Body Aches   HPI  Kristina Mccann is a 33 y.o. female G3 P2-0-0-2 currently 22 weeks and 2 days pregnant with PMH of hypertension, depression and anxiety presents for evaluation of cough and generalized bodyaches that began on Christmas Day.  Patient is also here with her daughter who has similar symptoms.  She reports she has had a productive cough with yellow and green sputum.  She has had a few episodes of vomiting today.  She denies sore throat.  She has had some left lower abdominal pain but no vaginal bleeding or cramping.  Patient states that the pain is intermittent and does not feel like labor pains.      Physical Exam   Triage Vital Signs: ED Triage Vitals  Encounter Vitals Group     BP 08/08/24 1002 (!) 134/100     Girls Systolic BP Percentile --      Girls Diastolic BP Percentile --      Boys Systolic BP Percentile --      Boys Diastolic BP Percentile --      Pulse Rate 08/08/24 1002 (!) 123     Resp 08/08/24 1002 20     Temp 08/08/24 1002 100.1 F (37.8 C)     Temp Source 08/08/24 1002 Oral     SpO2 08/08/24 1002 97 %     Weight 08/08/24 1000 224 lb (101.6 kg)     Height 08/08/24 1000 5' 2 (1.575 m)     Head Circumference --      Peak Flow --      Pain Score 08/08/24 1002 9     Pain Loc --      Pain Education --      Exclude from Growth Chart --     Most recent vital signs: Vitals:   08/08/24 1526 08/08/24 1534  BP: (!) 124/56   Pulse: (!) 117   Resp:    Temp:  99 F (37.2 C)  SpO2: 94%    General: Awake, no distress.  CV:  Good peripheral perfusion.  RRR. Resp:  Normal effort.  CTAB. Abd:  No distention.  Gravid abdomen, tender to palpation in the left lower quadrant. Other:  Oral mucous membranes are moist.  No pharyngeal erythema, no tonsillar enlargement or  exudate.   ED Results / Procedures / Treatments   Labs (all labs ordered are listed, but only abnormal results are displayed) Labs Reviewed  RESP PANEL BY RT-PCR (RSV, FLU A&B, COVID)  RVPGX2 - Abnormal; Notable for the following components:      Result Value   Influenza A by PCR POSITIVE (*)    All other components within normal limits  CBC WITH DIFFERENTIAL/PLATELET - Abnormal; Notable for the following components:   RBC 3.63 (*)    Hemoglobin 9.9 (*)    HCT 30.3 (*)    Lymphs Abs 0.3 (*)    All other components within normal limits  BASIC METABOLIC PANEL WITH GFR - Abnormal; Notable for the following components:   Sodium 132 (*)    Potassium 3.3 (*)    CO2 19 (*)    BUN <5 (*)    Calcium  8.3 (*)    All other components within normal limits  URINALYSIS, ROUTINE W REFLEX MICROSCOPIC - Abnormal; Notable for  the following components:   Color, Urine AMBER (*)    APPearance HAZY (*)    Hgb urine dipstick LARGE (*)    Ketones, ur 20 (*)    Protein, ur 100 (*)    Nitrite POSITIVE (*)    All other components within normal limits  GROUP A STREP BY PCR  URINE CULTURE  HEPATIC FUNCTION PANEL    RADIOLOGY  Chest x-ray obtained, interpreted the images as well as reviewed the radiologist report which was negative for acute abnormalities.  PROCEDURES:  Critical Care performed: No  Procedures   MEDICATIONS ORDERED IN ED: Medications  sodium chloride  0.9 % bolus 1,000 mL (0 mLs Intravenous Stopped 08/08/24 1411)  acetaminophen  (TYLENOL ) tablet 650 mg (650 mg Oral Given 08/08/24 1210)  ondansetron  (ZOFRAN ) injection 4 mg (4 mg Intravenous Given 08/08/24 1427)     IMPRESSION / MDM / ASSESSMENT AND PLAN / ED COURSE  I reviewed the triage vital signs and the nursing notes.                             33 year old female who is [redacted] weeks pregnant presents for evaluation of cough and generalized bodyaches.  Patient was tachycardic and had an elevated blood pressure with  borderline temperature on initial assessment.  Differential diagnosis includes, but is not limited to, flu, COVID, RSV, electrolyte abnormality, dehydration, pneumonia, bronchitis.  Patient's presentation is most consistent with acute complicated illness / injury requiring diagnostic workup.  CBC shows mild anemia but is otherwise normal.  BMP shows hyponatremia and hypokalemia which I suspect is due to patient's vomiting.  Respiratory panel is positive for influenza A.  Chest x-ray is negative for pneumonia.  Since patient was tachycardic and had a borderline temperature on initial presentation we will give her some Tylenol .  Will also give her a fluid bolus as she is hyponatremic and likely dehydrated from the vomiting.  Will reassess for improvement after fluids and Tylenol .  I suspect that patient's abdominal pain is round ligament pain.  She is not having any cramping or bleeding.  I will reach out to her OB/GYN regarding this.  Patient was hypertensive and has a history of preeclampsia with her previous pregnancy.  She has had her blood pressure closely monitored by her OB/GYN.  She has been prescribed nifedipine  for treatment of this and did not take this medication this morning.  Low suspicion for preeclampsia today but will add on LFTs and UA for further evaluation.  Hepatic function panel is normal.  Urinalysis showed presence of hemoglobin, ketones, some protein and nitrites.  Suspect that the ketones may be due to decreased oral intake.  Although she does have proteinuria, this does not meet criteria for preeclampsia. She does have nitrites which I suspect is contaminate as there are not other findings consistent with UTI, however since she is pregnant we will plan to treat with antibiotics.  I will send urine for culture to confirm possible infection.  While patient did have elevated blood pressures today they did not meet criteria for preeclampsia.  I did advise very close follow-up with  her OB/GYN and patient voiced understanding.  Patient's pain and tachycardia did improve with fluid administration.  Do feel that is reasonable for her to be tachycardic given that she has the flu.  Did consider possibility of PE as a cause of her tachycardia but feel this is less likely.  Patient has not had any shortness  of breath, pleuritic chest pain, or hemoptysis.  All results were reviewed with the patient.  She was advised close follow-up with her primary care provider as well as her OB/GYN.  Patient voiced understanding.  All questions were answered and she was stable at discharge.  Clinical Course as of 08/08/24 1811  Sat Aug 08, 2024  1247 Spoke with the on-call [facility] provider given patient's reports of left lower quadrant pain.  Patient has not had any consistent cramping or bleeding so concern for preterm labor is low.  Since she does have an elevated blood pressure will do workup for preeclampsia including adding LFTs and UA to check for proteinuria. [LD]    Clinical Course User Index [LD] Cleaster Tinnie LABOR, PA-C     FINAL CLINICAL IMPRESSION(S) / ED DIAGNOSES   Final diagnoses:  Influenza A     Rx / DC Orders   ED Discharge Orders          Ordered    cephALEXin  (KEFLEX ) 500 MG capsule  3 times daily        08/08/24 1513             Note:  This document was prepared using Dragon voice recognition software and may include unintentional dictation errors.   Cleaster Tinnie LABOR, PA-C 08/08/24 ARLETHA Jacolyn Pae, MD 08/08/24 2020  "

## 2024-08-08 NOTE — ED Triage Notes (Signed)
 Pt to ED for cough, HA and body aches since 12/25. Pt is [redacted] wk pregnant. States coughing up yellow and green sputum. Respirations are unlabored.

## 2024-08-10 LAB — URINE CULTURE

## 2024-08-24 NOTE — Progress Notes (Unsigned)
" ° ° ° ° ° °  Return Prenatal Note   Subjective   34 y.o. [id number] at [redacted]w[redacted]d presents for this follow-up prenatal visit.  Patient has not yet had anatomy ultrasound, was ordered for MFM but ordered as complete and not detail-scheduled for tomorrow 1330. Patient reports: No concerns today.  Movement: Present Contractions: Irritability  Objective   Flow sheet Vitals: Pulse Rate: (!) 103 BP: 136/86 Fundal Height: 25 cm Fetal Heart Rate (bpm): 145 Total weight gain: 24 lb 1.6 oz (10.9 kg)  General Appearance  No acute distress, well appearing, and well nourished Pulmonary   Normal work of breathing Neurologic   Alert and oriented to person, place, and time Psychiatric   Mood and affect within normal limits   Assessment/Plan   Plan  34 y.o. [id number] at [redacted]w[redacted]d presents for follow-up OB visit. Reviewed prenatal record including previous visit note.  Chronic hypertension affecting pregnancy BP normotensive today. Reviewed q4w growth u/s beginning at 28w, ordered today. 28w labs with CMP & UP/C next visit. Antenatal testing beginning at 32w.  H/O cesarean section Desires VBAC. Prior births reviewed, has never experienced labor. Primary for HSV outbreak, 2nd after 24 hours of IOL, never dilated, had pitocin  but unable to have mechanical ripening due to only FT dilation.  Supervision of high risk pregnancy, antepartum Red flag symptoms & how to contact provider reviewed. Recommend physician visit for VBAC consent, delivery planning at 28-32w. Anatomy ultrasound scheduled for tomorrow at 1330.      Orders Placed This Encounter  Procedures   US  OB Comp + 14 Wk    Standing Status:   Future    Expected Date:   08/26/2024    Expiration Date:   08/25/2025    Reason for Exam (SYMPTOM  OR DIAGNOSIS REQUIRED):   anatomy    Preferred Imaging Location?:   Internal   US  OB Follow Up    Standing Status:   Future    Expected Date:   09/25/2024    Expiration Date:   08/25/2025    Reason for  exam::   growth    Preferred imaging location?:   Internal   28 Week RH+Panel    Standing Status:   Future    Expected Date:   09/25/2024    Expiration Date:   08/25/2025   Comprehensive metabolic panel with GFR    Standing Status:   Future    Expected Date:   09/25/2024    Expiration Date:   08/25/2025   Protein / creatinine ratio, urine    Standing Status:   Future    Expected Date:   09/25/2024    Expiration Date:   08/25/2025   Return in 4 weeks (on 09/22/2024) for ROB & GDM screening in 4w. Ultrasound for anatomy-ASAP; Ultrasound for growth in 4 weeks.   Future Appointments  Date Time Provider Department Center  08/26/2024  1:30 PM [facility] US  1 AOB-IMG None  09/25/2024  8:40 AM [facility]OBGY[facility] LAB [facility] None  09/25/2024  9:15 AM [facility] US  1 [facility]OB-IMG None  09/25/2024 10:15 AM Dominic, Jinnie Jansky, CNM [facility] None     For next visit:  ROB with 1 hour glucola, third trimester labs, and Tdap     Harlene LITTIE Cisco, CNM  1/13/20261:54 PM    "

## 2024-08-25 ENCOUNTER — Encounter: Payer: Self-pay | Admitting: Certified Nurse Midwife

## 2024-08-25 ENCOUNTER — Ambulatory Visit: Admitting: Certified Nurse Midwife

## 2024-08-25 VITALS — BP 136/86 | HR 103 | Wt 219.1 lb

## 2024-08-25 DIAGNOSIS — Z363 Encounter for antenatal screening for malformations: Secondary | ICD-10-CM

## 2024-08-25 DIAGNOSIS — Z131 Encounter for screening for diabetes mellitus: Secondary | ICD-10-CM | POA: Diagnosis not present

## 2024-08-25 DIAGNOSIS — Z98891 History of uterine scar from previous surgery: Secondary | ICD-10-CM | POA: Diagnosis not present

## 2024-08-25 DIAGNOSIS — D649 Anemia, unspecified: Secondary | ICD-10-CM

## 2024-08-25 DIAGNOSIS — O10919 Unspecified pre-existing hypertension complicating pregnancy, unspecified trimester: Secondary | ICD-10-CM

## 2024-08-25 DIAGNOSIS — Z113 Encounter for screening for infections with a predominantly sexual mode of transmission: Secondary | ICD-10-CM

## 2024-08-25 DIAGNOSIS — Z3A24 24 weeks gestation of pregnancy: Secondary | ICD-10-CM | POA: Diagnosis not present

## 2024-08-25 DIAGNOSIS — O0990 Supervision of high risk pregnancy, unspecified, unspecified trimester: Secondary | ICD-10-CM | POA: Diagnosis not present

## 2024-08-25 NOTE — Patient Instructions (Signed)
 Second Trimester of Pregnancy  The second trimester of pregnancy is from week 14 through week 27. This is months 4 through 6 of pregnancy. During the second trimester: Morning sickness is less or has stopped. You may have more energy. You may feel hungry more often. At this time, your unborn baby is growing very fast. At the end of the sixth month, the unborn baby may be up to 12 inches long and weigh about 1 pounds. You will likely start to feel the baby move between 16 and 20 weeks of pregnancy. Body changes during your second trimester Your body continues to change during this time. The changes usually go away after your baby is born. Physical changes You will gain more weight. Your belly will get bigger. You may begin to get stretch marks on your hips, belly, and breasts. Your breasts will keep growing and may hurt. You may get dark spots or blotches on your face. A dark line from your belly button to the pubic area may appear. This line is called linea nigra. Your hair may grow faster and get thicker. Health changes You may have headaches. You may have heartburn. You may pee more often. You may have swollen, bulging veins (varicose veins). You may have trouble pooping (constipation), or swollen veins in the butt that can itch or get painful (hemorrhoids). You may have back pain. This is caused by: Weight gain. Pregnancy hormones that are relaxing the joints in your pelvis. Follow these instructions at home: Medicines Talk to your health care provider if you're taking medicines. Ask if the medicines are safe to take during pregnancy. Your provider may change the medicines that you take. Do not take any medicines unless told to by your provider. Take a prenatal vitamin that has at least 600 micrograms (mcg) of folic acid. Do not use herbal medicines, illegal drugs, or medicines that are not approved by your provider. Eating and drinking While you're pregnant your body needs  extra food for your growing baby. Talk with your provider about what to eat while pregnant. Activity Most women are able to exercise during pregnancy. Exercises may need to change as your pregnancy goes on. Talk to your provider about your activities and exercise routines. Relieving pain and discomfort Wear a good, supportive bra if your breasts hurt. Rest with your legs raised if you have leg cramps or low back pain. Take warm sitz baths to soothe pain from hemorrhoids. Use hemorrhoid cream if your provider says it's okay. Do not douche. Do not use tampons or scented pads. Do not use hot tubs, steam rooms, or saunas. Safety Wear your seatbelt at all times when you're in a car. Talk to your provider if someone hits you, hurts you, or yells at you. Talk with your provider if you're feeling sad or have thoughts of hurting yourself. Lifestyle Certain things can be harmful while you're pregnant. It's best to avoid the following: Do not drink alcohol,smoke, vape, or use products with nicotine or tobacco in them. If you need help quitting, talk with your provider. Avoid cat litter boxes and soil used by cats. These things carry germs that can cause harm to your pregnancy and your baby. General instructions Keep all follow-up visits. It helps you and your unborn baby stay as healthy as possible. Write down your questions. Take them to your prenatal visits. Your provider will: Talk with you about your overall health. Give you advice or refer you to specialists who can help with different needs,  including: Prenatal education classes. Mental health and counseling. Foods and healthy eating. Ask for help if you need help with food. Where to find more information American Pregnancy Association: americanpregnancy.org [employer]: acog.org Office on [employer] Health: [employer] Contact a health care provider if: You have a headache that does not go away  when you take medicine. You have any of these problems: You can't eat or drink. You throw up or feel like you may throw up. You have watery poop (diarrhea) for 2 days or more. You have pain when you pee or your pee smells bad. You have been sick for 2 days or more and are not getting better. Contact your provider right away if: You have any of these coming from your vagina: Abnormal discharge. Bad-smelling fluid. Bleeding. Your baby is moving less than usual. You have contractions, belly cramping, or have pain in your pelvis or lower back. You have symptoms of high blood pressure or preeclampsia. These include: A severe, throbbing headache that does not go away. Sudden or extreme swelling of your face, hands, legs, or feet. Vision problems: You see spots. You have blurry vision. Your eyes are sensitive to light. If you can't reach the provider, go to an urgent care or emergency room. Get help right away if: You faint, become confused, or can't think clearly. You have chest pain or trouble breathing. You have any kind of injury, such as from a fall or a car crash. These symptoms may be an emergency. Call 911 right away. Do not wait to see if the symptoms will go away. Do not drive yourself to the hospital. This information is not intended to replace advice given to you by your health care provider. Make sure you discuss any questions you have with your health care provider. Document Revised: 05/02/2023 Document Reviewed: 11/30/2022 Elsevier Patient Education  2024 [employer].

## 2024-08-25 NOTE — Assessment & Plan Note (Signed)
 Red flag symptoms & how to contact provider reviewed. Recommend physician visit for VBAC consent, delivery planning at 28-32w. Anatomy ultrasound scheduled for tomorrow at 1330.

## 2024-08-25 NOTE — Assessment & Plan Note (Signed)
 BP normotensive today. Reviewed q4w growth u/s beginning at 28w, ordered today. 28w labs with CMP & UP/C next visit. Antenatal testing beginning at 32w.

## 2024-08-25 NOTE — Addendum Note (Signed)
 Addended by: TAFT CAMELIA MATSU on: 08/25/2024 12:35 PM   Modules accepted: Orders

## 2024-08-25 NOTE — Assessment & Plan Note (Signed)
 Desires VBAC. Prior births reviewed, has never experienced labor. Primary for HSV outbreak, 2nd after 24 hours of IOL, never dilated, had pitocin  but unable to have mechanical ripening due to only FT dilation.

## 2024-08-26 ENCOUNTER — Ambulatory Visit (INDEPENDENT_AMBULATORY_CARE_PROVIDER_SITE_OTHER)

## 2024-08-26 DIAGNOSIS — Z3A25 25 weeks gestation of pregnancy: Secondary | ICD-10-CM

## 2024-08-26 DIAGNOSIS — Z363 Encounter for antenatal screening for malformations: Secondary | ICD-10-CM

## 2024-09-02 ENCOUNTER — Ambulatory Visit: Payer: Self-pay | Admitting: Certified Nurse Midwife

## 2024-09-02 DIAGNOSIS — Z363 Encounter for antenatal screening for malformations: Secondary | ICD-10-CM

## 2024-09-25 ENCOUNTER — Encounter: Admitting: Licensed Practical Nurse

## 2024-09-25 ENCOUNTER — Other Ambulatory Visit
